# Patient Record
Sex: Female | Born: 1935
Health system: Southern US, Community
[De-identification: ages and names within clinical notes are randomized; demographics above are authoritative.]

## PROBLEM LIST (undated history)

## (undated) DIAGNOSIS — T8859XA Other complications of anesthesia, initial encounter: Secondary | ICD-10-CM

## (undated) DIAGNOSIS — G459 Transient cerebral ischemic attack, unspecified: Secondary | ICD-10-CM

## (undated) DIAGNOSIS — K219 Gastro-esophageal reflux disease without esophagitis: Secondary | ICD-10-CM

## (undated) DIAGNOSIS — H409 Unspecified glaucoma: Secondary | ICD-10-CM

## (undated) DIAGNOSIS — I10 Essential (primary) hypertension: Secondary | ICD-10-CM

## (undated) DIAGNOSIS — T4145XA Adverse effect of unspecified anesthetic, initial encounter: Secondary | ICD-10-CM

## (undated) DIAGNOSIS — H269 Unspecified cataract: Secondary | ICD-10-CM

## (undated) DIAGNOSIS — I639 Cerebral infarction, unspecified: Secondary | ICD-10-CM

## (undated) DIAGNOSIS — M1990 Unspecified osteoarthritis, unspecified site: Secondary | ICD-10-CM

## (undated) DIAGNOSIS — E119 Type 2 diabetes mellitus without complications: Secondary | ICD-10-CM

## (undated) HISTORY — DX: Transient cerebral ischemic attack, unspecified: G45.9

## (undated) HISTORY — PX: FRACTURE SURGERY: SHX138

## (undated) HISTORY — DX: Cerebral infarction, unspecified: I63.9

## (undated) HISTORY — PX: APPENDECTOMY: SHX54

---

## 1971-03-28 HISTORY — PX: VAGINAL HYSTERECTOMY: SUR661

## 1998-03-27 HISTORY — PX: ANKLE FRACTURE SURGERY: SHX122

## 2013-04-09 ENCOUNTER — Emergency Department: Payer: Self-pay | Admitting: Emergency Medicine

## 2013-04-09 LAB — URINALYSIS, COMPLETE
BACTERIA: NONE SEEN
BLOOD: NEGATIVE
Bilirubin,UR: NEGATIVE
Glucose,UR: 150 mg/dL (ref 0–75)
Ketone: NEGATIVE
NITRITE: NEGATIVE
Ph: 6 (ref 4.5–8.0)
Protein: NEGATIVE
Specific Gravity: 1.009 (ref 1.003–1.030)
Squamous Epithelial: 2
Transitional Epi: <1
WBC UR: 3 /HPF (ref 0–5)

## 2013-04-09 LAB — CBC
HCT: 44.4 % (ref 35.0–47.0)
HGB: 14.9 g/dL (ref 12.0–16.0)
MCH: 29.6 pg (ref 26.0–34.0)
MCHC: 33.5 g/dL (ref 32.0–36.0)
MCV: 89 fL (ref 80–100)
Platelet: 184 10*3/uL (ref 150–440)
RBC: 5.02 10*6/uL (ref 3.80–5.20)
RDW: 13.5 % (ref 11.5–14.5)
WBC: 4.9 10*3/uL (ref 3.6–11.0)

## 2013-04-09 LAB — COMPREHENSIVE METABOLIC PANEL
ALT: 21 U/L (ref 12–78)
ANION GAP: 4 — AB (ref 7–16)
AST: 28 U/L (ref 15–37)
Albumin: 4 g/dL (ref 3.4–5.0)
Alkaline Phosphatase: 74 U/L
BUN: 14 mg/dL (ref 7–18)
Bilirubin,Total: 0.5 mg/dL (ref 0.2–1.0)
CHLORIDE: 103 mmol/L (ref 98–107)
Calcium, Total: 9.2 mg/dL (ref 8.5–10.1)
Co2: 28 mmol/L (ref 21–32)
Creatinine: 0.87 mg/dL (ref 0.60–1.30)
EGFR (Non-African Amer.): >60
GLUCOSE: 211 mg/dL — AB (ref 65–99)
Osmolality: 277 (ref 275–301)
Potassium: 3.8 mmol/L (ref 3.5–5.1)
Sodium: 135 mmol/L — ABNORMAL LOW (ref 136–145)
Total Protein: 8.1 g/dL (ref 6.4–8.2)

## 2013-04-09 LAB — TROPONIN I

## 2015-11-04 ENCOUNTER — Emergency Department: Payer: Medicare Other

## 2015-11-04 ENCOUNTER — Inpatient Hospital Stay (HOSPITAL_COMMUNITY)
Admission: EM | Admit: 2015-11-04 | Discharge: 2015-11-08 | DRG: 065 | Disposition: A | Discharge status: Skilled Nursing Facility | Payer: Medicare Other | Attending: Internal Medicine | Admitting: Internal Medicine

## 2015-11-04 ENCOUNTER — Observation Stay (HOSPITAL_COMMUNITY): Payer: Medicare Other

## 2015-11-04 ENCOUNTER — Encounter (HOSPITAL_COMMUNITY): Payer: Self-pay | Admitting: Emergency Medicine

## 2015-11-04 ENCOUNTER — Emergency Department
Admission: EM | Admit: 2015-11-04 | Discharge: 2015-11-04 | Payer: Medicare Other | Attending: Emergency Medicine | Admitting: Emergency Medicine

## 2015-11-04 DIAGNOSIS — I639 Cerebral infarction, unspecified: Principal | ICD-10-CM

## 2015-11-04 DIAGNOSIS — Z888 Allergy status to other drugs, medicaments and biological substances status: Secondary | ICD-10-CM

## 2015-11-04 DIAGNOSIS — H409 Unspecified glaucoma: Secondary | ICD-10-CM | POA: Diagnosis present

## 2015-11-04 DIAGNOSIS — I10 Essential (primary) hypertension: Secondary | ICD-10-CM | POA: Diagnosis not present

## 2015-11-04 DIAGNOSIS — G8194 Hemiplegia, unspecified affecting left nondominant side: Secondary | ICD-10-CM | POA: Diagnosis not present

## 2015-11-04 DIAGNOSIS — G459 Transient cerebral ischemic attack, unspecified: Secondary | ICD-10-CM | POA: Diagnosis not present

## 2015-11-04 DIAGNOSIS — Z833 Family history of diabetes mellitus: Secondary | ICD-10-CM

## 2015-11-04 DIAGNOSIS — E119 Type 2 diabetes mellitus without complications: Secondary | ICD-10-CM

## 2015-11-04 DIAGNOSIS — Z8673 Personal history of transient ischemic attack (TIA), and cerebral infarction without residual deficits: Secondary | ICD-10-CM | POA: Diagnosis not present

## 2015-11-04 DIAGNOSIS — E785 Hyperlipidemia, unspecified: Secondary | ICD-10-CM | POA: Diagnosis not present

## 2015-11-04 DIAGNOSIS — R270 Ataxia, unspecified: Secondary | ICD-10-CM | POA: Diagnosis present

## 2015-11-04 DIAGNOSIS — R471 Dysarthria and anarthria: Secondary | ICD-10-CM | POA: Diagnosis present

## 2015-11-04 DIAGNOSIS — R2990 Unspecified symptoms and signs involving the nervous system: Secondary | ICD-10-CM

## 2015-11-04 DIAGNOSIS — Z8249 Family history of ischemic heart disease and other diseases of the circulatory system: Secondary | ICD-10-CM

## 2015-11-04 DIAGNOSIS — Z9119 Patient's noncompliance with other medical treatment and regimen: Secondary | ICD-10-CM | POA: Diagnosis not present

## 2015-11-04 DIAGNOSIS — I160 Hypertensive urgency: Secondary | ICD-10-CM | POA: Diagnosis present

## 2015-11-04 DIAGNOSIS — R001 Bradycardia, unspecified: Secondary | ICD-10-CM | POA: Diagnosis not present

## 2015-11-04 DIAGNOSIS — R200 Anesthesia of skin: Secondary | ICD-10-CM | POA: Diagnosis present

## 2015-11-04 DIAGNOSIS — Z885 Allergy status to narcotic agent status: Secondary | ICD-10-CM

## 2015-11-04 DIAGNOSIS — I6930 Unspecified sequelae of cerebral infarction: Secondary | ICD-10-CM | POA: Insufficient documentation

## 2015-11-04 HISTORY — DX: Unspecified osteoarthritis, unspecified site: M19.90

## 2015-11-04 HISTORY — DX: Other complications of anesthesia, initial encounter: T88.59XA

## 2015-11-04 HISTORY — DX: Unspecified glaucoma: H40.9

## 2015-11-04 HISTORY — DX: Transient cerebral ischemic attack, unspecified: G45.9

## 2015-11-04 HISTORY — DX: Gastro-esophageal reflux disease without esophagitis: K21.9

## 2015-11-04 HISTORY — DX: Type 2 diabetes mellitus without complications: E11.9

## 2015-11-04 HISTORY — DX: Unspecified cataract: H26.9

## 2015-11-04 HISTORY — DX: Adverse effect of unspecified anesthetic, initial encounter: T41.45XA

## 2015-11-04 HISTORY — DX: Essential (primary) hypertension: I10

## 2015-11-04 LAB — CBC WITH DIFFERENTIAL/PLATELET
Basophils Absolute: 0 10*3/uL (ref 0–0.1)
Basophils Relative: 1 %
EOS PCT: 2 %
Eosinophils Absolute: 0.1 10*3/uL (ref 0–0.7)
HEMATOCRIT: 44.9 % (ref 35.0–47.0)
Hemoglobin: 15.4 g/dL (ref 12.0–16.0)
Lymphocytes Relative: 24 %
Lymphs Abs: 1.5 10*3/uL (ref 1.0–3.6)
MCH: 30.1 pg (ref 26.0–34.0)
MCHC: 34.3 g/dL (ref 32.0–36.0)
MCV: 87.9 fL (ref 80.0–100.0)
MONO ABS: 0.5 10*3/uL (ref 0.2–0.9)
Monocytes Relative: 9 %
Neutro Abs: 4 10*3/uL (ref 1.4–6.5)
Neutrophils Relative %: 64 %
PLATELETS: 165 10*3/uL (ref 150–440)
RBC: 5.11 MIL/uL (ref 3.80–5.20)
RDW: 14 % (ref 11.5–14.5)
WBC: 6.2 10*3/uL (ref 3.6–11.0)

## 2015-11-04 LAB — COMPREHENSIVE METABOLIC PANEL
ALT: 15 U/L (ref 14–54)
AST: 20 U/L (ref 15–41)
Albumin: 4.3 g/dL (ref 3.5–5.0)
Alkaline Phosphatase: 72 U/L (ref 38–126)
Anion gap: 6 (ref 5–15)
BILIRUBIN TOTAL: 0.9 mg/dL (ref 0.3–1.2)
BUN: 13 mg/dL (ref 6–20)
CHLORIDE: 103 mmol/L (ref 101–111)
CO2: 30 mmol/L (ref 22–32)
CREATININE: 0.85 mg/dL (ref 0.44–1.00)
Calcium: 9.8 mg/dL (ref 8.9–10.3)
Glucose, Bld: 199 mg/dL — ABNORMAL HIGH (ref 65–99)
Potassium: 5 mmol/L (ref 3.5–5.1)
Sodium: 139 mmol/L (ref 135–145)
TOTAL PROTEIN: 7.9 g/dL (ref 6.5–8.1)

## 2015-11-04 LAB — PROTIME-INR
INR: 1.01
Prothrombin Time: 13.3 seconds (ref 11.4–15.2)

## 2015-11-04 LAB — ETHANOL: Alcohol, Ethyl (B): <5 mg/dL (ref <5)

## 2015-11-04 LAB — GLUCOSE, CAPILLARY
GLUCOSE-CAPILLARY: 148 mg/dL — AB (ref 65–99)
Glucose-Capillary: 159 mg/dL — ABNORMAL HIGH (ref 65–99)
Glucose-Capillary: 174 mg/dL — ABNORMAL HIGH (ref 65–99)

## 2015-11-04 LAB — APTT: APTT: 31 s (ref 24–36)

## 2015-11-04 LAB — I-STAT CHEM 8, ED
BUN: 10 mg/dL (ref 6–20)
CREATININE: 0.7 mg/dL (ref 0.44–1.00)
Calcium, Ion: 1.16 mmol/L (ref 1.12–1.23)
Chloride: 102 mmol/L (ref 101–111)
Glucose, Bld: 191 mg/dL — ABNORMAL HIGH (ref 65–99)
HEMATOCRIT: 45 % (ref 36.0–46.0)
Hemoglobin: 15.3 g/dL — ABNORMAL HIGH (ref 12.0–15.0)
POTASSIUM: 3.9 mmol/L (ref 3.5–5.1)
Sodium: 140 mmol/L (ref 135–145)
TCO2: 26 mmol/L (ref 0–100)

## 2015-11-04 LAB — I-STAT TROPONIN, ED: Troponin i, poc: 0.01 ng/mL (ref 0.00–0.08)

## 2015-11-04 MED ORDER — LABETALOL HCL 5 MG/ML IV SOLN
40.0000 mg | Freq: Once | INTRAVENOUS | Status: AC
  Administered 2015-11-04: 40 mg via INTRAVENOUS

## 2015-11-04 MED ORDER — STROKE: EARLY STAGES OF RECOVERY BOOK
Freq: Once | Status: AC
  Administered 2015-11-05: 17:00:00
  Filled 2015-11-04 (×2): qty 1

## 2015-11-04 MED ORDER — ASPIRIN 81 MG PO CHEW
CHEWABLE_TABLET | ORAL | Status: AC
  Administered 2015-11-04: 324 mg via ORAL
  Filled 2015-11-04: qty 4

## 2015-11-04 MED ORDER — ACETAMINOPHEN 650 MG RE SUPP: 650.0000 mg | RECTAL | Status: DC | PRN

## 2015-11-04 MED ORDER — LABETALOL HCL 5 MG/ML IV SOLN
INTRAVENOUS | Status: AC
  Administered 2015-11-04: 40 mg via INTRAVENOUS
  Filled 2015-11-04: qty 8

## 2015-11-04 MED ORDER — ENOXAPARIN SODIUM 40 MG/0.4ML ~~LOC~~ SOLN
40.0000 mg | SUBCUTANEOUS | Status: DC
  Administered 2015-11-04 – 2015-11-07 (×4): 40 mg via SUBCUTANEOUS
  Filled 2015-11-04 (×4): qty 0.4

## 2015-11-04 MED ORDER — INSULIN ASPART 100 UNIT/ML ~~LOC~~ SOLN: 0.0000 [IU] | Freq: Every day | SUBCUTANEOUS | Status: DC

## 2015-11-04 MED ORDER — INSULIN ASPART 100 UNIT/ML ~~LOC~~ SOLN
0.0000 [IU] | Freq: Three times a day (TID) | SUBCUTANEOUS | Status: DC
  Administered 2015-11-04 – 2015-11-06 (×5): 3 [IU] via SUBCUTANEOUS
  Administered 2015-11-06: 1 [IU] via SUBCUTANEOUS
  Administered 2015-11-07 (×3): 2 [IU] via SUBCUTANEOUS
  Administered 2015-11-08 (×2): 3 [IU] via SUBCUTANEOUS
  Administered 2015-11-08: 2 [IU] via SUBCUTANEOUS

## 2015-11-04 MED ORDER — HYDRALAZINE HCL 20 MG/ML IJ SOLN: 10.0000 mg | INTRAMUSCULAR | Status: DC | PRN

## 2015-11-04 MED ORDER — ASPIRIN 325 MG PO TABS: 325.0000 mg | ORAL_TABLET | Freq: Every day | ORAL | Status: DC

## 2015-11-04 MED ORDER — ACETAMINOPHEN 325 MG PO TABS
650.0000 mg | ORAL_TABLET | ORAL | Status: DC | PRN
  Administered 2015-11-05: 650 mg via ORAL
  Filled 2015-11-04: qty 2

## 2015-11-04 MED ORDER — SENNOSIDES-DOCUSATE SODIUM 8.6-50 MG PO TABS: 1.0000 | ORAL_TABLET | Freq: Every evening | ORAL | Status: DC | PRN

## 2015-11-04 MED ORDER — LABETALOL HCL 5 MG/ML IV SOLN
80.0000 mg | Freq: Once | INTRAVENOUS | Status: AC
  Administered 2015-11-04: 80 mg via INTRAVENOUS
  Filled 2015-11-04: qty 16

## 2015-11-04 MED ORDER — LABETALOL HCL 5 MG/ML IV SOLN
INTRAVENOUS | Status: AC
  Administered 2015-11-04: 20 mg
  Filled 2015-11-04: qty 4

## 2015-11-04 MED ORDER — ASPIRIN 81 MG PO CHEW
324.0000 mg | CHEWABLE_TABLET | Freq: Once | ORAL | Status: AC
  Administered 2015-11-04: 324 mg via ORAL

## 2015-11-04 MED ORDER — IOPAMIDOL (ISOVUE-370) INJECTION 76%
75.0000 mL | Freq: Once | INTRAVENOUS | Status: AC | PRN
  Administered 2015-11-04: 75 mL via INTRAVENOUS

## 2015-11-04 NOTE — ED Triage Notes (Addendum)
Pt comes into the ED via EMS from home with c/o left arm numbness since initially waking at 430am to go to the BR, states she felt fine when she went to bed last night at 1130pm.. Pt hypertensive with EMS 211/110, CBG 168.Marland Kitchen. Pt also c/o having difficulty walking "staggering"..Marland Kitchen

## 2015-11-04 NOTE — ED Notes (Signed)
Admitting at bedside 

## 2015-11-04 NOTE — ED Notes (Signed)
[facility]

## 2015-11-04 NOTE — Code Documentation (Signed)
82yo [facility] via [facility] as a transfer from [facility].  Patient was at her baseline when she went to bed last night at 2300.  Patient woke up at 0430 to use the bathroom when she had unsteady gait and left sided weakness.  [facility] which showed proximal right PCA occlusion and a chronic appearing right occipital pole infarct.  [facility]  Stroke team at the bedside on patient arrival.  NIHSS 0, see documentation for details and code stroke times.  Patient reporting left neck numbness.  No acute stroke intervention at this time.  Handoff with ED RN Tammy SoursGreg.

## 2015-11-04 NOTE — ED Provider Notes (Signed)
[facility]-EMERGENCY DEPT Provider Note   CSN: [id number] Arrival date & time: 11/04/15  1229  First Provider Contact:  First MD Initiated Contact with Patient 11/04/15 1237        History   Chief Complaint Chief Complaint  Patient presents with  . Stroke Symptoms    HPI Margaret Mcintosh is a 82 y.o. female.  HPI 82 year old female with past medical history of hypertension and diabetes who presents as a transfer from outside hospital for evaluation of stroke. The patient states her last known normal was last night when she went to bed. When she woke this morning she had difficulty walking to the bathroom due to left arm and leg weakness. [facility] these [facility]. At [facility], the patient underwent CT and CTA which showed a likely chronic PCA occlusion. She was subsequent send here for evaluation. Of note, it is unclear whether neurology was consulted prior to transfer. Currently, the patient denies any complaints. Her weakness in her arm and leg have improved. She denies any visual changes. Denies any recent trauma. No recent fevers or chills. Denies any chest pain  Past Medical History:  Diagnosis Date  . Arthritis   . Cataract   . Diabetes mellitus (HCC)   . Glaucoma   . Hypertension     Patient Active Problem List   Diagnosis Date Noted  . TIA (transient ischemic attack) 11/04/2015  . Stroke-like symptoms 11/04/2015  . Diabetes (HCC) 11/04/2015  . Bradycardia 11/04/2015  . Hypertension 11/04/2015  . Acute CVA (cerebrovascular accident) (HCC)   . Accelerated hypertension     Past Surgical History:  Procedure Laterality Date  . ABDOMINAL HYSTERECTOMY    . APPENDECTOMY    . FRACTURE SURGERY      OB History    No data available       Home Medications    Prior to Admission medications   Medication Sig Start Date End Date Taking? Authorizing Provider  acetaminophen (TYLENOL) 325 MG tablet Take 325-650 mg by mouth every 6 (six) hours as  needed for mild pain.   Yes Historical Provider, MD  ibuprofen (ADVIL,MOTRIN) 400 MG tablet Take 400 mg by mouth every 6 (six) hours as needed for mild pain.   Yes Historical Provider, MD    Family History Family History  Problem Relation Age of Onset  . Hypertension Mother   . Hypertension Father     Social History Social History  Substance Use Topics  . Smoking status: Never Smoker  . Smokeless tobacco: Never Used  . Alcohol use No     Allergies   Codeine; Diphenhydramine hcl; and Other   Review of Systems Review of Systems  Constitutional: Negative for chills, fatigue and fever.  HENT: Negative for congestion and rhinorrhea.   Eyes: Negative for visual disturbance.  Respiratory: Negative for cough, shortness of breath and wheezing.   Cardiovascular: Negative for chest pain and leg swelling.  Gastrointestinal: Negative for abdominal pain, diarrhea, nausea and vomiting.  Genitourinary: Negative for dysuria and flank pain.  Musculoskeletal: Positive for gait problem. Negative for neck pain and neck stiffness.  Skin: Negative for rash and wound.  Allergic/Immunologic: Negative for immunocompromised state.  Neurological: Positive for weakness. Negative for syncope and headaches.     Physical Exam Updated Vital Signs BP (!) 183/63 (BP Location: Left Arm)   Pulse (!) 58   Temp 98.2 F (36.8 C) (Oral)   Resp 20   SpO2 96%   Physical Exam  Constitutional: She is oriented to person, place, and time. She appears well-developed and well-nourished. No distress.  HENT:  Head: Normocephalic and atraumatic.  Mouth/Throat: Oropharynx is clear and moist.  Eyes: Conjunctivae are normal. Pupils are equal, round, and reactive to light.  Neck: Neck supple.  Cardiovascular: Normal rate, regular rhythm and normal heart sounds.  Exam reveals no friction rub.   No murmur heard. Pulmonary/Chest: Effort normal and breath sounds normal. No respiratory distress. She has no wheezes. She  has no rales.  Abdominal: She exhibits no distension. There is no tenderness.  Musculoskeletal: She exhibits no edema.  Neurological: She is alert and oriented to person, place, and time. She exhibits normal muscle tone.  Skin: Skin is warm. Capillary refill takes less than 2 seconds.  Nursing note and vitals reviewed.   Neurological Exam:  Mental Status: Alert and oriented to person, place, and time. Attention and concentration normal. Speech clear. Recent memory is intact. Cranial Nerves: Visual fields intact to confrontation in all quadrants bilaterally. EOMI and PERRLA. No nystagmus noted. Facial sensation intact at forehead, maxillary cheek, and chin/mandible bilaterally. No weakness of masticatory muscles. No facial asymmetry or weakness. Hearing grossly normal to finer rub. Uvula is midline, and palate elevates symmetrically. Normal SCM and trapezius strength. Tongue midline without fasciculations Motor: Muscle strength 5/5 in proximal and distal UE and LE bilaterally. No pronator drift. Muscle tone normal. Reflexes: 2+ and symmetrical in all four extremities.  Sensation: Intact to light touch in upper and lower extremities distally bilaterally.  Gait: Normal without ataxia. Coordination: Normal FTN bilaterally.     ED Treatments / Results  Labs (all labs ordered are listed, but only abnormal results are displayed) Labs Reviewed  GLUCOSE, CAPILLARY - Abnormal; Notable for the following:       Result Value   Glucose-Capillary 159 (*)    All other components within normal limits  I-STAT CHEM 8, ED - Abnormal; Notable for the following:    Glucose, Bld 191 (*)    Hemoglobin 15.3 (*)    All other components within normal limits  ETHANOL  PROTIME-INR  APTT  URINE RAPID DRUG SCREEN, HOSP PERFORMED  URINALYSIS, ROUTINE W REFLEX MICROSCOPIC (NOT AT [facility])  HEMOGLOBIN A1C  LIPID PANEL  I-STAT TROPOININ, ED    EKG  EKG Interpretation  Date/Time:  Thursday November 04 2015  12:47:58 EDT Ventricular Rate:  63 PR Interval:    QRS Duration: 84 QT Interval:  445 QTC Calculation: 456 R Axis:   36 Text Interpretation:  Sinus rhythm No significant change since last tracing Confirmed by Michale Emmerich MD, Sheria Lang [phone]) on 11/04/2015 12:51:58 PM Also confirmed by Erma Heritage MD, Keymani Mclean [phone]), editor Stout CT, Jola Babinski [phone])  on 11/04/2015 1:15:09 PM       Radiology Ct Angio Head W Or Wo Contrast  Result Date: 11/04/2015 CLINICAL DATA:  Left-sided numbness. EXAM: CT ANGIOGRAPHY HEAD AND NECK TECHNIQUE: Multidetector CT imaging of the head and neck was performed using the standard protocol during bolus administration of intravenous contrast. Multiplanar CT image reconstructions and MIPs were obtained to evaluate the vascular anatomy. Carotid stenosis measurements (when applicable) are obtained utilizing NASCET criteria, using the distal internal carotid diameter as the denominator. CONTRAST:  75 cc Isovue 370 intravenous COMPARISON:  Noncontrast head CT from earlier today FINDINGS: CTA NECK Aortic arch: Atheromatous calcifications. No aneurysm or dissection. 3 vessel branching. Right carotid system: Mixed calcified and noncalcified plaque at the bifurcation with 20% maximal proximal ICA stenosis. Left carotid system: Atherosclerotic  calcification primarily at the common carotid bifurcation which is mixed calcified and low-density. Stenosis measures up to 50% when measured coronally at the ICA origin. There is a low-density luminal filling defect along the posterior and lateral walls which appears to project into the lumen, as seen with thrombus. No evidence of dissection. Vertebral arteries:No proximal subclavian artery stenosis. Smooth and widely patent vertebral arteries with mild dominance on the left. Skeleton: No acute or aggressive process. Cervical disc and facet degeneration. Other neck: No incidental mass or adenopathy. Upper chest: Clear apical lungs CTA HEAD Anterior circulation:  Carotid siphon atherosclerotic luminal irregularity and calcification. Mild to moderate right mid cavernous segment narrowing. No major branch occlusion. There is diffuse atherosclerotic irregularity of medium size vessels. Negative for aneurysm. Posterior circulation: Mild left vertebral artery dominance. Standard vertebrobasilar branching. There is a critical right P1 segment stenosis with occluded PCA near the P1 2 junction (no post communicating artery). Negative for aneurysm. Venous sinuses: Patent Anatomic variants: Anterior communicating artery. No visible posterior communicating arteries. Delayed phase: Remote appearing right occipital pole infarct. As noted previously there may be more recent marginal ischemia. No visible thalamic infarct. Likely chronic pontomidbrain junction lacune. These results were called by telephone at the time of interpretation on 11/04/2015 at 10:36 am to Dr. Lacretia Nicks , who verbally acknowledged these results. IMPRESSION: 1. Proximal right PCA occlusion. There is a chronic appearing right occipital pole infarct; the level of occlusion puts a much greater area at risk. 2. Cervical carotid atherosclerosis with proximal ICA stenosis of approximately 20% on the right and 50% on the left. Low-density thrombus or irregular plaque projects into the proximal left ICA lumen. 3. Diffuse intracranial atherosclerosis. Electronically Signed   By: Marnee Spring M.D.   On: 11/04/2015 10:45   Dg Chest 2 View  Result Date: 11/04/2015 CLINICAL DATA:  TIA, left-sided weakness EXAM: CHEST  2 VIEW COMPARISON:  None. FINDINGS: Cardiomediastinal silhouette is unremarkable. No acute infiltrate or pleural effusion. No pulmonary edema. Atherosclerotic calcifications of carotid siphon. Degenerative changes mid and lower thoracic spine. IMPRESSION: No active cardiopulmonary disease. Electronically Signed   By: Natasha Mead M.D.   On: 11/04/2015 14:30   Ct Head Wo Contrast  Result Date:  11/04/2015 CLINICAL DATA:  Code stroke EXAM: CT HEAD WITHOUT CONTRAST TECHNIQUE: Contiguous axial images were obtained from the base of the skull through the vertex without intravenous contrast. COMPARISON:  04/09/2013 FINDINGS: No skull fracture is noted. Paranasal sinuses and mastoid air cells are unremarkable. No intracranial hemorrhage, mass effect or midline shift. Mild cerebral atrophy. Mild periventricular white matter decreased attenuation probable due to chronic small vessel ischemic changes. Bilateral basal ganglia punctate calcifications are again noted. There is interval old appearing infarct in right occipital lobe axial image 17 measures 1.6 cm. Axial image 15 there is small area of decreased attenuation in right occipital lobe. New ischemia cannot be excluded. Clinical correlation is necessary. Further correlation with MRI is recommended as clinically warranted. IMPRESSION: There is interval old appearing infarct in right occipital lobe axial image 17 measures 1.6 cm. Axial image 15 there is small area of decreased attenuation in right occipital lobe. New ischemia cannot be excluded. Clinical correlation is necessary. Further correlation with MRI is recommended as clinically warranted. No intracranial hemorrhage, mass effect or midline shift. These results were called by telephone at the time of interpretation on 11/04/2015 at 9:01 am to Dr. Lacretia Nicks , who verbally acknowledged these results. Electronically Signed   By: Lang Snow  Pop M.D.   On: 11/04/2015 09:02   Ct Angio Neck W And/or Wo Contrast  Result Date: 11/04/2015 CLINICAL DATA:  Left-sided numbness. EXAM: CT ANGIOGRAPHY HEAD AND NECK TECHNIQUE: Multidetector CT imaging of the head and neck was performed using the standard protocol during bolus administration of intravenous contrast. Multiplanar CT image reconstructions and MIPs were obtained to evaluate the vascular anatomy. Carotid stenosis measurements (when applicable) are obtained  utilizing NASCET criteria, using the distal internal carotid diameter as the denominator. CONTRAST:  75 cc Isovue 370 intravenous COMPARISON:  Noncontrast head CT from earlier today FINDINGS: CTA NECK Aortic arch: Atheromatous calcifications. No aneurysm or dissection. 3 vessel branching. Right carotid system: Mixed calcified and noncalcified plaque at the bifurcation with 20% maximal proximal ICA stenosis. Left carotid system: Atherosclerotic calcification primarily at the common carotid bifurcation which is mixed calcified and low-density. Stenosis measures up to 50% when measured coronally at the ICA origin. There is a low-density luminal filling defect along the posterior and lateral walls which appears to project into the lumen, as seen with thrombus. No evidence of dissection. Vertebral arteries:No proximal subclavian artery stenosis. Smooth and widely patent vertebral arteries with mild dominance on the left. Skeleton: No acute or aggressive process. Cervical disc and facet degeneration. Other neck: No incidental mass or adenopathy. Upper chest: Clear apical lungs CTA HEAD Anterior circulation: Carotid siphon atherosclerotic luminal irregularity and calcification. Mild to moderate right mid cavernous segment narrowing. No major branch occlusion. There is diffuse atherosclerotic irregularity of medium size vessels. Negative for aneurysm. Posterior circulation: Mild left vertebral artery dominance. Standard vertebrobasilar branching. There is a critical right P1 segment stenosis with occluded PCA near the P1 2 junction (no post communicating artery). Negative for aneurysm. Venous sinuses: Patent Anatomic variants: Anterior communicating artery. No visible posterior communicating arteries. Delayed phase: Remote appearing right occipital pole infarct. As noted previously there may be more recent marginal ischemia. No visible thalamic infarct. Likely chronic pontomidbrain junction lacune. These results were called  by telephone at the time of interpretation on 11/04/2015 at 10:36 am to Dr. Lacretia Nicks , who verbally acknowledged these results. IMPRESSION: 1. Proximal right PCA occlusion. There is a chronic appearing right occipital pole infarct; the level of occlusion puts a much greater area at risk. 2. Cervical carotid atherosclerosis with proximal ICA stenosis of approximately 20% on the right and 50% on the left. Low-density thrombus or irregular plaque projects into the proximal left ICA lumen. 3. Diffuse intracranial atherosclerosis. Electronically Signed   By: Marnee Spring M.D.   On: 11/04/2015 10:45    Procedures Procedures (including critical care time)  Medications Ordered in ED Medications   stroke: mapping our early stages of recovery book (not administered)  senna-docusate (Senokot-S) tablet 1 tablet (not administered)  enoxaparin (LOVENOX) injection 40 mg (not administered)  acetaminophen (TYLENOL) tablet 650 mg (not administered)    Or  acetaminophen (TYLENOL) suppository 650 mg (not administered)  insulin aspart (novoLOG) injection 0-15 Units (not administered)  insulin aspart (novoLOG) injection 0-5 Units (not administered)  aspirin tablet 325 mg (0 mg Oral Hold 11/04/15 1435)     Initial Impression / Assessment and Plan / ED Course  I have reviewed the triage vital signs and the nursing notes.  Pertinent labs & imaging results that were available during my care of the patient were reviewed by me and considered in my medical decision making (see chart for details).  Clinical Course   82 year old female with past medical history of hypertension and  diabetes who presents as a transfer from outside hospital for evaluation of possible stroke. On arrival, pt hypertensive but o/w well-appearing, with resolution of neurological sx. Stroke team and Dr. Hilda Blades at bedside and do not recommend repeat imaging/activation of CODE STROKE. Pt is outside intervention window and has  resolving/resolved sx. Will admit to Hospitalist for tele, stroke work-up and further management. BP now improved. Pt in agreement. Screening labwork sent and is otherwise unremarkable.  Final Clinical Impressions(s) / ED Diagnoses   Final diagnoses:  TIA (transient ischemic attack)  TIA (transient ischemic attack)    New Prescriptions Current Discharge Medication List       Shaune Pollack, MD 11/04/15 1806

## 2015-11-04 NOTE — Progress Notes (Signed)
Patient off floor. Will come back once patient in room

## 2015-11-04 NOTE — ED Notes (Signed)
Stroke team at bedside

## 2015-11-04 NOTE — H&P (Signed)
History and Physical    Margaret Mcintosh ZOX:[id number] DOB: [birth year] DOA: 11/04/2015  PCP: No PCP Per Patient Patient coming from: [place] [facility]/home  Chief Complaint: left sided weakness/gait disturbance  HPI: Margaret Mcintosh is a very pleasnat 82 y.o. female with medical history significant hypertension, diabetes, presents to the emergency Department chief complaint left-sided weakness/gait disturbance. She was transported here from [facility] where initial evaluation concerning for stroke/TIA.  Information is obtained from the patient and the chart. She reports being in her usual state of health last night around 11 PM when she went to bed. She awakened around 4:30 this morning to go to the restroom and noticed she was "off balance" and veering off to the left. She reports she "staggered" back to bed and awakened around 7 AM. She began to take a shower and noted she remained off balance and also noted that her left arm was quite weak. She reports having difficulty cleaning her ear is impression of her teeth. Associated symptoms include left leg "heaviness". She denies headache visual disturbances numbness tingling of extremities. She denies any difficulty chewing or swallowing. She denies chest pain palpitation shortness of breath diaphoresis nausea vomiting. She denies any dysuria hematuria frequency or urgency. She denies any recent travel fever chills or sick contacts. She reports her arm continues to feel a little "heavy" but otherwise she feels like symptoms have resolved  At [facility] code stroke protocol initiated with head CT showing no evidence of hemorrhagic stroke. CT angiogram of the head showed what appeared to be a stricture or obstruction at the posterior communicating artery. Seems to be some confusion whether this was acute or chronic per notes. Case discussed with interventional radiology at [facility] as well as ED at time he was decided [facility]up. It was noted she is not a TPA candidate. By the time she arrived at [facility] her symptoms much improved  ED Course: She is afebrile hemodynamically stable and not hypoxic.  Review of Systems: As per HPI otherwise 10 point review of systems negative.   Ambulatory Status: With steady gait independently no recent falls  Past Medical History:  Diagnosis Date  . Arthritis   . Cataract   . Diabetes mellitus (HCC)   . Glaucoma   . Hypertension     Past Surgical History:  Procedure Laterality Date  . ABDOMINAL HYSTERECTOMY    . APPENDECTOMY    . FRACTURE SURGERY      Social History   Social History  . Marital status: Married    Spouse name: N/A  . Number of children: N/A  . Years of education: N/A   Occupational History  . Not on file.   Social History Main Topics  . Smoking status: Never Smoker  . Smokeless tobacco: Never Used  . Alcohol use No  . Drug use: No  . Sexual activity: Not on file   Other Topics Concern  . Not on file   Social History Narrative  . 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 Codeine Other (See Comments)    Gets nausea and feels like"something is crawling"  . Diphenhydramine Hcl Other (See Comments)    Feels like skin crawling    Family History  Problem Relation Age of Onset  . Hypertension Mother   . Hypertension Father  She has 7 siblings whose collective medical history is positive for diabetes, hypertension, oral cancer, CAD Prior to Admission medications   Not on File    Physical Exam: Vitals:   11/04/15 1248 11/04/15 1300 11/04/15 1305 11/04/15 1315  BP: 189/77 178/84  156/62  Pulse: 66 64  (!) 56  Resp: 18 16  19   Temp:  98.3 F (36.8 C) 98.3 F (36.8 C)   TempSrc:  Oral    SpO2: 96% 97%  96%     General:  Appears calm and comfortable Eyes:  PERRL, EOMI, normal lids, iris ENT:   grossly normal hearing, lips & tongue, Because membranes of her mouth are pink slightly dry Neck:  no LAD, masses or thyromegaly Cardiovascular:  RRR, no m/r/g. No LE edema.  Respiratory:  CTA bilaterally, no w/r/r. Normal respiratory effort. Abdomen:  soft, ntnd, positive bowel sounds throughout no guarding or rebounding Skin:  no rash or induration seen on limited exam Musculoskeletal:  grossly normal tone BUE/BLE, good ROM, no bony abnormality Psychiatric:  grossly normal mood and affect, speech fluent and appropriate, AOx3 Neurologic:  CN 2-12 grossly intact, moves all extremities in coordinated fashion, sensation intact. Speech clear facial symmetry follows commands bilateral upper extremity strength 5 out of 5 bilateral lower extremity strength 5 out of 5  Labs on Admission: I have personally reviewed following labs and imaging studies  CBC:  Recent Labs Lab 11/04/15 0839 11/04/15 1336  WBC 6.2  --   NEUTROABS 4.0  --   HGB 15.4 15.3*  HCT 44.9 45.0  MCV 87.9  --   PLT 165  --    Basic Metabolic Panel:  Recent Labs Lab 11/04/15 0912 11/04/15 1336  NA 139 140  K 5.0 3.9  CL 103 102  CO2 30  --   GLUCOSE 199* 191*  BUN 13 10  CREATININE 0.85 0.70  CALCIUM 9.8  --    GFR: Estimated Creatinine Clearance: 60 mL/min (by C-G formula based on SCr of 0.8 mg/dL). Liver Function Tests:  Recent Labs Lab 11/04/15 0912  AST 20  ALT 15  ALKPHOS 72  BILITOT 0.9  PROT 7.9  ALBUMIN 4.3   No results for input(s): LIPASE, AMYLASE in the last 168 hours. No results for input(s): AMMONIA in the last 168 hours. Coagulation Profile: No results for input(s): INR, PROTIME in the last 168 hours. Cardiac Enzymes: No results for input(s): CKTOTAL, CKMB, CKMBINDEX, TROPONINI in the last 168 hours. BNP (last 3 results) No results for input(s): PROBNP in the last 8760 hours. HbA1C: No results for input(s): HGBA1C in the last 72 hours. CBG:  Recent Labs Lab 11/04/15 0839    GLUCAP 148*   Lipid Profile: No results for input(s): CHOL, HDL, LDLCALC, TRIG, CHOLHDL, LDLDIRECT in the last 72 hours. Thyroid Function Tests: No results for input(s): TSH, T4TOTAL, FREET4, T3FREE, THYROIDAB in the last 72 hours. Anemia Panel: No results for input(s): VITAMINB12, FOLATE, FERRITIN, TIBC, IRON, RETICCTPCT in the last 72 hours. Urine analysis:    Component Value Date/Time   COLORURINE Straw 04/09/2013 1138   APPEARANCEUR Clear 04/09/2013 1138   LABSPEC 1.009 04/09/2013 1138   PHURINE 6.0 04/09/2013 1138   GLUCOSEU 150 mg/dL 16/12/9602 5409   HGBUR Negative 04/09/2013 1138   BILIRUBINUR Negative 04/09/2013 1138   KETONESUR Negative 04/09/2013 1138   PROTEINUR Negative 04/09/2013 1138   NITRITE Negative 04/09/2013 1138   LEUKOCYTESUR 2+ 04/09/2013 1138    Creatinine Clearance: Estimated Creatinine Clearance: 60 mL/min (by  C-G formula based on SCr of 0.8 mg/dL).  Sepsis Labs: @LABRCNTIP (procalcitonin:4,lacticidven:4) )No results found for this or any previous visit (from the past 240 hour(s)).   Radiological Exams on Admission: Ct Angio Head W Or Wo Contrast  Result Date: 11/04/2015 CLINICAL DATA:  Left-sided numbness. EXAM: CT ANGIOGRAPHY HEAD AND NECK TECHNIQUE: Multidetector CT imaging of the head and neck was performed using the standard protocol during bolus administration of intravenous contrast. Multiplanar CT image reconstructions and MIPs were obtained to evaluate the vascular anatomy. Carotid stenosis measurements (when applicable) are obtained utilizing NASCET criteria, using the distal internal carotid diameter as the denominator. CONTRAST:  75 cc Isovue 370 intravenous COMPARISON:  Noncontrast head CT from earlier today FINDINGS: CTA NECK Aortic arch: Atheromatous calcifications. No aneurysm or dissection. 3 vessel branching. Right carotid system: Mixed calcified and noncalcified plaque at the bifurcation with 20% maximal proximal ICA stenosis. Left  carotid system: Atherosclerotic calcification primarily at the common carotid bifurcation which is mixed calcified and low-density. Stenosis measures up to 50% when measured coronally at the ICA origin. There is a low-density luminal filling defect along the posterior and lateral walls which appears to project into the lumen, as seen with thrombus. No evidence of dissection. Vertebral arteries:No proximal subclavian artery stenosis. Smooth and widely patent vertebral arteries with mild dominance on the left. Skeleton: No acute or aggressive process. Cervical disc and facet degeneration. Other neck: No incidental mass or adenopathy. Upper chest: Clear apical lungs CTA HEAD Anterior circulation: Carotid siphon atherosclerotic luminal irregularity and calcification. Mild to moderate right mid cavernous segment narrowing. No major branch occlusion. There is diffuse atherosclerotic irregularity of medium size vessels. Negative for aneurysm. Posterior circulation: Mild left vertebral artery dominance. Standard vertebrobasilar branching. There is a critical right P1 segment stenosis with occluded PCA near the P1 2 junction (no post communicating artery). Negative for aneurysm. Venous sinuses: Patent Anatomic variants: Anterior communicating artery. No visible posterior communicating arteries. Delayed phase: Remote appearing right occipital pole infarct. As noted previously there may be more recent marginal ischemia. No visible thalamic infarct. Likely chronic pontomidbrain junction lacune. These results were called by telephone at the time of interpretation on 11/04/2015 at 10:36 am to Dr. Lacretia Nicks , who verbally acknowledged these results. IMPRESSION: 1. Proximal right PCA occlusion. There is a chronic appearing right occipital pole infarct; the level of occlusion puts a much greater area at risk. 2. Cervical carotid atherosclerosis with proximal ICA stenosis of approximately 20% on the right and 50% on the left.  Low-density thrombus or irregular plaque projects into the proximal left ICA lumen. 3. Diffuse intracranial atherosclerosis. Electronically Signed   By: Marnee Spring M.D.   On: 11/04/2015 10:45   Ct Head Wo Contrast  Result Date: 11/04/2015 CLINICAL DATA:  Code stroke EXAM: CT HEAD WITHOUT CONTRAST TECHNIQUE: Contiguous axial images were obtained from the base of the skull through the vertex without intravenous contrast. COMPARISON:  04/09/2013 FINDINGS: No skull fracture is noted. Paranasal sinuses and mastoid air cells are unremarkable. No intracranial hemorrhage, mass effect or midline shift. Mild cerebral atrophy. Mild periventricular white matter decreased attenuation probable due to chronic small vessel ischemic changes. Bilateral basal ganglia punctate calcifications are again noted. There is interval old appearing infarct in right occipital lobe axial image 17 measures 1.6 cm. Axial image 15 there is small area of decreased attenuation in right occipital lobe. New ischemia cannot be excluded. Clinical correlation is necessary. Further correlation with MRI is recommended as clinically warranted. IMPRESSION: There  is interval old appearing infarct in right occipital lobe axial image 17 measures 1.6 cm. Axial image 15 there is small area of decreased attenuation in right occipital lobe. New ischemia cannot be excluded. Clinical correlation is necessary. Further correlation with MRI is recommended as clinically warranted. No intracranial hemorrhage, mass effect or midline shift. These results were called by telephone at the time of interpretation on 11/04/2015 at 9:01 am to Dr. Lacretia Nicks , who verbally acknowledged these results. Electronically Signed   By: Natasha Mead M.D.   On: 11/04/2015 09:02   Ct Angio Neck W And/or Wo Contrast  Result Date: 11/04/2015 CLINICAL DATA:  Left-sided numbness. EXAM: CT ANGIOGRAPHY HEAD AND NECK TECHNIQUE: Multidetector CT imaging of the head and neck was performed  using the standard protocol during bolus administration of intravenous contrast. Multiplanar CT image reconstructions and MIPs were obtained to evaluate the vascular anatomy. Carotid stenosis measurements (when applicable) are obtained utilizing NASCET criteria, using the distal internal carotid diameter as the denominator. CONTRAST:  75 cc Isovue 370 intravenous COMPARISON:  Noncontrast head CT from earlier today FINDINGS: CTA NECK Aortic arch: Atheromatous calcifications. No aneurysm or dissection. 3 vessel branching. Right carotid system: Mixed calcified and noncalcified plaque at the bifurcation with 20% maximal proximal ICA stenosis. Left carotid system: Atherosclerotic calcification primarily at the common carotid bifurcation which is mixed calcified and low-density. Stenosis measures up to 50% when measured coronally at the ICA origin. There is a low-density luminal filling defect along the posterior and lateral walls which appears to project into the lumen, as seen with thrombus. No evidence of dissection. Vertebral arteries:No proximal subclavian artery stenosis. Smooth and widely patent vertebral arteries with mild dominance on the left. Skeleton: No acute or aggressive process. Cervical disc and facet degeneration. Other neck: No incidental mass or adenopathy. Upper chest: Clear apical lungs CTA HEAD Anterior circulation: Carotid siphon atherosclerotic luminal irregularity and calcification. Mild to moderate right mid cavernous segment narrowing. No major branch occlusion. There is diffuse atherosclerotic irregularity of medium size vessels. Negative for aneurysm. Posterior circulation: Mild left vertebral artery dominance. Standard vertebrobasilar branching. There is a critical right P1 segment stenosis with occluded PCA near the P1 2 junction (no post communicating artery). Negative for aneurysm. Venous sinuses: Patent Anatomic variants: Anterior communicating artery. No visible posterior communicating  arteries. Delayed phase: Remote appearing right occipital pole infarct. As noted previously there may be more recent marginal ischemia. No visible thalamic infarct. Likely chronic pontomidbrain junction lacune. These results were called by telephone at the time of interpretation on 11/04/2015 at 10:36 am to Dr. Lacretia Nicks , who verbally acknowledged these results. IMPRESSION: 1. Proximal right PCA occlusion. There is a chronic appearing right occipital pole infarct; the level of occlusion puts a much greater area at risk. 2. Cervical carotid atherosclerosis with proximal ICA stenosis of approximately 20% on the right and 50% on the left. Low-density thrombus or irregular plaque projects into the proximal left ICA lumen. 3. Diffuse intracranial atherosclerosis. Electronically Signed   By: Marnee Spring M.D.   On: 11/04/2015 10:45    EKG: Independently reviewed. Sinus rhythm No significant change since last tracing                 Assessment/Plan Principal Problem:   Stroke-like symptoms Active Problems:   TIA (transient ischemic attack)   Diabetes (HCC)   Bradycardia   #1. Strokelike symptoms. Specifically left-sided weakness and ataxia. Concern for TIA versus stroke. CTA reveals proximal right PCA occlusion  with chronic appearing right occipital pole infarct, focal carotid atherosclerosis with proximal ICA stenosis of 20% on the right and 50% left low-density thrombus or irregular plaque projects into the proximal left ICA. Chart review indicates while at [facility] her exam was nonfocal but was hypertensive. Symptoms much improved on admission. Evaluated by neuro in the emergency department recommend admission for further workup. -Admit to telemetry -Obtain a hemoglobin A1c and a fasting lipid panel -Obtain MRI/MRA of the brain without contrast -PT consult OT consult -Obtain an echocardiogram -Frequent neuro checks -She has passed a swallow eval so will start a carb modified diet -Aspirin 325  per neuro recommendation  #2. Hypertension. Blood pressure 173/67 on admission noting that she received a dose of labetalol en route to [place]. Patient reports being taken off of her antihypertensive medications last year as hypertension "gone". Does not recall what medication she's been on in the past.  -Monitor -When necessary hydralazine with parameters allowing for permissive ht  #3. Diabetes. Serum glucose 199 on admission. Patient reports "never having to take meds". She is unaware what her A1c is. Some noncompliance -Obtain a hemoglobin A1c -Carb modified diet -We will use sliding scale insulin for optimal control -We will likely need outpatient follow-up  #4. Bradycardia. Heart rate 50-66. Of note she did receive some labetalol en route to [facility]. -monitor    DVT prophylaxis: scd  Code Status: full  Family Communication: none present  Disposition Plan: home  Consults called: neuro  Admission status: obs    Toya Smothers M MD [facility]  If 7PM-7AM, please contact night-coverage [email or web address] Password [email or web address]  11/04/2015, 1:43 PM

## 2015-11-04 NOTE — ED Notes (Signed)
Report given to Belia HemanKelly- Ramsey Charge RN

## 2015-11-04 NOTE — ED Notes (Signed)
SOC MD on screen

## 2015-11-04 NOTE — ED Triage Notes (Signed)
Arrived via [facility] from [facility] for evaluation for stroke symptoms and possible IR intervention. Patient alert answering and following commands appropriate.

## 2015-11-04 NOTE — ED Notes (Signed)
Patient alert and oriented x 4. NIH scaled 0. Passed Nursing swallow screen. Transfer from [facility] for possible IR intervention.

## 2015-11-04 NOTE — ED Provider Notes (Signed)
Time Seen: Approximately [phone]  I have reviewed the triage notes  Chief Complaint: Numbness   History of Present Illness: Margaret Mcintosh is a 82 y.o. female *who presents with complaints to EMS of some left-sided numbness in her face and left upper extremity. She states she had some difficulty walking this morning staggering and had some left leg weakness. Shesymptoms approximately 4:30 this morning when she got up to go to the bathroom. Patient was found by EMS this morning was still hypertensive and having left arm numbness. She states the strength in her left arm and her left leg feel better. She denies any trouble with speech or swallowing. She denies any visual field deficits. She was found to be hypertensive with systolic pressure over 240 per EMS initial blood pressure here is 248/106. She denies any headache or right-sided symptoms.   Past Medical History:  Diagnosis Date  . Hypertension     There are no active problems to display for this patient.   Past Surgical History:  Procedure Laterality Date  . ABDOMINAL HYSTERECTOMY    . APPENDECTOMY    . FRACTURE SURGERY      Past Surgical History:  Procedure Laterality Date  . ABDOMINAL HYSTERECTOMY    . APPENDECTOMY    . FRACTURE SURGERY        Allergies:  Review of patient's allergies indicates no known allergies.  Family History: No family history on file.  Social History: Social History  Substance Use Topics  . Smoking status: Never Smoker  . Smokeless tobacco: Never Used  . Alcohol use No     Review of Systems:   10 point review of systems was performed and was otherwise negative:  Constitutional: No fever Eyes: No visual disturbances ENT: No sore throat, ear pain Cardiac: No chest pain Respiratory: No shortness of breath, wheezing, or stridor Abdomen: No abdominal pain, no vomiting, No diarrhea Endocrine: No weight loss, No night sweats Extremities: No peripheral edema, cyanosis Skin: No  rashes, easy bruising Neurologic: No focal weakness, trouble with speech or swollowing Urologic: No dysuria, Hematuria, or urinary frequency   Physical Exam:  ED Triage Vitals [11/04/15 0835]  Enc Vitals Group     BP (!) 248/106     Pulse Rate 84     Resp 17     Temp 97.7 F (36.5 C)     Temp Source Oral     SpO2 100 %     Weight 185 lb 4.8 oz (84.1 kg)     Height 5\' 5"  (1.651 m)     Head Circumference      Peak Flow      Pain Score      Pain Loc      Pain Edu?      Excl. in GC?     General: Awake , Alert , and Oriented times 3; GCS 15 Head: Normal cephalic , atraumatic Eyes: Pupils equal , round, reactive to light Nose/Throat: No nasal drainage, patent upper airway without erythema or exudate.  Neck: Supple, Full range of motion, No anterior adenopathy or palpable thyroid masses Lungs: Clear to ascultation without wheezes , rhonchi, or rales Heart: Regular rate, regular rhythm without murmurs , gallops , or rubs Abdomen: Soft, non tender without rebound, guarding , or rigidity; bowel sounds positive and symmetric in all 4 quadrants. No organomegaly .        Extremities: 2 plus symmetric pulses. No edema, clubbing or cyanosis Neurologic: normal ambulation, Motor symmetric without  deficits, sensory intact Skin: warm, dry, no rashes   Labs:   All laboratory work was reviewed including any pertinent negatives or positives listed below:  Labs Reviewed  COMPREHENSIVE METABOLIC PANEL  CBC WITH DIFFERENTIAL/PLATELET    EKG:  ED ECG REPORT I, Jennye MoccasinBrian S Quigley, the attending physician, personally viewed and interpreted this ECG.  Date: 11/04/2015 EKG Time: 0 834 Rate: 83 Rhythm: normal sinus rhythm QRS Axis: normal Intervals: normal ST/T Wave abnormalities: normal Conduction Disturbances: none Narrative Interpretation: unremarkable    Radiology:   CLINICAL DATA:  Left-sided numbness.  EXAM: CT ANGIOGRAPHY HEAD AND NECK  TECHNIQUE: Multidetector CT imaging  of the head and neck was performed using the standard protocol during bolus administration of intravenous contrast. Multiplanar CT image reconstructions and MIPs were obtained to evaluate the vascular anatomy. Carotid stenosis measurements (when applicable) are obtained utilizing NASCET criteria, using the distal internal carotid diameter as the denominator.  CONTRAST:  75 cc Isovue 370 intravenous  COMPARISON:  Noncontrast head CT from earlier today  FINDINGS: CTA NECK  Aortic arch: Atheromatous calcifications. No aneurysm or dissection. 3 vessel branching.  Right carotid system: Mixed calcified and noncalcified plaque at the bifurcation with 20% maximal proximal ICA stenosis.  Left carotid system: Atherosclerotic calcification primarily at the common carotid bifurcation which is mixed calcified and low-density. Stenosis measures up to 50% when measured coronally at the ICA origin. There is a low-density luminal filling defect along the posterior and lateral walls which appears to project into the lumen, as seen with thrombus. No evidence of dissection.  Vertebral arteries:No proximal subclavian artery stenosis. Smooth and widely patent vertebral arteries with mild dominance on the left.  Skeleton: No acute or aggressive process. Cervical disc and facet degeneration.  Other neck: No incidental mass or adenopathy.  Upper chest: Clear apical lungs  CTA HEAD  Anterior circulation: Carotid siphon atherosclerotic luminal irregularity and calcification. Mild to moderate right mid cavernous segment narrowing. No major branch occlusion. There is diffuse atherosclerotic irregularity of medium size vessels. Negative for aneurysm.  Posterior circulation: Mild left vertebral artery dominance. Standard vertebrobasilar branching. There is a critical right P1 segment stenosis with occluded PCA near the P1 2 junction (no post communicating artery). Negative for  aneurysm.  Venous sinuses: Patent  Anatomic variants: Anterior communicating artery. No visible posterior communicating arteries.  Delayed phase: Remote appearing right occipital pole infarct. As noted previously there may be more recent marginal ischemia. No visible thalamic infarct. Likely chronic pontomidbrain junction lacune.  These results were called by telephone at the time of interpretation on 11/04/2015 at 10:36 am to Dr. Lacretia NicksBRIAN QUIGLEY , who verbally acknowledged these results.  IMPRESSION: 1. Proximal right PCA occlusion. There is a chronic appearing right occipital pole infarct; the level of occlusion puts a much greater area at risk. 2. Cervical carotid atherosclerosis with proximal ICA stenosis of approximately 20% on the right and 50% on the left. Low-density thrombus or irregular plaque projects into the proximal left ICA lumen. 3. Diffuse intracranial atherosclerosis.   Electronically Signed   By: Marnee SpringJonathon  Watts M.D.   On: 11/04/2015 10:45   EXAM: CT HEAD WITHOUT CONTRAST  TECHNIQUE: Contiguous axial images were obtained from the base of the skull through the vertex without intravenous contrast.  COMPARISON:  04/09/2013  FINDINGS: No skull fracture is noted. Paranasal sinuses and mastoid air cells are unremarkable. No intracranial hemorrhage, mass effect or midline shift. Mild cerebral atrophy. Mild periventricular white matter decreased attenuation probable due to chronic small  vessel ischemic changes. Bilateral basal ganglia punctate calcifications are again noted. There is interval old appearing infarct in right occipital lobe axial image 17 measures 1.6 cm. Axial image 15 there is small area of decreased attenuation in right occipital lobe. New ischemia cannot be excluded. Clinical correlation is necessary. Further correlation with MRI is recommended as clinically warranted.  IMPRESSION: There is interval old appearing infarct in  right occipital lobe axial image 17 measures 1.6 cm. Axial image 15 there is small area of decreased attenuation in right occipital lobe. New ischemia cannot be excluded. Clinical correlation is necessary. Further correlation with MRI is recommended as clinically warranted. No intracranial hemorrhage, mass effect or midline shift.  These results were called by telephone at the time of interpretation on 11/04/2015 at 9:01 am to Dr. Lacretia Nicks , who verbally acknowledged these results.   Electronically Signed   By: Natasha Mead M.D.   On: 11/04/2015 09:02   Vitals      I personally reviewed the radiologic studies   Critical Care:  CRITICAL CARE Performed by: Jennye Moccasin   Total critical care time: 73 minutes  Critical care time was exclusive of separately billable procedures and treating other patients.  Critical care was necessary to treat or prevent imminent or life-threatening deterioration.  Critical care was time spent personally by me on the following activities: development of treatment plan with patient and/or surrogate as well as nursing, discussions with consultants, evaluation of patient's response to treatment, examination of patient, obtaining history from patient or surrogate, ordering and performing treatments and interventions, ordering and review of laboratory studies, ordering and review of radiographic studies, pulse oximetry and re-evaluation of patient's condition. Acute management of acute cerebrovascular accident with hypertensive emergency with multiple doses of hypertensive IV medication along with numerous phone calls for scheduled transferred to [facility]   ED Course:  Patient's stay here showed symptomatic relief as far as her neurologic symptoms. Patient was started on code stroke protocol with initial head CT showing no evidence of a hemorrhagic stroke. I spoke to the telemetry neurologist who recommended a CT he injured area of the head  and neck region. Patient received aspirin and was not initiated on IV heparin at this time. She is not a TPA candidate. The CT angiogram of the head showed what appeared to be a stricture or obstruction at the posterior communicating artery Patient's case was reviewed with the interventional neurologist at [facility] who wished to proceed with further studies including a perfusion study. Patient will be transported by care link to [facility] emergency department. Also spoke to the transfer center along with the neurology hospitalist and the staffing ED physician in the process of transference of care. Patient again still remains asymptomatic at this time. Her blood pressure did respond to labetalol and prior to departure her blood pressure started to spike up again over 200 and was given additional IV 80 mg of labetalol just prior to transport.     Clinical Course     Assessment:  Hypertensive emergency Acute cerebrovascular accident*      Plan:  Transferred to [facility]           Jennye Moccasin, MD 11/04/15 1243

## 2015-11-04 NOTE — ED Notes (Signed)
Patient transported to CT 

## 2015-11-04 NOTE — ED Notes (Signed)
[facility] at bedside 

## 2015-11-04 NOTE — ED Notes (Signed)
Per Dr. Huel CoteQuigley hold IV labetalol at this time

## 2015-11-04 NOTE — ED Notes (Signed)
Pt returned from CT, resting in bed, assisted to bathroom

## 2015-11-04 NOTE — ED Notes (Signed)
[facility]

## 2015-11-04 NOTE — Consult Note (Signed)
Requesting Physician: Dr. Erma Heritage    Chief Complaint: Left arm weakness which has resolved.   History obtained from:  Patient     HPI:                                                                                                                                         Margaret Mcintosh is an 82 y.o. female who was normal last night at 2300 hours. She went to bed and awoke this AM at 0430 and noted she was off balance and listing to the left. She staggered back to bed and awoke at 0700 hours. Symptoms of off balance did not resolve and she felt her left arm was very weak. She called EMS and was [facility] There a MD called [facility]. There was confusion as IR stated the chronic PCA was nothing they would intervene on and if they felt it was a wake up stroke she would need to be seen by a neurohospitalist to evaluate for wake up stroke. For some unknown reason Neurohospitalist at [place] ( Dr. Loretha Brasil) was not consulted and she was transferred to [facility]. ON arrival she had no symptoms and NIHSS of 0.   Date last known well: Last night Time last known well: Time: 24:00 tPA Given: No: out of window  Past Medical History:  Diagnosis Date  . Arthritis   . Cataract   . Diabetes mellitus (HCC)   . Glaucoma   . Hypertension     Past Surgical History:  Procedure Laterality Date  . ABDOMINAL HYSTERECTOMY    . APPENDECTOMY    . FRACTURE SURGERY      Family History  Problem Relation Age of Onset  . Hypertension Mother   . Hypertension Father    Social History:  reports that she has never smoked. She has never used smokeless tobacco. She reports that she does not drink alcohol or use drugs.  Allergies:  Allergies  Allergen Reactions  . Codeine Other (See Comments)    Gets nausea and feels like"something is crawling"  . Diphenhydramine Hcl Other (See Comments)    Feels like skin crawling    Medications:  No current facility-administered medications for this encounter.    No current outpatient prescriptions on file.     ROS:                                                                                                                                       History obtained from the patient  General ROS: negative for - chills, fatigue, fever, night sweats, weight gain or weight loss Psychological ROS: negative for - behavioral disorder, hallucinations, memory difficulties, mood swings or suicidal ideation Ophthalmic ROS: negative for - blurry vision, double vision, eye pain or loss of vision ENT ROS: negative for - epistaxis, nasal discharge, oral lesions, sore throat, tinnitus or vertigo Allergy and Immunology ROS: negative for - hives or itchy/watery eyes Hematological and Lymphatic ROS: negative for - bleeding problems, bruising or swollen lymph nodes Endocrine ROS: negative for - galactorrhea, hair pattern changes, polydipsia/polyuria or temperature intolerance Respiratory ROS: negative for - cough, hemoptysis, shortness of breath or wheezing Cardiovascular ROS: negative for - chest pain, dyspnea on exertion, edema or irregular heartbeat Gastrointestinal ROS: negative for - abdominal pain, diarrhea, hematemesis, nausea/vomiting or stool incontinence Genito-Urinary ROS: negative for - dysuria, hematuria, incontinence or urinary frequency/urgency Musculoskeletal ROS: negative for - joint swelling or muscular weakness Neurological ROS: as noted in HPI Dermatological ROS: negative for rash and skin lesion changes  Neurologic Examination:                                                                                                      Blood pressure 189/77, pulse 66, resp. rate 18, SpO2 96 %.  HEENT-  Normocephalic, no lesions, without obvious abnormality.  Normal external eye and conjunctiva.  Normal TM's bilaterally.   Normal auditory canals and external ears. Normal external nose, mucus membranes and septum.  Normal pharynx. Cardiovascular- S1, S2 normal, pulses palpable throughout   Lungs- chest clear, no wheezing, rales, normal symmetric air entry Abdomen- normal findings: bowel sounds normal Extremities- no edema Lymph-no adenopathy palpable Musculoskeletal-no joint tenderness, deformity or swelling Skin-warm and dry, no hyperpigmentation, vitiligo, or suspicious lesions  Neurological Examination Mental Status: Alert, oriented, thought content appropriate.  Speech fluent without evidence of aphasia.  Able to follow 3 step commands without difficulty. Cranial Nerves: II:  Visual fields grossly normal, pupils equal, round, reactive to light and accommodation III,IV, VI: ptosis not present, extra-ocular motions intact bilaterally V,VII: smile symmetric, facial light touch sensation normal bilaterally VIII: hearing normal bilaterally IX,X: uvula rises symmetrically  XI: bilateral shoulder shrug XII: midline tongue extension Motor: Right : Upper extremity   5/5    Left:     Upper extremity   5/5  Lower extremity   5/5     Lower extremity   5/5 Tone and bulk:normal tone throughout; no atrophy noted Sensory: Pinprick and light touch intact throughout, bilaterally Deep Tendon Reflexes: 2+ and symmetric throughout Plantars: Right: downgoing   Left: downgoing Cerebellar: normal finger-to-nose and normal heel-to-shin test Gait: not tested       Lab Results: Basic Metabolic Panel:  Recent Labs Lab 11/04/15 0912  NA 139  K 5.0  CL 103  CO2 30  GLUCOSE 199*  BUN 13  CREATININE 0.85  CALCIUM 9.8    Liver Function Tests:  Recent Labs Lab 11/04/15 0912  AST 20  ALT 15  ALKPHOS 72  BILITOT 0.9  PROT 7.9  ALBUMIN 4.3   No results for input(s): LIPASE, AMYLASE in the last 168 hours. No results for input(s): AMMONIA in the last 168 hours.  CBC:  Recent Labs Lab 11/04/15 0839   WBC 6.2  NEUTROABS 4.0  HGB 15.4  HCT 44.9  MCV 87.9  PLT 165    Cardiac Enzymes: No results for input(s): CKTOTAL, CKMB, CKMBINDEX, TROPONINI in the last 168 hours.  Lipid Panel: No results for input(s): CHOL, TRIG, HDL, CHOLHDL, VLDL, LDLCALC in the last 168 hours.  CBG:  Recent Labs Lab 11/04/15 0839  GLUCAP 148*    Microbiology: No results found for this or any previous visit.  Coagulation Studies: No results for input(s): LABPROT, INR in the last 72 hours.  Imaging: Ct Angio Head W Or Wo Contrast  Result Date: 11/04/2015 CLINICAL DATA:  Left-sided numbness. EXAM: CT ANGIOGRAPHY HEAD AND NECK TECHNIQUE: Multidetector CT imaging of the head and neck was performed using the standard protocol during bolus administration of intravenous contrast. Multiplanar CT image reconstructions and MIPs were obtained to evaluate the vascular anatomy. Carotid stenosis measurements (when applicable) are obtained utilizing NASCET criteria, using the distal internal carotid diameter as the denominator. CONTRAST:  75 cc Isovue 370 intravenous COMPARISON:  Noncontrast head CT from earlier today FINDINGS: CTA NECK Aortic arch: Atheromatous calcifications. No aneurysm or dissection. 3 vessel branching. Right carotid system: Mixed calcified and noncalcified plaque at the bifurcation with 20% maximal proximal ICA stenosis. Left carotid system: Atherosclerotic calcification primarily at the common carotid bifurcation which is mixed calcified and low-density. Stenosis measures up to 50% when measured coronally at the ICA origin. There is a low-density luminal filling defect along the posterior and lateral walls which appears to project into the lumen, as seen with thrombus. No evidence of dissection. Vertebral arteries:No proximal subclavian artery stenosis. Smooth and widely patent vertebral arteries with mild dominance on the left. Skeleton: No acute or aggressive process. Cervical disc and facet  degeneration. Other neck: No incidental mass or adenopathy. Upper chest: Clear apical lungs CTA HEAD Anterior circulation: Carotid siphon atherosclerotic luminal irregularity and calcification. Mild to moderate right mid cavernous segment narrowing. No major branch occlusion. There is diffuse atherosclerotic irregularity of medium size vessels. Negative for aneurysm. Posterior circulation: Mild left vertebral artery dominance. Standard vertebrobasilar branching. There is a critical right P1 segment stenosis with occluded PCA near the P1 2 junction (no post communicating artery). Negative for aneurysm. Venous sinuses: Patent Anatomic variants: Anterior communicating artery. No visible posterior communicating arteries. Delayed phase: Remote appearing right occipital pole infarct. As noted previously there may be more recent marginal ischemia.  No visible thalamic infarct. Likely chronic pontomidbrain junction lacune. These results were called by telephone at the time of interpretation on 11/04/2015 at 10:36 am to Dr. Lacretia Nicks , who verbally acknowledged these results. IMPRESSION: 1. Proximal right PCA occlusion. There is a chronic appearing right occipital pole infarct; the level of occlusion puts a much greater area at risk. 2. Cervical carotid atherosclerosis with proximal ICA stenosis of approximately 20% on the right and 50% on the left. Low-density thrombus or irregular plaque projects into the proximal left ICA lumen. 3. Diffuse intracranial atherosclerosis. Electronically Signed   By: Marnee Spring M.D.   On: 11/04/2015 10:45   Ct Head Wo Contrast  Result Date: 11/04/2015 CLINICAL DATA:  Code stroke EXAM: CT HEAD WITHOUT CONTRAST TECHNIQUE: Contiguous axial images were obtained from the base of the skull through the vertex without intravenous contrast. COMPARISON:  04/09/2013 FINDINGS: No skull fracture is noted. Paranasal sinuses and mastoid air cells are unremarkable. No intracranial hemorrhage, mass  effect or midline shift. Mild cerebral atrophy. Mild periventricular white matter decreased attenuation probable due to chronic small vessel ischemic changes. Bilateral basal ganglia punctate calcifications are again noted. There is interval old appearing infarct in right occipital lobe axial image 17 measures 1.6 cm. Axial image 15 there is small area of decreased attenuation in right occipital lobe. New ischemia cannot be excluded. Clinical correlation is necessary. Further correlation with MRI is recommended as clinically warranted. IMPRESSION: There is interval old appearing infarct in right occipital lobe axial image 17 measures 1.6 cm. Axial image 15 there is small area of decreased attenuation in right occipital lobe. New ischemia cannot be excluded. Clinical correlation is necessary. Further correlation with MRI is recommended as clinically warranted. No intracranial hemorrhage, mass effect or midline shift. These results were called by telephone at the time of interpretation on 11/04/2015 at 9:01 am to Dr. Lacretia Nicks , who verbally acknowledged these results. Electronically Signed   By: Natasha Mead M.D.   On: 11/04/2015 09:02   Ct Angio Neck W And/or Wo Contrast  Result Date: 11/04/2015 CLINICAL DATA:  Left-sided numbness. EXAM: CT ANGIOGRAPHY HEAD AND NECK TECHNIQUE: Multidetector CT imaging of the head and neck was performed using the standard protocol during bolus administration of intravenous contrast. Multiplanar CT image reconstructions and MIPs were obtained to evaluate the vascular anatomy. Carotid stenosis measurements (when applicable) are obtained utilizing NASCET criteria, using the distal internal carotid diameter as the denominator. CONTRAST:  75 cc Isovue 370 intravenous COMPARISON:  Noncontrast head CT from earlier today FINDINGS: CTA NECK Aortic arch: Atheromatous calcifications. No aneurysm or dissection. 3 vessel branching. Right carotid system: Mixed calcified and noncalcified  plaque at the bifurcation with 20% maximal proximal ICA stenosis. Left carotid system: Atherosclerotic calcification primarily at the common carotid bifurcation which is mixed calcified and low-density. Stenosis measures up to 50% when measured coronally at the ICA origin. There is a low-density luminal filling defect along the posterior and lateral walls which appears to project into the lumen, as seen with thrombus. No evidence of dissection. Vertebral arteries:No proximal subclavian artery stenosis. Smooth and widely patent vertebral arteries with mild dominance on the left. Skeleton: No acute or aggressive process. Cervical disc and facet degeneration. Other neck: No incidental mass or adenopathy. Upper chest: Clear apical lungs CTA HEAD Anterior circulation: Carotid siphon atherosclerotic luminal irregularity and calcification. Mild to moderate right mid cavernous segment narrowing. No major branch occlusion. There is diffuse atherosclerotic irregularity of medium size vessels. Negative for  aneurysm. Posterior circulation: Mild left vertebral artery dominance. Standard vertebrobasilar branching. There is a critical right P1 segment stenosis with occluded PCA near the P1 2 junction (no post communicating artery). Negative for aneurysm. Venous sinuses: Patent Anatomic variants: Anterior communicating artery. No visible posterior communicating arteries. Delayed phase: Remote appearing right occipital pole infarct. As noted previously there may be more recent marginal ischemia. No visible thalamic infarct. Likely chronic pontomidbrain junction lacune. These results were called by telephone at the time of interpretation on 11/04/2015 at 10:36 am to Dr. Lacretia Nicks , who verbally acknowledged these results. IMPRESSION: 1. Proximal right PCA occlusion. There is a chronic appearing right occipital pole infarct; the level of occlusion puts a much greater area at risk. 2. Cervical carotid atherosclerosis with proximal  ICA stenosis of approximately 20% on the right and 50% on the left. Low-density thrombus or irregular plaque projects into the proximal left ICA lumen. 3. Diffuse intracranial atherosclerosis. Electronically Signed   By: Marnee Spring M.D.   On: 11/04/2015 10:45       Assessment and plan discussed with with attending physician and they are in agreement.    Felicie Morn PA-C [facility] [phone]  11/04/2015, 1:01 PM   Assessment: 82 y.o. female who was last seen normal at 2300 hours last night. This AM had continued left sided symptoms and seen in ED at [facility]. While at [facility] her exam was non-focal but due to CTA reading of chronic PCA occlusion she was transferred to [facility]. Currently she remains asymptomatic. Given history cannot rule out possible TIA versus CVA with resolved symptoms.   Stroke Risk Factors - diabetes mellitus and hypertension   Recommend: 1. HgbA1c, fasting lipid panel 2. MRI, MRA  of the brain without contrast 3. PT consult, OT consult, Speech consult 4. Echocardiogram 6. Prophylactic therapy-Antiplatelet med: Aspirin - dose 325 mg daily 7. Risk factor modification 8. Telemetry monitoring 9. Frequent neuro checks 10 NPO until passes stroke swallow screen 11 please page stroke NP  Or  PA  Or MD from 8am -4 pm  as this patient from this time will be  followed by the stroke.   You can look them up on [email or web address]  Password [facility]     Neurology Attending:  Agree with above.  The patient has been evaluated by Felicie Morn, PA, and myself.  She is presently asymptomatic.  She has evidence of an old right occipital pole infarct.  CTA revealed evidence of occlusion of the proximal right PCA.  The patient was not within the time window for tPA given the onset of the symptoms was unknown as the patient awoke symptomatic having a last normal at 11pm.  Plan:  1. Further evaluations as outlined above.   Bashir Marchetti A. Hilda Blades, M.D. Neurohospitalist

## 2015-11-05 ENCOUNTER — Observation Stay (HOSPITAL_COMMUNITY): Payer: Medicare Other

## 2015-11-05 ENCOUNTER — Observation Stay (HOSPITAL_BASED_OUTPATIENT_CLINIC_OR_DEPARTMENT_OTHER): Payer: Medicare Other

## 2015-11-05 DIAGNOSIS — I6330 Cerebral infarction due to thrombosis of unspecified cerebral artery: Secondary | ICD-10-CM | POA: Diagnosis not present

## 2015-11-05 DIAGNOSIS — R001 Bradycardia, unspecified: Secondary | ICD-10-CM

## 2015-11-05 DIAGNOSIS — R2990 Unspecified symptoms and signs involving the nervous system: Secondary | ICD-10-CM | POA: Diagnosis not present

## 2015-11-05 DIAGNOSIS — E119 Type 2 diabetes mellitus without complications: Secondary | ICD-10-CM | POA: Diagnosis present

## 2015-11-05 DIAGNOSIS — Z885 Allergy status to narcotic agent status: Secondary | ICD-10-CM | POA: Diagnosis not present

## 2015-11-05 DIAGNOSIS — Z8249 Family history of ischemic heart disease and other diseases of the circulatory system: Secondary | ICD-10-CM | POA: Diagnosis not present

## 2015-11-05 DIAGNOSIS — I10 Essential (primary) hypertension: Secondary | ICD-10-CM | POA: Diagnosis present

## 2015-11-05 DIAGNOSIS — Z8673 Personal history of transient ischemic attack (TIA), and cerebral infarction without residual deficits: Secondary | ICD-10-CM | POA: Diagnosis not present

## 2015-11-05 DIAGNOSIS — R270 Ataxia, unspecified: Secondary | ICD-10-CM | POA: Diagnosis present

## 2015-11-05 DIAGNOSIS — G459 Transient cerebral ischemic attack, unspecified: Secondary | ICD-10-CM

## 2015-11-05 DIAGNOSIS — G8194 Hemiplegia, unspecified affecting left nondominant side: Secondary | ICD-10-CM | POA: Diagnosis present

## 2015-11-05 DIAGNOSIS — R1032 Left lower quadrant pain: Secondary | ICD-10-CM | POA: Diagnosis not present

## 2015-11-05 DIAGNOSIS — E785 Hyperlipidemia, unspecified: Secondary | ICD-10-CM | POA: Diagnosis present

## 2015-11-05 DIAGNOSIS — Z9119 Patient's noncompliance with other medical treatment and regimen: Secondary | ICD-10-CM | POA: Diagnosis not present

## 2015-11-05 DIAGNOSIS — H409 Unspecified glaucoma: Secondary | ICD-10-CM | POA: Diagnosis present

## 2015-11-05 DIAGNOSIS — R471 Dysarthria and anarthria: Secondary | ICD-10-CM | POA: Diagnosis present

## 2015-11-05 DIAGNOSIS — Z833 Family history of diabetes mellitus: Secondary | ICD-10-CM | POA: Diagnosis not present

## 2015-11-05 DIAGNOSIS — I160 Hypertensive urgency: Secondary | ICD-10-CM | POA: Diagnosis present

## 2015-11-05 DIAGNOSIS — I639 Cerebral infarction, unspecified: Secondary | ICD-10-CM | POA: Diagnosis present

## 2015-11-05 DIAGNOSIS — Z888 Allergy status to other drugs, medicaments and biological substances status: Secondary | ICD-10-CM | POA: Diagnosis not present

## 2015-11-05 LAB — BASIC METABOLIC PANEL
ANION GAP: 8 (ref 5–15)
BUN: 8 mg/dL (ref 6–20)
CHLORIDE: 103 mmol/L (ref 101–111)
CO2: 26 mmol/L (ref 22–32)
Calcium: 9.3 mg/dL (ref 8.9–10.3)
Creatinine, Ser: 0.83 mg/dL (ref 0.44–1.00)
GFR calc Af Amer: >60 mL/min (ref >60)
GFR calc non Af Amer: >60 mL/min (ref >60)
GLUCOSE: 153 mg/dL — AB (ref 65–99)
POTASSIUM: 4 mmol/L (ref 3.5–5.1)
SODIUM: 137 mmol/L (ref 135–145)

## 2015-11-05 LAB — LIPID PANEL
CHOLESTEROL: 169 mg/dL (ref 0–200)
HDL: 46 mg/dL (ref >40)
LDL CALC: 100 mg/dL — AB (ref 0–99)
TRIGLYCERIDES: 117 mg/dL (ref <150)
Total CHOL/HDL Ratio: 3.7 RATIO
VLDL: 23 mg/dL (ref 0–40)

## 2015-11-05 LAB — CBC WITH DIFFERENTIAL/PLATELET
BASOS ABS: 0 10*3/uL (ref 0.0–0.1)
Basophils Relative: 0 %
Eosinophils Absolute: 0.1 10*3/uL (ref 0.0–0.7)
Eosinophils Relative: 2 %
HEMATOCRIT: 44.1 % (ref 36.0–46.0)
Hemoglobin: 14 g/dL (ref 12.0–15.0)
LYMPHS ABS: 1.2 10*3/uL (ref 0.7–4.0)
LYMPHS PCT: 22 %
MCH: 29.3 pg (ref 26.0–34.0)
MCHC: 31.7 g/dL (ref 30.0–36.0)
MCV: 92.3 fL (ref 78.0–100.0)
MONO ABS: 0.6 10*3/uL (ref 0.1–1.0)
MONOS PCT: 11 %
NEUTROS ABS: 3.7 10*3/uL (ref 1.7–7.7)
Neutrophils Relative %: 65 %
Platelets: 183 10*3/uL (ref 150–400)
RBC: 4.78 MIL/uL (ref 3.87–5.11)
RDW: 13.4 % (ref 11.5–15.5)
WBC: 5.6 10*3/uL (ref 4.0–10.5)

## 2015-11-05 LAB — GLUCOSE, CAPILLARY
Glucose-Capillary: 158 mg/dL — ABNORMAL HIGH (ref 65–99)
Glucose-Capillary: 143 mg/dL — ABNORMAL HIGH (ref 65–99)
Glucose-Capillary: 171 mg/dL — ABNORMAL HIGH (ref 65–99)
Glucose-Capillary: 175 mg/dL — ABNORMAL HIGH (ref 65–99)

## 2015-11-05 LAB — ECHOCARDIOGRAM COMPLETE
HEIGHTINCHES: 65 in
Weight: 2964.8 oz

## 2015-11-05 LAB — MAGNESIUM: Magnesium: 2.1 mg/dL (ref 1.7–2.4)

## 2015-11-05 MED ORDER — SODIUM CHLORIDE 0.9 % IV SOLN
INTRAVENOUS | Status: DC
  Administered 2015-11-05 – 2015-11-06 (×3): via INTRAVENOUS

## 2015-11-05 MED ORDER — LABETALOL HCL 100 MG PO TABS
100.0000 mg | ORAL_TABLET | Freq: Three times a day (TID) | ORAL | Status: DC
  Administered 2015-11-05 – 2015-11-08 (×10): 100 mg via ORAL
  Filled 2015-11-05 (×10): qty 1

## 2015-11-05 MED ORDER — ACETAMINOPHEN 325 MG PO TABS
325.0000 mg | ORAL_TABLET | Freq: Four times a day (QID) | ORAL | Status: DC | PRN
  Administered 2015-11-05: 650 mg via ORAL
  Filled 2015-11-05: qty 2

## 2015-11-05 MED ORDER — SODIUM CHLORIDE 0.9 % IV BOLUS (SEPSIS)
500.0000 mL | Freq: Once | INTRAVENOUS | Status: AC
  Administered 2015-11-05: 500 mL via INTRAVENOUS

## 2015-11-05 MED ORDER — ASPIRIN EC 325 MG PO TBEC
325.0000 mg | DELAYED_RELEASE_TABLET | Freq: Every day | ORAL | Status: DC
Start: 2015-11-05 — End: 2015-11-08
  Administered 2015-11-05 – 2015-11-08 (×4): 325 mg via ORAL
  Filled 2015-11-05 (×4): qty 1

## 2015-11-05 MED ORDER — ASPIRIN 300 MG RE SUPP: 300.0000 mg | Freq: Every day | RECTAL | Status: DC

## 2015-11-05 MED ORDER — PERFLUTREN LIPID MICROSPHERE
INTRAVENOUS | Status: AC
  Filled 2015-11-05: qty 10

## 2015-11-05 MED ORDER — PERFLUTREN LIPID MICROSPHERE
1.0000 mL | INTRAVENOUS | Status: AC | PRN
  Administered 2015-11-05: 2 mL via INTRAVENOUS
  Filled 2015-11-05: qty 10

## 2015-11-05 MED ORDER — HYDRALAZINE HCL 20 MG/ML IJ SOLN: 5.0000 mg | Freq: Four times a day (QID) | INTRAMUSCULAR | Status: DC | PRN

## 2015-11-05 MED ORDER — NYSTATIN 100000 UNIT/GM EX POWD
Freq: Two times a day (BID) | CUTANEOUS | Status: DC
  Administered 2015-11-06 – 2015-11-07 (×2): via TOPICAL
  Filled 2015-11-05: qty 15

## 2015-11-05 MED ORDER — PANTOPRAZOLE SODIUM 40 MG PO TBEC
40.0000 mg | DELAYED_RELEASE_TABLET | Freq: Every day | ORAL | Status: DC
  Administered 2015-11-06 – 2015-11-08 (×3): 40 mg via ORAL
  Filled 2015-11-05 (×3): qty 1

## 2015-11-05 MED ORDER — SIMVASTATIN 20 MG PO TABS
20.0000 mg | ORAL_TABLET | Freq: Every day | ORAL | Status: DC
  Administered 2015-11-05 – 2015-11-08 (×4): 20 mg via ORAL
  Filled 2015-11-05 (×4): qty 1

## 2015-11-05 MED ORDER — IBUPROFEN 200 MG PO TABS
400.0000 mg | ORAL_TABLET | Freq: Three times a day (TID) | ORAL | Status: AC
  Administered 2015-11-05 – 2015-11-08 (×9): 400 mg via ORAL
  Filled 2015-11-05 (×9): qty 2

## 2015-11-05 MED ORDER — AMLODIPINE BESYLATE 5 MG PO TABS
5.0000 mg | ORAL_TABLET | Freq: Every day | ORAL | Status: DC
  Administered 2015-11-06 – 2015-11-07 (×2): 5 mg via ORAL
  Filled 2015-11-05 (×2): qty 1

## 2015-11-05 MED ORDER — LABETALOL HCL 5 MG/ML IV SOLN
10.0000 mg | INTRAVENOUS | Status: DC | PRN
  Administered 2015-11-05 (×3): 20 mg via INTRAVENOUS
  Filled 2015-11-05 (×3): qty 4

## 2015-11-05 NOTE — Progress Notes (Signed)
Pt to echo.

## 2015-11-05 NOTE — Progress Notes (Addendum)
Pt reports that since today 8/11 since 0600 pt started having left sided face numbness, ("like when you get a shot at the dentist"). At present pt has left sided facial droop, slurred speech, slight decreased left eye peripheral vision. Left hand drift and decreased sensation, left leg full sensation, slightly weaker than right leg but no drift.    Son says he notes that facial droop and speech definitely started this morning after 0600.   NIH went from a 1 to a 7   Will alert md of changes.

## 2015-11-05 NOTE — Care Management Note (Signed)
Case Management Note  Patient Details  Name: Margaret Mcintosh MRN: [id number] Date of Birth: 01-31-1933  Subjective/Objective:   Pt admitted with CVA. She is from home alone.                  Action/Plan: Awaiting PT recommendations. OT rec is for SNF. CM following for discharge disposition.   Expected Discharge Date:                  Expected Discharge Plan:  Skilled Nursing Facility  In-House Referral:     Discharge planning Services     Post Acute Care Choice:    Choice offered to:     DME Arranged:    DME Agency:     HH Arranged:    HH Agency:     Status of Service:  In process, will continue to follow  If discussed at Long Length of Stay Meetings, dates discussed:    Additional Comments:  Kermit BaloKelli F Rashana Andrew, RN 11/05/2015, 2:31 PM

## 2015-11-05 NOTE — Progress Notes (Signed)
  Echocardiogram 2D Echocardiogram with Definity has been performed.  [name], Tony 11/05/2015, 12:38 PM

## 2015-11-05 NOTE — Evaluation (Signed)
Occupational Therapy Evaluation Patient Details Name: Margaret LawlessShelvia S Mcintosh MRN: [id number] DOB: 05/12/1932 Today's Date: 11/05/2015    History of Present Illness Pt was admitted with stroke-like symptoms, L sided weakness.  H/O HTN and DM   Clinical Impression   This 82 year old female was admitted for the above.  RN reports increased weakness since admission.  Pt was independent prior to admission and will benefit from continued OT.  Pt needs up to total A, +2 when standing for adls. Goals are for min A +2 for safety    Follow Up Recommendations  SNF (may benefit from CIR--observation at time of eval)    Equipment Recommendations   (to be further assessed, 3:1 vs drop arm)    Recommendations for Other Services       Precautions / Restrictions Precautions Precautions: Fall Precaution Comments: while OT was working with pt, RN came in and reported symptoms worse than admission Restrictions Weight Bearing Restrictions: No      Mobility Bed Mobility Overal bed mobility: Needs Assistance Bed Mobility: Supine to Sit     Supine to sit: Mod assist Sit to supine: Mod assist   General bed mobility comments: assist for LLE and trunk  Transfers Overall transfer level: Needs assistance Equipment used: 1 person hand held assist Transfers: Sit to/from Stand Sit to Stand: Mod assist;From elevated surface         General transfer comment: assist to power up and stabilize; L knee blocked. Pt unsteady/swaying    Balance Overall balance assessment: Needs assistance Sitting-balance support: Single extremity supported;Feet supported Sitting balance-Leahy Scale: Fair Sitting balance - Comments: min guard for safety   Standing balance support: Single extremity supported Standing balance-Leahy Scale: Poor                              ADL Overall ADL's : Needs assistance/impaired     Grooming: Minimal assistance;Sitting   Upper Body Bathing: Minimal  assitance;Sitting   Lower Body Bathing: Maximal assistance;+2 for physical assistance;Sit to/from stand   Upper Body Dressing : Moderate assistance;Bed level   Lower Body Dressing: Total assistance;+2 for physical assistance;Sit to/from stand                 General ADL Comments: pt able to maintain static sitting balance; not really able to weight bear through LUE.  Stood with mod A but pt very unsteady and L knee was buckling.  Need +2 for ADLs due to this.  Will try lateral or squat pivot transfer     Vision     Perception     Praxis      Pertinent Vitals/Pain Pain Assessment: No/denies pain     Hand Dominance Right   Extremity/Trunk Assessment Upper Extremity Assessment Upper Extremity Assessment: LUE deficits/detail LUE Deficits / Details: pt has movement throughout LUE, but very weak, grossly 2-/5 throughout           Communication Communication Communication: No difficulties   Cognition Arousal/Alertness: Awake/alert Behavior During Therapy: WFL for tasks assessed/performed Overall Cognitive Status: Within Functional Limits for tasks assessed                     General Comments       Exercises       Shoulder Instructions      Home Living Family/patient expects to be discharged to:: Private residence Living Arrangements: Alone  Additional Comments: per chart, 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      Prior Functioning/Environment Level of Independence: Independent             OT Diagnosis: Hemiplegia non-dominant side   OT Problem List: Decreased strength;Decreased activity tolerance;Pain;Decreased knowledge of use of DME or AE;Impaired balance (sitting and/or standing);Impaired UE functional use;Impaired sensation   OT Treatment/Interventions: Self-care/ADL training;DME and/or AE instruction;Balance training;Patient/family education;Therapeutic activities    OT Goals(Current goals can be found in the care  plan section) Acute Rehab OT Goals Patient Stated Goal: get better OT Goal Formulation: With patient Time For Goal Achievement: 11/19/15 Potential to Achieve Goals: Good ADL Goals Pt Will Perform Grooming: with set-up;sitting Pt Will Transfer to Toilet: with min assist;with +2 assist;squat pivot transfer;bedside commode (vs lateral with drop arm vs standard 3:1) Additional ADL Goal #1: pt will use LUE as stabilizer/gross assist with min cues Additional ADL Goal #2: pt will be independent with AAROM HEP Additional ADL Goal #3: pt will go from sit to stand with min A +2 safety and maintain with min A for adls  OT Frequency: Min 3X/week   Barriers to D/C:            Co-evaluation              End of Session    Activity Tolerance: Patient limited by fatigue Patient left: in bed;with call bell/phone within reach;with nursing/sitter in room;with bed alarm set   Time: 4540-9811 OT Time Calculation (min): 22 min Charges:  OT General Charges $OT Visit: 1 Procedure OT Evaluation $OT Eval Moderate Complexity: 1 Procedure G-Codes: OT G-codes **NOT FOR INPATIENT CLASS** Functional Assessment Tool Used: clinical judgment Functional Limitation: Self care Self Care Current Status (B1478): At least 80 percent but less than 100 percent impaired, limited or restricted Self Care Goal Status (G9562): At least 40 percent but less than 60 percent impaired, limited or restricted  [facility] 11/05/2015, 10:45 AM  Marica Otter, OTR/L [phone] 11/05/2015

## 2015-11-05 NOTE — Progress Notes (Signed)
Pt back from CT

## 2015-11-05 NOTE — Progress Notes (Signed)
Pt back from echo

## 2015-11-05 NOTE — Progress Notes (Signed)
Neuro team made aware of CT results.

## 2015-11-05 NOTE — Progress Notes (Signed)
Pt having difficulty urinating on the bed pan. Pt has not been put on bedban 3 times, pt finally urinated 3rd time.

## 2015-11-05 NOTE — Progress Notes (Signed)
PROGRESS NOTE    SHAI RASMUSSEN  ZOX:[id number] DOB: 09/22/1932 DOA: 11/04/2015 PCP: Delton Prairie, MD     Assessment & Plan:   Principal Problem:   Stroke-like symptoms Active Problems:   TIA (transient ischemic attack)   Diabetes (HCC)   Bradycardia   Hypertension  #1 acute right basal ganglia infarct in the setting of subacute right occipital and pontine infarcts Likely secondary to small vessel disease. Patient with some progressive neurological worsening this morning with left facial tingling and numbness as well as worsening left-sided weakness. Patient given a bolus of normal saline 500 mL 1 in order to lay flat in bed. Repeat head CT ordered per neurology. MRI head with small acute right basal ganglia infarcts. Subacute/chronic right of center point in infarcts. MRA would right PCA occlusion. No major branch of flow-limiting. Left super clean urine ICA 1.5 mm aneurysm. CT angiogram of head and neck with right PCA occlusion. Thrombosis versus irregular plaque proximal left ICA. Cervical carotid atherosclerosis right 20% left 50%. Old right occipital infarct. 2-D echo which was done with a EF of 55-60% with no source of emboli. Fasting lipid panel with LDL of 100. Continue Zocor. Hemoglobin A1c pending. Continue aspirin 325 mg daily for secondary stroke prevention. Risk factor modification.  #2 hypertensive urgency On presentation patient noted to have a blood pressure of 211/110 with neurological symptoms. Patient given lip when necessary labetalol. With worsening neurological symptoms this morning patient's blood pressure went up to systolic of 222. Patient has been started on Norvasc 5 mg daily. Keep systolic blood pressure less than 200. May need to increase Norvasc to 10 mg daily. Follow for now.  #3 hyperlipidemia LDL of 100. Goal LDL 70. Patient's been started on Zocor.  #4 diabetes mellitus Hemoglobin A1c pending. CBGs have ranged from 143 -174.  #5 left groin  pain Likely musculoskeletal in nature. Pain with lifting, abduction and adduction of left lower extremity. Place on scheduled ibuprofen. Add PPI.  #6 bradycardia On admission heart rate in the 50s to 60s patient had received labetalol on route. Monitor closely with when necessary labetalol.    DVT prophylaxis: Lovenox Code Status: Full Family Communication: Updated patient and son at bedside. Disposition Plan: Pending stroke workup. Home versus SNF.   Consultants:   Neurology: Dr. Hilda Blades 11/04/2015   Procedures:   2-D echo 11/05/2015  CT head 11/05/2015, 11/04/2015   CT angiogram head and neck 11/04/2015  MRI head MRA head 11/04/2015  Antimicrobials:   None   Subjective: Patient complaining of left groin pain with movement of left lower extremity. No chest pain. No shortness of breath. Patient states tingling and numbness on face improving since this morning. Patient states left-sided weakness improving since this morning area.  Objective: Vitals:   11/05/15 1040 11/05/15 1114 11/05/15 1234 11/05/15 1300  BP:  (!) 185/70 (!) 191/76 (!) 222/96  Pulse:  71 74   Resp:  19 18   Temp:      TempSrc:      SpO2: 95% 93% 95%    No intake or output data in the 24 hours ending 11/05/15 1337 There were no vitals filed for this visit.  Examination:  General exam: Appears calm and comfortable.Laying flat  Respiratory system: Clear to auscultation anterior lung fields. Respiratory effort normal. Cardiovascular system: S1 & S2 heard, RRR. No JVD, murmurs, rubs, gallops or clicks. No pedal edema. Gastrointestinal system: Abdomen is nondistended, soft and nontender. No organomegaly or masses felt. Normal bowel sounds heard.  Central nervous system: Alert and oriented. No focal neurological deficits. Extremities: Symmetric 5 x 5 power. Skin: No rashes, lesions or ulcers Psychiatry: Judgement and insight appear normal. Mood & affect appropriate.     Data Reviewed: I have  personally reviewed following labs and imaging studies  CBC:  Recent Labs Lab 11/04/15 0839 11/04/15 1336 11/05/15 0938  WBC 6.2  --  5.6  NEUTROABS 4.0  --  3.7  HGB 15.4 15.3* 14.0  HCT 44.9 45.0 44.1  MCV 87.9  --  92.3  PLT 165  --  183   Basic Metabolic Panel:  Recent Labs Lab 11/04/15 0912 11/04/15 1336 11/05/15 0938  NA 139 140 137  K 5.0 3.9 4.0  CL 103 102 103  CO2 30  --  26  GLUCOSE 199* 191* 153*  BUN 13 10 8   CREATININE 0.85 0.70 0.83  CALCIUM 9.8  --  9.3  MG  --   --  2.1   GFR: Estimated Creatinine Clearance: 57.9 mL/min (by C-G formula based on SCr of 0.83 mg/dL). Liver Function Tests:  Recent Labs Lab 11/04/15 0912  AST 20  ALT 15  ALKPHOS 72  BILITOT 0.9  PROT 7.9  ALBUMIN 4.3   No results for input(s): LIPASE, AMYLASE in the last 168 hours. No results for input(s): AMMONIA in the last 168 hours. Coagulation Profile:  Recent Labs Lab 11/04/15 1317  INR 1.01   Cardiac Enzymes: No results for input(s): CKTOTAL, CKMB, CKMBINDEX, TROPONINI in the last 168 hours. BNP (last 3 results) No results for input(s): PROBNP in the last 8760 hours. HbA1C: No results for input(s): HGBA1C in the last 72 hours. CBG:  Recent Labs Lab 11/04/15 0839 11/04/15 1755 11/04/15 2125 11/05/15 0618 11/05/15 1057  GLUCAP 148* 159* 174* 158* 143*   Lipid Profile:  Recent Labs  11/05/15 0549  CHOL 169  HDL 46  LDLCALC 100*  TRIG 117  CHOLHDL 3.7   Thyroid Function Tests: No results for input(s): TSH, T4TOTAL, FREET4, T3FREE, THYROIDAB in the last 72 hours. Anemia Panel: No results for input(s): VITAMINB12, FOLATE, FERRITIN, TIBC, IRON, RETICCTPCT in the last 72 hours. Sepsis Labs: No results for input(s): PROCALCITON, LATICACIDVEN in the last 168 hours.  No results found for this or any previous visit (from the past 240 hour(s)).       Radiology Studies: Ct Angio Head W Or Wo Contrast  Result Date: 11/04/2015 CLINICAL DATA:   Left-sided numbness. EXAM: CT ANGIOGRAPHY HEAD AND NECK TECHNIQUE: Multidetector CT imaging of the head and neck was performed using the standard protocol during bolus administration of intravenous contrast. Multiplanar CT image reconstructions and MIPs were obtained to evaluate the vascular anatomy. Carotid stenosis measurements (when applicable) are obtained utilizing NASCET criteria, using the distal internal carotid diameter as the denominator. CONTRAST:  75 cc Isovue 370 intravenous COMPARISON:  Noncontrast head CT from earlier today FINDINGS: CTA NECK Aortic arch: Atheromatous calcifications. No aneurysm or dissection. 3 vessel branching. Right carotid system: Mixed calcified and noncalcified plaque at the bifurcation with 20% maximal proximal ICA stenosis. Left carotid system: Atherosclerotic calcification primarily at the common carotid bifurcation which is mixed calcified and low-density. Stenosis measures up to 50% when measured coronally at the ICA origin. There is a low-density luminal filling defect along the posterior and lateral walls which appears to project into the lumen, as seen with thrombus. No evidence of dissection. Vertebral arteries:No proximal subclavian artery stenosis. Smooth and widely patent vertebral arteries with mild dominance on  the left. Skeleton: No acute or aggressive process. Cervical disc and facet degeneration. Other neck: No incidental mass or adenopathy. Upper chest: Clear apical lungs CTA HEAD Anterior circulation: Carotid siphon atherosclerotic luminal irregularity and calcification. Mild to moderate right mid cavernous segment narrowing. No major branch occlusion. There is diffuse atherosclerotic irregularity of medium size vessels. Negative for aneurysm. Posterior circulation: Mild left vertebral artery dominance. Standard vertebrobasilar branching. There is a critical right P1 segment stenosis with occluded PCA near the P1 2 junction (no post communicating artery).  Negative for aneurysm. Venous sinuses: Patent Anatomic variants: Anterior communicating artery. No visible posterior communicating arteries. Delayed phase: Remote appearing right occipital pole infarct. As noted previously there may be more recent marginal ischemia. No visible thalamic infarct. Likely chronic pontomidbrain junction lacune. These results were called by telephone at the time of interpretation on 11/04/2015 at 10:36 am to Dr. Lacretia Nicks , who verbally acknowledged these results. IMPRESSION: 1. Proximal right PCA occlusion. There is a chronic appearing right occipital pole infarct; the level of occlusion puts a much greater area at risk. 2. Cervical carotid atherosclerosis with proximal ICA stenosis of approximately 20% on the right and 50% on the left. Low-density thrombus or irregular plaque projects into the proximal left ICA lumen. 3. Diffuse intracranial atherosclerosis. Electronically Signed   By: Marnee Spring M.D.   On: 11/04/2015 10:45   Dg Chest 2 View  Result Date: 11/04/2015 CLINICAL DATA:  TIA, left-sided weakness EXAM: CHEST  2 VIEW COMPARISON:  None. FINDINGS: Cardiomediastinal silhouette is unremarkable. No acute infiltrate or pleural effusion. No pulmonary edema. Atherosclerotic calcifications of carotid siphon. Degenerative changes mid and lower thoracic spine. IMPRESSION: No active cardiopulmonary disease. Electronically Signed   By: Natasha Mead M.D.   On: 11/04/2015 14:30   Ct Head Wo Contrast  Result Date: 11/04/2015 CLINICAL DATA:  Code stroke EXAM: CT HEAD WITHOUT CONTRAST TECHNIQUE: Contiguous axial images were obtained from the base of the skull through the vertex without intravenous contrast. COMPARISON:  04/09/2013 FINDINGS: No skull fracture is noted. Paranasal sinuses and mastoid air cells are unremarkable. No intracranial hemorrhage, mass effect or midline shift. Mild cerebral atrophy. Mild periventricular white matter decreased attenuation probable due to  chronic small vessel ischemic changes. Bilateral basal ganglia punctate calcifications are again noted. There is interval old appearing infarct in right occipital lobe axial image 17 measures 1.6 cm. Axial image 15 there is small area of decreased attenuation in right occipital lobe. New ischemia cannot be excluded. Clinical correlation is necessary. Further correlation with MRI is recommended as clinically warranted. IMPRESSION: There is interval old appearing infarct in right occipital lobe axial image 17 measures 1.6 cm. Axial image 15 there is small area of decreased attenuation in right occipital lobe. New ischemia cannot be excluded. Clinical correlation is necessary. Further correlation with MRI is recommended as clinically warranted. No intracranial hemorrhage, mass effect or midline shift. These results were called by telephone at the time of interpretation on 11/04/2015 at 9:01 am to Dr. Lacretia Nicks , who verbally acknowledged these results. Electronically Signed   By: Natasha Mead M.D.   On: 11/04/2015 09:02   Ct Angio Neck W And/or Wo Contrast  Result Date: 11/04/2015 CLINICAL DATA:  Left-sided numbness. EXAM: CT ANGIOGRAPHY HEAD AND NECK TECHNIQUE: Multidetector CT imaging of the head and neck was performed using the standard protocol during bolus administration of intravenous contrast. Multiplanar CT image reconstructions and MIPs were obtained to evaluate the vascular anatomy. Carotid stenosis measurements (when  applicable) are obtained utilizing NASCET criteria, using the distal internal carotid diameter as the denominator. CONTRAST:  75 cc Isovue 370 intravenous COMPARISON:  Noncontrast head CT from earlier today FINDINGS: CTA NECK Aortic arch: Atheromatous calcifications. No aneurysm or dissection. 3 vessel branching. Right carotid system: Mixed calcified and noncalcified plaque at the bifurcation with 20% maximal proximal ICA stenosis. Left carotid system: Atherosclerotic calcification  primarily at the common carotid bifurcation which is mixed calcified and low-density. Stenosis measures up to 50% when measured coronally at the ICA origin. There is a low-density luminal filling defect along the posterior and lateral walls which appears to project into the lumen, as seen with thrombus. No evidence of dissection. Vertebral arteries:No proximal subclavian artery stenosis. Smooth and widely patent vertebral arteries with mild dominance on the left. Skeleton: No acute or aggressive process. Cervical disc and facet degeneration. Other neck: No incidental mass or adenopathy. Upper chest: Clear apical lungs CTA HEAD Anterior circulation: Carotid siphon atherosclerotic luminal irregularity and calcification. Mild to moderate right mid cavernous segment narrowing. No major branch occlusion. There is diffuse atherosclerotic irregularity of medium size vessels. Negative for aneurysm. Posterior circulation: Mild left vertebral artery dominance. Standard vertebrobasilar branching. There is a critical right P1 segment stenosis with occluded PCA near the P1 2 junction (no post communicating artery). Negative for aneurysm. Venous sinuses: Patent Anatomic variants: Anterior communicating artery. No visible posterior communicating arteries. Delayed phase: Remote appearing right occipital pole infarct. As noted previously there may be more recent marginal ischemia. No visible thalamic infarct. Likely chronic pontomidbrain junction lacune. These results were called by telephone at the time of interpretation on 11/04/2015 at 10:36 am to Dr. Lacretia Nicks , who verbally acknowledged these results. IMPRESSION: 1. Proximal right PCA occlusion. There is a chronic appearing right occipital pole infarct; the level of occlusion puts a much greater area at risk. 2. Cervical carotid atherosclerosis with proximal ICA stenosis of approximately 20% on the right and 50% on the left. Low-density thrombus or irregular plaque projects  into the proximal left ICA lumen. 3. Diffuse intracranial atherosclerosis. Electronically Signed   By: Marnee Spring M.D.   On: 11/04/2015 10:45   Mr Brain Wo Contrast  Result Date: 11/04/2015 CLINICAL DATA:  Numbness in the left face and left upper extremity. Left leg weakness. Symptoms have improved. Hypertensive. EXAM: MRI HEAD WITHOUT CONTRAST MRA HEAD WITHOUT CONTRAST TECHNIQUE: Multiplanar, multiecho pulse sequences of the brain and surrounding structures were obtained without intravenous contrast. Angiographic images of the head were obtained using MRA technique without contrast. COMPARISON:  Head CT and CTA earlier today FINDINGS: MRI HEAD FINDINGS There are small acute right lateral lenticulostriate territory infarcts involving the posterior lentiform nucleus, corona radiata, and caudate body. There is a chronic infarct in the right central pons with associated chronic blood products resulting and mild artifact on diffusion imaging. There is a small chronic right occipital cortical infarct with associated chronic blood products. No mass, midline shift, or extra-axial fluid collection is seen. Cerebral atrophy is within normal limits for age. Small foci of T2 hyperintensity scattered in the cerebral white matter are nonspecific but compatible with minimal chronic small vessel ischemic disease. A dilated perivascular space is noted inferiorly in the right basal ganglia. Orbits are unremarkable. Paranasal sinuses and mastoid air cells are clear. Major intracranial vascular flow voids are preserved. No focal osseous lesion is identified. MRA HEAD FINDINGS The visualized distal vertebral arteries are patent to the basilar with the left being dominant. PICA origins, left  AICA origin, and SCA origins are patent. A right AICA is not clearly identified. The basilar artery is widely patent. The left PCA is patent without evidence of significant proximal stenosis. A severe right P1 stenosis is again seen with  occlusion of the right PCA near the P1 - P2 junction. The internal carotid arteries are patent from skullbase to carotid termini. There is atherosclerotic carotid siphon irregularity bilaterally. There is mild right supraclinoid ICA stenosis. There is a 1.5 mm medial outpouching from the proximal left supraclinoid ICA in the superior hypophyseal region compatible with a tiny aneurysm. ACAs and MCAs are patent without evidence of major branch occlusion or significant proximal stenosis. IMPRESSION: 1. Small acute right basal ganglia region infarcts. 2. Chronic infarcts in the right occipital lobe and pons. 3. Proximal right PCA occlusion as described on earlier CTA. 4. No major branch occlusion or flow limiting proximal stenosis in the anterior circulation. Mild right supraclinoid ICA stenosis. 5. 1.5 mm left supraclinoid ICA aneurysm. Electronically Signed   By: Sebastian AcheAllen  Grady M.D.   On: 11/04/2015 21:16   Mr Maxine GlennMra Head/brain GNWo Cm  Result Date: 11/04/2015 CLINICAL DATA:  Numbness in the left face and left upper extremity. Left leg weakness. Symptoms have improved. Hypertensive. EXAM: MRI HEAD WITHOUT CONTRAST MRA HEAD WITHOUT CONTRAST TECHNIQUE: Multiplanar, multiecho pulse sequences of the brain and surrounding structures were obtained without intravenous contrast. Angiographic images of the head were obtained using MRA technique without contrast. COMPARISON:  Head CT and CTA earlier today FINDINGS: MRI HEAD FINDINGS There are small acute right lateral lenticulostriate territory infarcts involving the posterior lentiform nucleus, corona radiata, and caudate body. There is a chronic infarct in the right central pons with associated chronic blood products resulting and mild artifact on diffusion imaging. There is a small chronic right occipital cortical infarct with associated chronic blood products. No mass, midline shift, or extra-axial fluid collection is seen. Cerebral atrophy is within normal limits for age.  Small foci of T2 hyperintensity scattered in the cerebral white matter are nonspecific but compatible with minimal chronic small vessel ischemic disease. A dilated perivascular space is noted inferiorly in the right basal ganglia. Orbits are unremarkable. Paranasal sinuses and mastoid air cells are clear. Major intracranial vascular flow voids are preserved. No focal osseous lesion is identified. MRA HEAD FINDINGS The visualized distal vertebral arteries are patent to the basilar with the left being dominant. PICA origins, left AICA origin, and SCA origins are patent. A right AICA is not clearly identified. The basilar artery is widely patent. The left PCA is patent without evidence of significant proximal stenosis. A severe right P1 stenosis is again seen with occlusion of the right PCA near the P1 - P2 junction. The internal carotid arteries are patent from skullbase to carotid termini. There is atherosclerotic carotid siphon irregularity bilaterally. There is mild right supraclinoid ICA stenosis. There is a 1.5 mm medial outpouching from the proximal left supraclinoid ICA in the superior hypophyseal region compatible with a tiny aneurysm. ACAs and MCAs are patent without evidence of major branch occlusion or significant proximal stenosis. IMPRESSION: 1. Small acute right basal ganglia region infarcts. 2. Chronic infarcts in the right occipital lobe and pons. 3. Proximal right PCA occlusion as described on earlier CTA. 4. No major branch occlusion or flow limiting proximal stenosis in the anterior circulation. Mild right supraclinoid ICA stenosis. 5. 1.5 mm left supraclinoid ICA aneurysm. Electronically Signed   By: Sebastian AcheAllen  Grady M.D.   On: 11/04/2015 21:16  Scheduled Meds: .  stroke: mapping our early stages of recovery book   Does not apply Once  . aspirin  300 mg Rectal Daily   Or  . aspirin EC  325 mg Oral Daily  . enoxaparin (LOVENOX) injection  40 mg Subcutaneous Q24H  . insulin aspart   0-15 Units Subcutaneous TID WC  . insulin aspart  0-5 Units Subcutaneous QHS  . simvastatin  20 mg Oral q1800   Continuous Infusions: . sodium chloride 70 mL/hr at 11/05/15 1008     LOS: 0 days    Time spent: 40 minutes    Naoki Migliaccio, MD [facility] Pager [phone]  If 7PM-7AM, please contact night-coverage [email or web address] Password [email or web address] 11/05/2015, 1:37 PM

## 2015-11-05 NOTE — Progress Notes (Signed)
Neuro team made aware of pts increase in BP, breathing changes, and groin pain.

## 2015-11-05 NOTE — Evaluation (Signed)
Clinical/Bedside Swallow Evaluation Patient Details  Name: Margaret Mcintosh MRN: [id number] Date of Birth: 11/03/1932  Today's Date: 11/05/2015 Time: SLP Start Time (ACUTE ONLY): 1240 SLP Stop Time (ACUTE ONLY): 1303 SLP Time Calculation (min) (ACUTE ONLY): 23 min  Past Medical History:  Past Medical History:  Diagnosis Date  . Arthritis   . Cataract   . Complication of anesthesia    "takes a long time to wake me up" (11/04/2015)  . Diabetes mellitus (HCC)   . GERD (gastroesophageal reflux disease)   . Glaucoma   . Hypertension    Past Surgical History:  Past Surgical History:  Procedure Laterality Date  . ANKLE FRACTURE SURGERY Left 2000   "screw is still there" (11/04/2015)  . APPENDECTOMY  ~1960  . FRACTURE SURGERY    . VAGINAL HYSTERECTOMY  1973   HPI:  Pt is an 82 y.o. female with PMH of hypertension, diabetes, presents to the emergency Department chief complaint left-sided weakness/gait disturbance. MRI showed very small R basal ganglia infarct; chronic infarcts in R occipital lobe and pons. Pt initially passed RN swallow screen, later presented with L facial droop, slurred speech, decreased sensation, NIH went from 1 to a 7. Bedside swallow eval ordered.   Assessment / Plan / Recommendation Clinical Impression  Pt presents with functional oropharyngeal swallow marked by adequate oral control and manipulation, brisk swallow response, no overt s/s of aspiration, despite less than optimal positioning due to [facility] restrictions.  Recommend advancing diet to regular, thin liquids.  No SLP f/u needed.     Aspiration Risk  No limitations    Diet Recommendation     Medication Administration: Whole meds with liquid    Other  Recommendations Oral Care Recommendations: Oral care BID   Follow up Recommendations  None    Frequency and Duration            Prognosis        Swallow Study   General Date of Onset: 11/05/15 HPI: Pt is an 82 y.o. female with PMH of  hypertension, diabetes, presents to the emergency Department chief complaint left-sided weakness/gait disturbance. MRI showed very small R basal ganglia infarct; chronic infarcts in R occipital lobe and pons. Pt initially passed RN swallow screen, later presented with L facial droop, slurred speech, decreased sensation, NIH went from 1 to a 7. Bedside swallow eval ordered. Type of Study: Bedside Swallow Evaluation Previous Swallow Assessment: none in chart Diet Prior to this Study: NPO Temperature Spikes Noted: No Respiratory Status: Room air History of Recent Intubation: No Behavior/Cognition: Alert;Cooperative Oral Cavity Assessment: Within Functional Limits Oral Care Completed by SLP: No Oral Cavity - Dentition: Adequate natural dentition Vision: Functional for self-feeding Self-Feeding Abilities: Able to feed self Patient Positioning: Partially reclined Baseline Vocal Quality: Normal Volitional Cough: Strong Volitional Swallow: Able to elicit    Oral/Motor/Sensory Function Overall Oral Motor/Sensory Function: Mild impairment Facial Symmetry: Suspected CN VII (facial) dysfunction;Abnormal symmetry left   Ice Chips Ice chips: Within functional limits   Thin Liquid Thin Liquid: Within functional limits Presentation: Cup;Straw    Nectar Thick Nectar Thick Liquid: Not tested   Honey Thick Honey Thick Liquid: Not tested   Puree Puree: Within functional limits   Solid   GO   Solid: Not tested    Functional Assessment Tool Used: clinical judgment Functional Limitations: Swallowing Swallow Current Status (W0981): 0 percent impaired, limited or restricted Swallow Goal Status (X9147): 0 percent impaired, limited or restricted Swallow Discharge Status (W2956): 0 percent impaired,  limited or restricted  Bevan Vu L. Samson Fredericouture, [place] CCC/SLP Pager [phone]  Blenda MountsCouture, Daevion Navarette Laurice 11/05/2015,1:07 PM

## 2015-11-05 NOTE — Progress Notes (Signed)
SLP Cancellation Note  Patient Details Name: Margaret Mcintosh MRN: [id number] DOB: May 28, 1932   Cancelled treatment:       Reason Eval/Treat Not Completed: Patient at procedure or test/unavailable. Pt on bedpan with transport waiting outside door to take pt to procedure or test. Will reattempt this afternoon.    [place]ungle[place]ak, Amy K, MA, CCC-SLP 11/05/2015, 11:26 AM [phone]

## 2015-11-05 NOTE — Progress Notes (Signed)
STROKE TEAM PROGRESS NOTE   HISTORY OF PRESENT ILLNESS (per record) Margaret Mcintosh is an 82 y.o. female who was normal last night at 2300 hours (LKW 11/04/2015 at 0000). She went to bed and awoke this AM at 0430 and noted she was off balance and listing to the left. She staggered back to bed and awoke at 0700 hours. Symptoms of off balance did not resolve and she felt her left arm was very weak. She called EMS and was brought to [facility]. There a MD called IR here at [facility]. There was confusion as IR stated the chronic PCA was nothing they would intervene on and if they felt it was a wake up stroke she would need to be seen by a neurohospitalist to evaluate for wake up stroke. For some unknown reason Neurohospitalist at [facility] ( Dr. Loretha BrasilZeylikman) was not consulted and she was transferred to [facility]. ON arrival she had no symptoms and NIHSS of 0. Patient was not administered IV t-PA secondary to admitting outside of the window. She was admitted for further evaluation and treatment.   SUBJECTIVE (INTERVAL HISTORY) Her family and ST are at the bedside.  Overall she feels her condition is gradually worsening. Started with numbness in face and arm this am. Has progressed to include L hand and more recently her leg. She received IVF bolus, HOB down flat. BP has greadually increased post bolus. had a fall at works 3-4 months ago, per Dr. Pearlean BrownieSethi, likely a result of her recent occipital infarct. She was unaware of. Dr. Pearlean BrownieSethi spent time talking with patient and family about diagnosis and prognosis. Workup underway   OBJECTIVE Temp:  [98 F (36.7 C)-99.1 F (37.3 C)] 98 F (36.7 C) (08/11 1016) Pulse Rate:  [54-74] 74 (08/11 1234) Cardiac Rhythm: Normal sinus rhythm (08/11 0700) Resp:  [16-20] 18 (08/11 1234) BP: (132-194)/(52-84) 191/76 (08/11 1234) SpO2:  [92 %-98 %] 95 % (08/11 1234) FiO2 (%):  [21 %] 21 % (08/10 1323)  CBC:  Recent Labs Lab 11/04/15 0839 11/04/15 1336 11/05/15 0938  WBC 6.2   --  5.6  NEUTROABS 4.0  --  3.7  HGB 15.4 15.3* 14.0  HCT 44.9 45.0 44.1  MCV 87.9  --  92.3  PLT 165  --  183    Basic Metabolic Panel:  Recent Labs Lab 11/04/15 0912 11/04/15 1336 11/05/15 0938  NA 139 140 137  K 5.0 3.9 4.0  CL 103 102 103  CO2 30  --  26  GLUCOSE 199* 191* 153*  BUN 13 10 8   CREATININE 0.85 0.70 0.83  CALCIUM 9.8  --  9.3  MG  --   --  2.1    Lipid Panel:    Component Value Date/Time   CHOL 169 11/05/2015 0549   TRIG 117 11/05/2015 0549   HDL 46 11/05/2015 0549   CHOLHDL 3.7 11/05/2015 0549   VLDL 23 11/05/2015 0549   LDLCALC 100 (H) 11/05/2015 0549   HgbA1c: No results found for: HGBA1C Urine Drug Screen: No results found for: LABOPIA, COCAINSCRNUR, LABBENZ, AMPHETMU, THCU, LABBARB    IMAGING  Ct Angio Head W Or Wo Contrast  Result Date: 11/04/2015 CLINICAL DATA:  Left-sided numbness. EXAM: CT ANGIOGRAPHY HEAD AND NECK TECHNIQUE: Multidetector CT imaging of the head and neck was performed using the standard protocol during bolus administration of intravenous contrast. Multiplanar CT image reconstructions and MIPs were obtained to evaluate the vascular anatomy. Carotid stenosis measurements (when applicable) are obtained utilizing NASCET criteria,  using the distal internal carotid diameter as the denominator. CONTRAST:  75 cc Isovue 370 intravenous COMPARISON:  Noncontrast head CT from earlier today FINDINGS: CTA NECK Aortic arch: Atheromatous calcifications. No aneurysm or dissection. 3 vessel branching. Right carotid system: Mixed calcified and noncalcified plaque at the bifurcation with 20% maximal proximal ICA stenosis. Left carotid system: Atherosclerotic calcification primarily at the common carotid bifurcation which is mixed calcified and low-density. Stenosis measures up to 50% when measured coronally at the ICA origin. There is a low-density luminal filling defect along the posterior and lateral walls which appears to project into the lumen, as  seen with thrombus. No evidence of dissection. Vertebral arteries:No proximal subclavian artery stenosis. Smooth and widely patent vertebral arteries with mild dominance on the left. Skeleton: No acute or aggressive process. Cervical disc and facet degeneration. Other neck: No incidental mass or adenopathy. Upper chest: Clear apical lungs CTA HEAD Anterior circulation: Carotid siphon atherosclerotic luminal irregularity and calcification. Mild to moderate right mid cavernous segment narrowing. No major branch occlusion. There is diffuse atherosclerotic irregularity of medium size vessels. Negative for aneurysm. Posterior circulation: Mild left vertebral artery dominance. Standard vertebrobasilar branching. There is a critical right P1 segment stenosis with occluded PCA near the P1 2 junction (no post communicating artery). Negative for aneurysm. Venous sinuses: Patent Anatomic variants: Anterior communicating artery. No visible posterior communicating arteries. Delayed phase: Remote appearing right occipital pole infarct. As noted previously there may be more recent marginal ischemia. No visible thalamic infarct. Likely chronic pontomidbrain junction lacune. These results were called by telephone at the time of interpretation on 11/04/2015 at 10:36 am to Dr. Lacretia Nicks , who verbally acknowledged these results. IMPRESSION: 1. Proximal right PCA occlusion. There is a chronic appearing right occipital pole infarct; the level of occlusion puts a much greater area at risk. 2. Cervical carotid atherosclerosis with proximal ICA stenosis of approximately 20% on the right and 50% on the left. Low-density thrombus or irregular plaque projects into the proximal left ICA lumen. 3. Diffuse intracranial atherosclerosis. Electronically Signed   By: Marnee Spring M.D.   On: 11/04/2015 10:45   Dg Chest 2 View  Result Date: 11/04/2015 CLINICAL DATA:  TIA, left-sided weakness EXAM: CHEST  2 VIEW COMPARISON:  None. FINDINGS:  Cardiomediastinal silhouette is unremarkable. No acute infiltrate or pleural effusion. No pulmonary edema. Atherosclerotic calcifications of carotid siphon. Degenerative changes mid and lower thoracic spine. IMPRESSION: No active cardiopulmonary disease. Electronically Signed   By: Natasha Mead M.D.   On: 11/04/2015 14:30   Ct Head Wo Contrast  Result Date: 11/04/2015 CLINICAL DATA:  Code stroke EXAM: CT HEAD WITHOUT CONTRAST TECHNIQUE: Contiguous axial images were obtained from the base of the skull through the vertex without intravenous contrast. COMPARISON:  04/09/2013 FINDINGS: No skull fracture is noted. Paranasal sinuses and mastoid air cells are unremarkable. No intracranial hemorrhage, mass effect or midline shift. Mild cerebral atrophy. Mild periventricular white matter decreased attenuation probable due to chronic small vessel ischemic changes. Bilateral basal ganglia punctate calcifications are again noted. There is interval old appearing infarct in right occipital lobe axial image 17 measures 1.6 cm. Axial image 15 there is small area of decreased attenuation in right occipital lobe. New ischemia cannot be excluded. Clinical correlation is necessary. Further correlation with MRI is recommended as clinically warranted. IMPRESSION: There is interval old appearing infarct in right occipital lobe axial image 17 measures 1.6 cm. Axial image 15 there is small area of decreased attenuation in right occipital lobe.  New ischemia cannot be excluded. Clinical correlation is necessary. Further correlation with MRI is recommended as clinically warranted. No intracranial hemorrhage, mass effect or midline shift. These results were called by telephone at the time of interpretation on 11/04/2015 at 9:01 am to Dr. Lacretia Nicks , who verbally acknowledged these results. Electronically Signed   By: Natasha Mead M.D.   On: 11/04/2015 09:02   Ct Angio Neck W And/or Wo Contrast  Result Date: 11/04/2015 CLINICAL DATA:   Left-sided numbness. EXAM: CT ANGIOGRAPHY HEAD AND NECK TECHNIQUE: Multidetector CT imaging of the head and neck was performed using the standard protocol during bolus administration of intravenous contrast. Multiplanar CT image reconstructions and MIPs were obtained to evaluate the vascular anatomy. Carotid stenosis measurements (when applicable) are obtained utilizing NASCET criteria, using the distal internal carotid diameter as the denominator. CONTRAST:  75 cc Isovue 370 intravenous COMPARISON:  Noncontrast head CT from earlier today FINDINGS: CTA NECK Aortic arch: Atheromatous calcifications. No aneurysm or dissection. 3 vessel branching. Right carotid system: Mixed calcified and noncalcified plaque at the bifurcation with 20% maximal proximal ICA stenosis. Left carotid system: Atherosclerotic calcification primarily at the common carotid bifurcation which is mixed calcified and low-density. Stenosis measures up to 50% when measured coronally at the ICA origin. There is a low-density luminal filling defect along the posterior and lateral walls which appears to project into the lumen, as seen with thrombus. No evidence of dissection. Vertebral arteries:No proximal subclavian artery stenosis. Smooth and widely patent vertebral arteries with mild dominance on the left. Skeleton: No acute or aggressive process. Cervical disc and facet degeneration. Other neck: No incidental mass or adenopathy. Upper chest: Clear apical lungs CTA HEAD Anterior circulation: Carotid siphon atherosclerotic luminal irregularity and calcification. Mild to moderate right mid cavernous segment narrowing. No major branch occlusion. There is diffuse atherosclerotic irregularity of medium size vessels. Negative for aneurysm. Posterior circulation: Mild left vertebral artery dominance. Standard vertebrobasilar branching. There is a critical right P1 segment stenosis with occluded PCA near the P1 2 junction (no post communicating artery).  Negative for aneurysm. Venous sinuses: Patent Anatomic variants: Anterior communicating artery. No visible posterior communicating arteries. Delayed phase: Remote appearing right occipital pole infarct. As noted previously there may be more recent marginal ischemia. No visible thalamic infarct. Likely chronic pontomidbrain junction lacune. These results were called by telephone at the time of interpretation on 11/04/2015 at 10:36 am to Dr. Lacretia Nicks , who verbally acknowledged these results. IMPRESSION: 1. Proximal right PCA occlusion. There is a chronic appearing right occipital pole infarct; the level of occlusion puts a much greater area at risk. 2. Cervical carotid atherosclerosis with proximal ICA stenosis of approximately 20% on the right and 50% on the left. Low-density thrombus or irregular plaque projects into the proximal left ICA lumen. 3. Diffuse intracranial atherosclerosis. Electronically Signed   By: Marnee Spring M.D.   On: 11/04/2015 10:45   Mr Brain Wo Contrast  Result Date: 11/04/2015 CLINICAL DATA:  Numbness in the left face and left upper extremity. Left leg weakness. Symptoms have improved. Hypertensive. EXAM: MRI HEAD WITHOUT CONTRAST MRA HEAD WITHOUT CONTRAST TECHNIQUE: Multiplanar, multiecho pulse sequences of the brain and surrounding structures were obtained without intravenous contrast. Angiographic images of the head were obtained using MRA technique without contrast. COMPARISON:  Head CT and CTA earlier today FINDINGS: MRI HEAD FINDINGS There are small acute right lateral lenticulostriate territory infarcts involving the posterior lentiform nucleus, corona radiata, and caudate body. There is a chronic infarct  in the right central pons with associated chronic blood products resulting and mild artifact on diffusion imaging. There is a small chronic right occipital cortical infarct with associated chronic blood products. No mass, midline shift, or extra-axial fluid collection is  seen. Cerebral atrophy is within normal limits for age. Small foci of T2 hyperintensity scattered in the cerebral white matter are nonspecific but compatible with minimal chronic small vessel ischemic disease. A dilated perivascular space is noted inferiorly in the right basal ganglia. Orbits are unremarkable. Paranasal sinuses and mastoid air cells are clear. Major intracranial vascular flow voids are preserved. No focal osseous lesion is identified. MRA HEAD FINDINGS The visualized distal vertebral arteries are patent to the basilar with the left being dominant. PICA origins, left AICA origin, and SCA origins are patent. A right AICA is not clearly identified. The basilar artery is widely patent. The left PCA is patent without evidence of significant proximal stenosis. A severe right P1 stenosis is again seen with occlusion of the right PCA near the P1 - P2 junction. The internal carotid arteries are patent from skullbase to carotid termini. There is atherosclerotic carotid siphon irregularity bilaterally. There is mild right supraclinoid ICA stenosis. There is a 1.5 mm medial outpouching from the proximal left supraclinoid ICA in the superior hypophyseal region compatible with a tiny aneurysm. ACAs and MCAs are patent without evidence of major branch occlusion or significant proximal stenosis. IMPRESSION: 1. Small acute right basal ganglia region infarcts. 2. Chronic infarcts in the right occipital lobe and pons. 3. Proximal right PCA occlusion as described on earlier CTA. 4. No major branch occlusion or flow limiting proximal stenosis in the anterior circulation. Mild right supraclinoid ICA stenosis. 5. 1.5 mm left supraclinoid ICA aneurysm. Electronically Signed   By: Sebastian Ache M.D.   On: 11/04/2015 21:16   Mr Maxine Glenn Head/brain ZO Cm  Result Date: 11/04/2015 CLINICAL DATA:  Numbness in the left face and left upper extremity. Left leg weakness. Symptoms have improved. Hypertensive. EXAM: MRI HEAD WITHOUT  CONTRAST MRA HEAD WITHOUT CONTRAST TECHNIQUE: Multiplanar, multiecho pulse sequences of the brain and surrounding structures were obtained without intravenous contrast. Angiographic images of the head were obtained using MRA technique without contrast. COMPARISON:  Head CT and CTA earlier today FINDINGS: MRI HEAD FINDINGS There are small acute right lateral lenticulostriate territory infarcts involving the posterior lentiform nucleus, corona radiata, and caudate body. There is a chronic infarct in the right central pons with associated chronic blood products resulting and mild artifact on diffusion imaging. There is a small chronic right occipital cortical infarct with associated chronic blood products. No mass, midline shift, or extra-axial fluid collection is seen. Cerebral atrophy is within normal limits for age. Small foci of T2 hyperintensity scattered in the cerebral white matter are nonspecific but compatible with minimal chronic small vessel ischemic disease. A dilated perivascular space is noted inferiorly in the right basal ganglia. Orbits are unremarkable. Paranasal sinuses and mastoid air cells are clear. Major intracranial vascular flow voids are preserved. No focal osseous lesion is identified. MRA HEAD FINDINGS The visualized distal vertebral arteries are patent to the basilar with the left being dominant. PICA origins, left AICA origin, and SCA origins are patent. A right AICA is not clearly identified. The basilar artery is widely patent. The left PCA is patent without evidence of significant proximal stenosis. A severe right P1 stenosis is again seen with occlusion of the right PCA near the P1 - P2 junction. The internal carotid arteries are  patent from skullbase to carotid termini. There is atherosclerotic carotid siphon irregularity bilaterally. There is mild right supraclinoid ICA stenosis. There is a 1.5 mm medial outpouching from the proximal left supraclinoid ICA in the superior hypophyseal  region compatible with a tiny aneurysm. ACAs and MCAs are patent without evidence of major branch occlusion or significant proximal stenosis. IMPRESSION: 1. Small acute right basal ganglia region infarcts. 2. Chronic infarcts in the right occipital lobe and pons. 3. Proximal right PCA occlusion as described on earlier CTA. 4. No major branch occlusion or flow limiting proximal stenosis in the anterior circulation. Mild right supraclinoid ICA stenosis. 5. 1.5 mm left supraclinoid ICA aneurysm. Electronically Signed   By: Sebastian Ache M.D.   On: 11/04/2015 21:16   2-D echocardiogram - Left ventricle: The cavity size was normal. Wall thickness was normal. Systolic function was normal. The estimated ejection fraction was in the range of 55% to 60%. Wall motion was normal; there were no regional wall motion abnormalities. Doppler parameters are consistent with abnormal left ventricular relaxation (grade 1 diastolic dysfunction). - Mitral valve: Calcified annulus. Impressions:   Technically difficult; definity used; normal LV systolic function; grade 1 diastolic dysfunction; indeterminant LV filling pressure; trace TR.    PHYSICAL EXAM Pleasant elderly Caucasian lady currently not in distress. . Afebrile. Head is nontraumatic. Neck is supple without bruit.    Cardiac exam no murmur or gallop. Lungs are clear to auscultation. Distal pulses are well felt. Neurological Exam :  Awake alert oriented 3. Mild dysarthria. Pupils equal reactive. Extraocular movements full range without nystagmus. Blinks to threat right greater than left.Lezlie Lye were not visualized. Left lower facial weakness. Tongue midline. Left hemiplegia with left upper and lower extremity drift. Left upper extremity 2/5 strength in left lower extremity 3/5 strength. Diminished sensation on left hemibody. Normal strength sensation and coordination on the right side. Left plantar upgoing right downgoing. Gait was not tested. ASSESSMENT/PLAN Margaret Mcintosh is a 82 y.o. female with history of hypertension, diabetes presenting with left arm weakness, gait disturbance. She did not receive IV t-PA due to delay in arrival.   Stroke:  Non-dominant right basal ganglia infarct in setting of subacute right occipital and pontine infarcts. All infarcts secondary to small vessel disease    Progressive Neurologic orsening over the course of the day, consistent with small vessel infarct, but will repeat stat CT due to worsening  Patient ordered flat in bed with 500 mL normal saline bolus. This a.m. We'll give additional 500 mL bolus at time of rounds.  Resultant  Left hemiparesis, slurred speech  MRI  Small acute right basal ganglia infarcts. Subacute/chronic right occipital and pontine infarcts  MRA right PCA occlusion. No major branch or flow-limiting's diagnosis. Left supraclinoid ICA 1.5 mm aneurysm.  Repeat stat CT due to neurologic worsening pending   CT angio head and neck  Right PCA occlusion. Old right occipital infarct. Cervical carotid atherosclerosis, right 20%, left 50%. Thrombosis versus irregular plaque proximal left ICA.  2D Echo  EF 55-60%, no source of embolus  LDL 100  HgbA1c pending  Lovenox 40 mg sq daily for VTE prophylaxis  Diet NPO time specified. Speech therapist is at bedside, reevaluating swallow. Passed initial stroke swallow screen, but made nothing by mouth after neurologic worsening, this a.m.  No antithrombotic prior to admission, now on aspirin 325 mg daily  Patient counseled to be compliant with her antithrombotic medications  Ongoing aggressive stroke risk factor management  Therapy recommendations:  pending   Disposition:  pending   Hypertensive Urgency  Blood pressure 211/110 on presentation in setting of neurologic symptoms  Treated with labetalol en route to [facility]  Progressive elevation in setting of neurologic symptoms today, up to 222/90  Treat with labetolol prn  Add  Norvasc 5 mg by mouth  SBP goal < 200 Long-term BP goal normotensive  Hyperlipidemia  Home meds:  No statin  LDL 100, goal < 70  New Zocor 20 mg daily added  Continue statin at discharge  Diabetes  HgbA1c pending, goal < 7.0  Other Stroke Risk Factors  Advanced age  Other Active Problems  Bradycardia, heart rate 50-66  Hospital day # 0  Rhoderick Moody [facility] See Amion for Pager information 11/05/2015 5:47 PM  I have personally examined this patient, reviewed notes, independently viewed imaging studies, participated in medical decision making and plan of care. I have made any additions or clarifications directly to the above note. Agree with note above.  She presented with gait and balance and left-sided weakness secondary to right brain subcortical infarct from small vessel disease. She has had neurological worsening today likely due to infarct extension. Recommend she do a flat in bed today. IV hydration with normal saline. Check stat CT scan of the head to look for infarct extension of hemorrhage. IV labetalol for blood pressure with goal systolic blood pressure below 782. Long discussion with patient and family members at the bedside and answered questions. Greater than 50% and in this 35 minute visit was spent on counseling and coordination of care about stroke risk, prevention and treatment  Delia Heady, MD Medical Director [facility] Pager: [phone] 11/05/2015 5:56 PM    To contact Stroke Continuity provider, please refer to [email or web address]. After hours, contact General Neurology

## 2015-11-05 NOTE — Progress Notes (Signed)
PT Cancellation Note  Patient Details Name: Margaret LawlessShelvia S Mcintosh MRN: [id number] DOB: [birth year]   Cancelled Treatment:    Reason Eval/Treat Not Completed: Medical issues which prohibited therapy; patient with evolving symptoms.  Will check later today.   Elray McgregorCynthia Byron Tipping 11/05/2015, 11:42 AM Margaret Lawlessyndi Elese Rane, PT [phone] 11/05/2015

## 2015-11-05 NOTE — Progress Notes (Signed)
PT Cancellation Note  Patient Details Name: Margaret LawlessShelvia S Lucier MRN: [id number] DOB: 05-01-1932   Cancelled Treatment:    Reason Eval/Treat Not Completed: Medical issues which prohibited therapy; RN reports MD still wants pt on bedrest.  Will attempt to see tomorrow.    Elray McgregorCynthia Tobias Avitabile 11/05/2015, 3:22 PM  Margaret Lawlessyndi Darriel Utter, [place] [phone] 11/05/2015

## 2015-11-05 NOTE — Progress Notes (Addendum)
Pt was never a smoker, but lived with a smoker for 40 years. O2 sat has intermittently dropped to 92%, pt reports that her "breathing feels tighter because she can't sit up".  Lung sounds clear.   Pt also saying that she is having left groin pain since yesterday, tender upon palpation. Left pedal pulse +2, cap refill <3 seconds, foot warm, sensation intact.

## 2015-11-06 DIAGNOSIS — I160 Hypertensive urgency: Secondary | ICD-10-CM

## 2015-11-06 DIAGNOSIS — E119 Type 2 diabetes mellitus without complications: Secondary | ICD-10-CM

## 2015-11-06 DIAGNOSIS — R1032 Left lower quadrant pain: Secondary | ICD-10-CM

## 2015-11-06 LAB — BASIC METABOLIC PANEL
ANION GAP: 11 (ref 5–15)
BUN: 6 mg/dL (ref 6–20)
CALCIUM: 9 mg/dL (ref 8.9–10.3)
CHLORIDE: 103 mmol/L (ref 101–111)
CO2: 23 mmol/L (ref 22–32)
Creatinine, Ser: 0.61 mg/dL (ref 0.44–1.00)
GFR calc non Af Amer: >60 mL/min (ref >60)
Glucose, Bld: 145 mg/dL — ABNORMAL HIGH (ref 65–99)
Potassium: 4 mmol/L (ref 3.5–5.1)
SODIUM: 137 mmol/L (ref 135–145)

## 2015-11-06 LAB — GLUCOSE, CAPILLARY
Glucose-Capillary: 162 mg/dL — ABNORMAL HIGH (ref 65–99)
Glucose-Capillary: 158 mg/dL — ABNORMAL HIGH (ref 65–99)
Glucose-Capillary: 184 mg/dL — ABNORMAL HIGH (ref 65–99)
Glucose-Capillary: 148 mg/dL — ABNORMAL HIGH (ref 65–99)

## 2015-11-06 LAB — HEMOGLOBIN A1C
Hgb A1c MFr Bld: 7.8 % — ABNORMAL HIGH (ref 4.8–5.6)
MEAN PLASMA GLUCOSE: 177 mg/dL

## 2015-11-06 NOTE — [place] FL2 (Signed)
[place] MEDICAID FL2 LEVEL OF CARE SCREENING TOOL     IDENTIFICATION  Patient Name: Margaret LawlessShelvia S Mcintosh Birthdate: 12-27-1932 Sex: female Admission Date (Current Location): 11/04/2015  [facility] [place]   Facility and Address:  [facility] [place], [address], [place], [place] [address]      Provider Number: [id number]  Attending Physician Name and Address:  Rodolph Bonganiel V Thompson, MD  Relative Name and Phone Number:       Current Level of Care: Hospital Recommended Level of Care: Skilled Nursing Facility Prior Approval Number:    Date Approved/Denied: 11/06/15 PASRR Number: [id number]  Discharge Plan: SNF    Current Diagnoses: Patient Active Problem List   Diagnosis Date Noted  . Diabetes mellitus with HbA1C goal between 7 and 8   . Groin pain   . Hypertensive urgency   . Stroke-like symptom 11/05/2015  . TIA (transient ischemic attack) 11/04/2015  . Stroke-like symptoms 11/04/2015  . Diabetes (HCC) 11/04/2015  . Bradycardia 11/04/2015  . Hypertension 11/04/2015  . Acute CVA (cerebrovascular accident) (HCC)   . Accelerated hypertension     Orientation RESPIRATION BLADDER Height & Weight     Self, Time, Situation, Place  Normal Continent Weight: 185 lb (83.9 kg) Height:  5\' 5"  (165.1 cm)  BEHAVIORAL SYMPTOMS/MOOD NEUROLOGICAL BOWEL NUTRITION STATUS      Continent Diet (Carb Modified)  AMBULATORY STATUS COMMUNICATION OF NEEDS Skin   Extensive Assist Verbally Normal                       Personal Care Assistance Level of Assistance  Dressing, Bathing Bathing Assistance: Limited assistance   Dressing Assistance: Limited assistance     Functional Limitations Info             SPECIAL CARE FACTORS FREQUENCY  PT (By licensed PT), OT (By licensed OT) (Sliding Scale Insulin)     PT Frequency: 5 OT Frequency: 5            Contractures Contractures Info: Not present    Additional Factors Info  Code  Status, Allergies Code Status Info: Full Allergies Info: Codeine, Diphenhydrdrammine HCL, other           Current Medications (11/06/2015):  This is the current hospital active medication list Current Facility-Administered Medications  Medication Dose Route Frequency Provider Last Rate Last Dose  . 0.9 %  sodium chloride infusion   Intravenous Continuous Layne BentonSharon L Biby, NP 70 mL/hr at 11/06/15 0918    . acetaminophen (TYLENOL) suppository 650 mg  650 mg Rectal Q4H PRN Gwenyth BenderKaren M Black, NP      . acetaminophen (TYLENOL) tablet 325-650 mg  325-650 mg Oral Q6H PRN Micki RileyPramod S Sethi, MD   650 mg at 11/05/15 1527  . amLODipine (NORVASC) tablet 5 mg  5 mg Oral Daily Micki RileyPramod S Sethi, MD   5 mg at 11/06/15 0926  . aspirin suppository 300 mg  300 mg Rectal Daily Layne BentonSharon L Biby, NP       Or  . aspirin EC tablet 325 mg  325 mg Oral Daily Layne BentonSharon L Biby, NP   325 mg at 11/06/15 0926  . enoxaparin (LOVENOX) injection 40 mg  40 mg Subcutaneous Q24H Gwenyth BenderKaren M Black, NP   40 mg at 11/06/15 1730  . ibuprofen (ADVIL,MOTRIN) tablet 400 mg  400 mg Oral TID Rodolph Bonganiel V Thompson, MD   400 mg at 11/06/15 1730  . insulin aspart (novoLOG) injection 0-15  Units  0-15 Units Subcutaneous TID WC Gwenyth Bender, NP   3 Units at 11/06/15 1731  . insulin aspart (novoLOG) injection 0-5 Units  0-5 Units Subcutaneous QHS Lesle Chris Black, NP      . labetalol (NORMODYNE) tablet 100 mg  100 mg Oral TID Micki Riley, MD   100 mg at 11/06/15 1730  . labetalol (NORMODYNE,TRANDATE) injection 10-20 mg  10-20 mg Intravenous Q10 min PRN Layne Benton, NP   20 mg at 11/05/15 1527  . nystatin (MYCOSTATIN/NYSTOP) topical powder   Topical BID Rodolph Bong, MD      . pantoprazole (PROTONIX) EC tablet 40 mg  40 mg Oral Q0600 Rodolph Bong, MD   40 mg at 11/06/15 0604  . senna-docusate (Senokot-S) tablet 1 tablet  1 tablet Oral QHS PRN Gwenyth Bender, NP      . simvastatin (ZOCOR) tablet 20 mg  20 mg Oral q1800 Rodolph Bong, MD   20 mg at  11/06/15 1730     Discharge Medications: Please see discharge summary for a list of discharge medications.  Relevant Imaging Results:  Relevant Lab Results:   Additional Information SSN  [id number] [place], [place]

## 2015-11-06 NOTE — Progress Notes (Signed)
STROKE TEAM PROGRESS NOTE   HISTORY OF PRESENT ILLNESS (per record) Margaret Mcintosh is an 82 y.o. female who was normal last night at 2300 hours (LKW 11/04/2015 at 0000). She went to bed and awoke this AM at 0430 and noted she was off balance and listing to the left. She staggered back to bed and awoke at 0700 hours. Symptoms of off balance did not resolve and she felt her left arm was very weak. She called EMS and was brought [facility]. [facility] a [facility]. There was confusion as IR stated the chronic PCA was nothing they would intervene on and if they felt it was a wake up stroke she would need to be seen by a neurohospitalist to evaluate for wake up stroke. For some unknown reason Neurohospitalist at [place] ( Dr. Loretha Brasil) was not consulted and [facility] was transferred [facility]. ON arrival she had no symptoms and NIHSS of 0. Patient was not administered IV t-PA secondary to admitting outside of the window. She was admitted for further evaluation and treatment.   SUBJECTIVE (INTERVAL HISTORY) Her son is at bedside.  She is starting to have more movement on the left side.      OBJECTIVE Temp:  [97.7 F (36.5 C)-98 F (36.7 C)] 98 F (36.7 C) (08/12 1327) Pulse Rate:  [67-78] 78 (08/12 1327) Cardiac Rhythm: Normal sinus rhythm (08/12 0700) Resp:  [18-20] 18 (08/12 1327) BP: (149-189)/(48-92) 149/48 (08/12 1327) SpO2:  [93 %-97 %] 97 % (08/12 1327)  CBC:   Recent Labs Lab 11/04/15 0839 11/04/15 1336 11/05/15 0938  WBC 6.2  --  5.6  NEUTROABS 4.0  --  3.7  HGB 15.4 15.3* 14.0  HCT 44.9 45.0 44.1  MCV 87.9  --  92.3  PLT 165  --  183    Basic Metabolic Panel:   Recent Labs Lab 11/05/15 0938 11/06/15 0501  NA 137 137  K 4.0 4.0  CL 103 103  CO2 26 23  GLUCOSE 153* 145*  BUN 8 6  CREATININE 0.83 0.61  CALCIUM 9.3 9.0  MG 2.1  --     Lipid Panel:     Component Value Date/Time   CHOL 169 11/05/2015 0549   TRIG 117 11/05/2015 0549   HDL 46  11/05/2015 0549   CHOLHDL 3.7 11/05/2015 0549   VLDL 23 11/05/2015 0549   LDLCALC 100 (H) 11/05/2015 0549   HgbA1c:  Lab Results  Component Value Date   HGBA1C 7.8 (H) 11/05/2015   Urine Drug Screen: No results found for: LABOPIA, COCAINSCRNUR, LABBENZ, AMPHETMU, THCU, LABBARB    IMAGING  Ct Head Wo Contrast  Result Date: 11/05/2015 CLINICAL DATA:  Recent stroke. Sudden onset worsening symptoms including left arm weakness. EXAM: CT HEAD WITHOUT CONTRAST TECHNIQUE: Contiguous axial images were obtained from the base of the skull through the vertex without intravenous contrast. COMPARISON:  Head CT and brain MRI from yesterday FINDINGS: Brain: Right corona radiata and caudate body infarcts by MRI are subtle by CT. No evidence of infarct extension or cortical involvement. No hemorrhagic conversion. Remote right PCA territory infarct affecting the occipital pole. No hydrocephalus or shift. Vascular: No hyperdense vessel .  Atherosclerotic calcification Skull: Negative for fracture or focal lesion. Sinuses/Orbits: No acute finding. Other: None. IMPRESSION: 1. Right subcortical infarcts on previous brain MRI are subtle by CT. No hemorrhagic conversion or evidence of infarct progression. 2. Remote right occipital pole infarct. Electronically Signed   By: Marja Kays  Watts M.D.   On: 11/05/2015 13:45   Mr Brain Wo Contrast  Result Date: 11/04/2015 CLINICAL DATA:  Numbness in the left face and left upper extremity. Left leg weakness. Symptoms have improved. Hypertensive. EXAM: MRI HEAD WITHOUT CONTRAST MRA HEAD WITHOUT CONTRAST TECHNIQUE: Multiplanar, multiecho pulse sequences of the brain and surrounding structures were obtained without intravenous contrast. Angiographic images of the head were obtained using MRA technique without contrast. COMPARISON:  Head CT and CTA earlier today FINDINGS: MRI HEAD FINDINGS There are small acute right lateral lenticulostriate territory infarcts involving the posterior  lentiform nucleus, corona radiata, and caudate body. There is a chronic infarct in the right central pons with associated chronic blood products resulting and mild artifact on diffusion imaging. There is a small chronic right occipital cortical infarct with associated chronic blood products. No mass, midline shift, or extra-axial fluid collection is seen. Cerebral atrophy is within normal limits for age. Small foci of T2 hyperintensity scattered in the cerebral white matter are nonspecific but compatible with minimal chronic small vessel ischemic disease. A dilated perivascular space is noted inferiorly in the right basal ganglia. Orbits are unremarkable. Paranasal sinuses and mastoid air cells are clear. Major intracranial vascular flow voids are preserved. No focal osseous lesion is identified. MRA HEAD FINDINGS The visualized distal vertebral arteries are patent to the basilar with the left being dominant. PICA origins, left AICA origin, and SCA origins are patent. A right AICA is not clearly identified. The basilar artery is widely patent. The left PCA is patent without evidence of significant proximal stenosis. A severe right P1 stenosis is again seen with occlusion of the right PCA near the P1 - P2 junction. The internal carotid arteries are patent from skullbase to carotid termini. There is atherosclerotic carotid siphon irregularity bilaterally. There is mild right supraclinoid ICA stenosis. There is a 1.5 mm medial outpouching from the proximal left supraclinoid ICA in the superior hypophyseal region compatible with a tiny aneurysm. ACAs and MCAs are patent without evidence of major branch occlusion or significant proximal stenosis. IMPRESSION: 1. Small acute right basal ganglia region infarcts. 2. Chronic infarcts in the right occipital lobe and pons. 3. Proximal right PCA occlusion as described on earlier CTA. 4. No major branch occlusion or flow limiting proximal stenosis in the anterior circulation.  Mild right supraclinoid ICA stenosis. 5. 1.5 mm left supraclinoid ICA aneurysm. Electronically Signed   By: Sebastian AcheAllen  Grady M.D.   On: 11/04/2015 21:16   Mr Maxine GlennMra Head/brain ZOWo Cm  Result Date: 11/04/2015 CLINICAL DATA:  Numbness in the left face and left upper extremity. Left leg weakness. Symptoms have improved. Hypertensive. EXAM: MRI HEAD WITHOUT CONTRAST MRA HEAD WITHOUT CONTRAST TECHNIQUE: Multiplanar, multiecho pulse sequences of the brain and surrounding structures were obtained without intravenous contrast. Angiographic images of the head were obtained using MRA technique without contrast. COMPARISON:  Head CT and CTA earlier today FINDINGS: MRI HEAD FINDINGS There are small acute right lateral lenticulostriate territory infarcts involving the posterior lentiform nucleus, corona radiata, and caudate body. There is a chronic infarct in the right central pons with associated chronic blood products resulting and mild artifact on diffusion imaging. There is a small chronic right occipital cortical infarct with associated chronic blood products. No mass, midline shift, or extra-axial fluid collection is seen. Cerebral atrophy is within normal limits for age. Small foci of T2 hyperintensity scattered in the cerebral white matter are nonspecific but compatible with minimal chronic small vessel ischemic disease. A dilated perivascular space  is noted inferiorly in the right basal ganglia. Orbits are unremarkable. Paranasal sinuses and mastoid air cells are clear. Major intracranial vascular flow voids are preserved. No focal osseous lesion is identified. MRA HEAD FINDINGS The visualized distal vertebral arteries are patent to the basilar with the left being dominant. PICA origins, left AICA origin, and SCA origins are patent. A right AICA is not clearly identified. The basilar artery is widely patent. The left PCA is patent without evidence of significant proximal stenosis. A severe right P1 stenosis is again seen  with occlusion of the right PCA near the P1 - P2 junction. The internal carotid arteries are patent from skullbase to carotid termini. There is atherosclerotic carotid siphon irregularity bilaterally. There is mild right supraclinoid ICA stenosis. There is a 1.5 mm medial outpouching from the proximal left supraclinoid ICA in the superior hypophyseal region compatible with a tiny aneurysm. ACAs and MCAs are patent without evidence of major branch occlusion or significant proximal stenosis. IMPRESSION: 1. Small acute right basal ganglia region infarcts. 2. Chronic infarcts in the right occipital lobe and pons. 3. Proximal right PCA occlusion as described on earlier CTA. 4. No major branch occlusion or flow limiting proximal stenosis in the anterior circulation. Mild right supraclinoid ICA stenosis. 5. 1.5 mm left supraclinoid ICA aneurysm. Electronically Signed   By: Sebastian Ache M.D.   On: 11/04/2015 21:16   2-D echocardiogram  - Left ventricle: The cavity size was normal. Wall thickness was normal. Systolic function was normal. The estimated ejection fraction was in the range of 55% to 60%. Wall motion was normal; there were no regional wall motion abnormalities. Doppler parameters are consistent with abnormal left ventricular relaxation (grade 1 diastolic dysfunction). - Mitral valve: Calcified annulus. Impressions:   Technically difficult; definity used; normal LV systolic function; grade 1 diastolic dysfunction; indeterminant LV filling pressure; trace TR.    PHYSICAL EXAM Pleasant elderly Caucasian lady currently not in distress. . Afebrile. Head is nontraumatic. Neck is supple without bruit.    Cardiac exam no murmur or gallop. Lungs are clear to auscultation. Distal pulses are well felt. Neurological Exam :  Awake alert oriented 3. Mild dysarthria. Pupils equal reactive. Extraocular movements full range without nystagmus. Blinks to threat right greater than left.Lezlie Lye were not visualized. Left  lower facial weakness. Tongue midline. Left hemiplegia with left upper and lower extremity drift. Left upper extremity 2/5 strength in left lower extremity 3/5 strength. Diminished sensation on left hemibody. Normal strength sensation and coordination on the right side. Left plantar upgoing right downgoing. Gait was not tested.   ASSESSMENT/PLAN Ms. Margaret Mcintosh is a 82 y.o. female with history of hypertension, diabetes presenting with left arm weakness, gait disturbance. She did not receive IV t-PA due to delay in arrival.   Stroke:  Non-dominant right basal ganglia infarct in setting of subacute right occipital and pontine infarcts. All infarcts secondary to small vessel disease    Progressive Neurologic orsening over the course of the day, consistent with small vessel infarct, but will repeat stat CT due to worsening  Patient ordered flat in bed with 500 mL normal saline bolus. This a.m. We'll give additional 500 mL bolus at time of rounds.  Resultant  Left hemiparesis, slurred speech  MRI  Small acute right basal ganglia infarcts. Subacute/chronic right occipital and pontine infarcts  MRA right PCA occlusion. No major branch or flow-limiting's diagnosis. Left supraclinoid ICA 1.5 mm aneurysm.  Repeat stat CT due to neurologic worsening pending  CT angio head and neck  Right PCA occlusion. Old right occipital infarct. Cervical carotid atherosclerosis, right 20%, left 50%. Thrombosis versus irregular plaque proximal left ICA.  2D Echo  EF 55-60%, no source of embolus  LDL 100  HgbA1c - 7.8  Lovenox 40 mg sq daily for VTE prophylaxis  Diet Carb Modified Fluid consistency: Thin; Room service appropriate? Yes. Speech therapist is at bedside, reevaluating swallow. Passed initial stroke swallow screen, but made nothing by mouth after neurologic worsening, this a.m.  No antithrombotic prior to admission, now on aspirin 325 mg daily  Patient counseled to be compliant with her  antithrombotic medications  Ongoing aggressive stroke risk factor management  Therapy recommendations:  Skilled nursing facility placement recommended  Disposition:  pending   Hypertensive Urgency  Blood pressure 211/110 on presentation in setting of neurologic symptoms  Treated with labetalol en route to [facility]  Progressive elevation in setting of neurologic symptoms today, up to 222/90  Treat with labetolol prn  Add Norvasc 5 mg by mouth  SBP goal < 200  Long-term BP goal normotensive  Hyperlipidemia  Home meds:  No statin  LDL 100, goal < 70  New Zocor 20 mg daily added  Continue statin at discharge  Diabetes  HgbA1c 7.8, goal < 7.0  Uncontrolled  Other Stroke Risk Factors  Advanced age  Other Active Problems  Bradycardia, heart rate 50-66  Left supraclinoid ICA 1.5 mm aneurysm.  PLAN  Workup complete.  The stroke team will sign off at this time. Please call if we can be of further service.  Follow-up with Dr. Pearlean Brownie in 2 months.  Hospital day # 1  Acute small right BG infarct with slowly improving left arm and leg weakness.  BP is improving.  CTA Neck is normal.  TTE was normal.  MRA Brain shows right PCA occlusion, but related to prior right occipital infarct.  Tele shows NSR so far.  Continue ASA 81 mg qd for secondary stroke prevention and modify BP further.    Weston Settle, MD 11/06/2015 4:15 PM    To contact Stroke Continuity provider, please refer to [email or web address]. After hours, contact General Neurology

## 2015-11-06 NOTE — Evaluation (Signed)
Physical Therapy Evaluation Patient Details Name: Margaret Mcintosh MRN: [id number] DOB: 1932/06/05 Today's Date: 11/06/2015   History of Present Illness  Pt was admitted with stroke-like symptoms, L sided weakness.  H/O HTN and DM  Clinical Impression  Patient presents with decreased mobility due to deficits listed in PT problem list.  She will benefit from skilled PT in the acute setting to allow return home following SNF level rehab stay.   Follow Up Recommendations SNF;Supervision/Assistance - 24 hour    Equipment Recommendations  Other (comment) (TBA)    Recommendations for Other Services       Precautions / Restrictions Precautions Precautions: Fall Restrictions Weight Bearing Restrictions: No      Mobility  Bed Mobility Overal bed mobility: Needs Assistance Bed Mobility: Rolling;Sidelying to Sit Rolling: Min assist Sidelying to sit: Mod assist       General bed mobility comments: A for Bilat LE's and trunk  Transfers Overall transfer level: Needs assistance Equipment used: None;Rolling walker (2 wheeled) Transfers: Sit to/from [employer] Sit to Stand: Mod assist Stand pivot transfers: Mod assist       General transfer comment: assist up from bed to [facility] with pivot tranfer no device, then up to stand to walker to take steps  Ambulation/Gait Ambulation/Gait assistance: Mod assist Ambulation Distance (Feet): 4 Feet Assistive device: Rolling walker (2 wheeled) Gait Pattern/deviations: Step-to pattern;Shuffle     General Gait Details: assist for L hand on walker, sliding L foot on floor, but able to take weight for stepping with R  Stairs            Wheelchair Mobility    Modified Rankin (Stroke Patients Only) Modified Rankin (Stroke Patients Only) Pre-Morbid Rankin Score: No symptoms Modified Rankin: Moderately severe disability     Balance Overall balance assessment: Needs assistance Sitting-balance support: Feet  supported Sitting balance-Leahy Scale: Fair     Standing balance support: Bilateral upper extremity supported Standing balance-Leahy Scale: Poor Standing balance comment: UE support and min A for balance while standing for hygiene after toileting                             Pertinent Vitals/Pain Pain Assessment: Faces Faces Pain Scale: Hurts even more Pain Location: R hip with bending leg in bed and until coming up to sit Pain Descriptors / Indicators: Aching Pain Intervention(s): Repositioned;Monitored during session    Home Living Family/patient expects to be discharged to:: Skilled nursing facility Living Arrangements: Alone             Home Equipment: Cane - single point Additional Comments: per chart, works as Multimedia programmer Level of Independence: Independent         Comments: occasional cane use due to R hip problems premorbid     Hand Dominance   Dominant Hand: Right    Extremity/Trunk Assessment                         Communication   Communication: No difficulties  Cognition Arousal/Alertness: Awake/alert Behavior During Therapy: WFL for tasks assessed/performed Overall Cognitive Status: Within Functional Limits for tasks assessed                      General Comments General comments (skin integrity, edema, etc.): Son in room and with questions regarding coverage and rehab.     Exercises  Assessment/Plan    PT Assessment Patient needs continued PT services  PT Diagnosis Abnormality of gait;Hemiplegia non-dominant side   PT Problem List Decreased strength;Decreased balance;Decreased mobility;Decreased activity tolerance;Decreased knowledge of use of DME;Impaired tone;Decreased knowledge of precautions  PT Treatment Interventions DME instruction;Therapeutic activities;Gait training;Functional mobility training;Balance training;Therapeutic exercise;Patient/family education   PT Goals (Current goals  can be found in the Care Plan section) Acute Rehab PT Goals Patient Stated Goal: get better PT Goal Formulation: With patient/family Time For Goal Achievement: 11/13/15 Potential to Achieve Goals: Good    Frequency Min 4X/week   Barriers to discharge        Co-evaluation               End of Session Equipment Utilized During Treatment: Gait belt Activity Tolerance: Patient limited by fatigue (and nausea) Patient left: with call bell/phone within reach;in bed Nurse Communication: Mobility status;Other (comment) (nausea)         Time: [id number] PT Time Calculation (min) (ACUTE ONLY): 35 min   Charges:   PT Evaluation $PT Eval Moderate Complexity: 1 Procedure PT Treatments $Therapeutic Activity: 8-22 mins   PT G Codes:        Elray McgregorCynthia Brecken Walth 11/06/2015, 12:38 PM  Sheran Lawlessyndi Katey Barrie, PT [phone] 11/06/2015

## 2015-11-06 NOTE — Progress Notes (Signed)
PROGRESS NOTE    Sheran LawlessShelvia S Poss  WUJ:[id number] DOB: 1932/07/21 DOA: 11/04/2015 PCP: Delton Prairieobin, Paul, MD     Assessment & Plan:   Principal Problem:   Stroke-like symptoms Active Problems:   TIA (transient ischemic attack)   Diabetes (HCC)   Bradycardia   Hypertension   Stroke-like symptom  #1 acute right basal ganglia infarct in the setting of subacute right occipital and pontine infarcts Likely secondary to small vessel disease. Patient with some progressive neurological worsening yesterday morning(11/05/2015) with left facial tingling and numbness as well as worsening left-sided weakness. Patient given a bolus of normal saline 500 mL 1 in order to lay flat in bed. Symptoms improved. Repeat head CT with no hemorrhagic conversion no evidence of infarct progression. Remote right a single pole infarct. Right subcortical infarcts on previous brain MRI a subtle by CT. Patient with clinical improvement.  MRI head with small acute right basal ganglia infarcts. Subacute/chronic right of center point in infarcts. MRA would right PCA occlusion. No major branch of flow-limiting. Left super clean urine ICA 1.5 mm aneurysm. CT angiogram of head and neck with right PCA occlusion. Thrombosis versus irregular plaque proximal left ICA. Cervical carotid atherosclerosis right 20% left 50%. Old right occipital infarct. 2-D echo which was done with a EF of 55-60% with no source of emboli. Fasting lipid panel with LDL of 100. Continue Zocor. Hemoglobin A1c 7.8.Continue aspirin 325 mg daily for secondary stroke prevention. Risk factor modification.  #2 hypertensive urgency On presentation patient noted to have a blood pressure of 211/110 with neurological symptoms. Patient given lip when necessary labetalol. With worsening neurological symptoms the morning of 11/05/2015, patient's blood pressure went up to systolic of 222. Patient has been started on Norvasc 5 mg daily and labetalol. Keep systolic blood pressure  less than 200. May need to increase Norvasc to 10 mg daily. Follow for now.  #3 hyperlipidemia LDL of 100. Goal LDL 70. Patient's been started on Zocor.  #4 diabetes mellitus Hemoglobin A1c 7.8. CBGs have ranged from 158 -184.  #5 left groin pain Likely musculoskeletal in nature. Pain improving with scheduled ibuprofen.   #6 bradycardia On admission heart rate in the 50s to 60s patient had received labetalol on route. Monitor closely. with when necessary labetalol.    DVT prophylaxis: Lovenox Code Status: Full Family Communication: Updated patient and son and family at bedside. Disposition Plan: Pending stroke workup. Home versus SNF.   Consultants:   Neurology: Dr. Hilda BladesArmstrong 11/04/2015   Procedures:   2-D echo 11/05/2015  CT head 11/05/2015, 11/04/2015   CT angiogram head and neck 11/04/2015  MRI head MRA head 11/04/2015  Antimicrobials:   None   Subjective: Patient states left groin pain with movement of left lower extremity improving. No chest pain. No shortness of breath. Patient states tingling and numbness on face and left sided weakness improving.   Objective: Vitals:   11/06/15 0905 11/06/15 1226 11/06/15 1327 11/06/15 1656  BP: (!) 189/70 (!) 153/69 (!) 149/48 (!) 146/62  Pulse: 67 70 78 67  Resp: 18  18 18   Temp: 97.7 F (36.5 C)  98 F (36.7 C) 98 F (36.7 C)  TempSrc: Oral  Oral Oral  SpO2: 94% 93% 97% 95%  Weight:      Height:       No intake or output data in the 24 hours ending 11/06/15 1822 Filed Weights   11/05/15 1530  Weight: 83.9 kg (185 lb)    Examination:  General exam: Appears  calm and comfortable.Laying flat  Respiratory system: Clear to auscultation anterior lung fields. Respiratory effort normal. Cardiovascular system: S1 & S2 heard, RRR. No JVD, murmurs, rubs, gallops or clicks. No pedal edema. Gastrointestinal system: Abdomen is nondistended, soft and nontender. No organomegaly or masses felt. Normal bowel sounds  heard. Central nervous system: Alert and oriented. No focal neurological deficits. Extremities: Symmetric 5 x 5 power. Skin: No rashes, lesions or ulcers Psychiatry: Judgement and insight appear normal. Mood & affect appropriate.     Data Reviewed: I have personally reviewed following labs and imaging studies  CBC:  Recent Labs Lab 11/04/15 0839 11/04/15 1336 11/05/15 0938  WBC 6.2  --  5.6  NEUTROABS 4.0  --  3.7  HGB 15.4 15.3* 14.0  HCT 44.9 45.0 44.1  MCV 87.9  --  92.3  PLT 165  --  183   Basic Metabolic Panel:  Recent Labs Lab 11/04/15 0912 11/04/15 1336 11/05/15 0938 11/06/15 0501  NA 139 140 137 137  K 5.0 3.9 4.0 4.0  CL 103 102 103 103  CO2 30  --  26 23  GLUCOSE 199* 191* 153* 145*  BUN 13 10 8 6   CREATININE 0.85 0.70 0.83 0.61  CALCIUM 9.8  --  9.3 9.0  MG  --   --  2.1  --    GFR: Estimated Creatinine Clearance: 60 mL/min (by C-G formula based on SCr of 0.8 mg/dL). Liver Function Tests:  Recent Labs Lab 11/04/15 0912  AST 20  ALT 15  ALKPHOS 72  BILITOT 0.9  PROT 7.9  ALBUMIN 4.3   No results for input(s): LIPASE, AMYLASE in the last 168 hours. No results for input(s): AMMONIA in the last 168 hours. Coagulation Profile:  Recent Labs Lab 11/04/15 1317  INR 1.01   Cardiac Enzymes: No results for input(s): CKTOTAL, CKMB, CKMBINDEX, TROPONINI in the last 168 hours. BNP (last 3 results) No results for input(s): PROBNP in the last 8760 hours. HbA1C:  Recent Labs  11/05/15 0549  HGBA1C 7.8*   CBG:  Recent Labs Lab 11/05/15 1636 11/05/15 2105 11/06/15 0616 11/06/15 1127 11/06/15 1650  GLUCAP 171* 175* 162* 158* 184*   Lipid Profile:  Recent Labs  11/05/15 0549  CHOL 169  HDL 46  LDLCALC 100*  TRIG 117  CHOLHDL 3.7   Thyroid Function Tests: No results for input(s): TSH, T4TOTAL, FREET4, T3FREE, THYROIDAB in the last 72 hours. Anemia Panel: No results for input(s): VITAMINB12, FOLATE, FERRITIN, TIBC, IRON,  RETICCTPCT in the last 72 hours. Sepsis Labs: No results for input(s): PROCALCITON, LATICACIDVEN in the last 168 hours.  No results found for this or any previous visit (from the past 240 hour(s)).       Radiology Studies: Ct Head Wo Contrast  Result Date: 11/05/2015 CLINICAL DATA:  Recent stroke. Sudden onset worsening symptoms including left arm weakness. EXAM: CT HEAD WITHOUT CONTRAST TECHNIQUE: Contiguous axial images were obtained from the base of the skull through the vertex without intravenous contrast. COMPARISON:  Head CT and brain MRI from yesterday FINDINGS: Brain: Right corona radiata and caudate body infarcts by MRI are subtle by CT. No evidence of infarct extension or cortical involvement. No hemorrhagic conversion. Remote right PCA territory infarct affecting the occipital pole. No hydrocephalus or shift. Vascular: No hyperdense vessel .  Atherosclerotic calcification Skull: Negative for fracture or focal lesion. Sinuses/Orbits: No acute finding. Other: None. IMPRESSION: 1. Right subcortical infarcts on previous brain MRI are subtle by CT. No hemorrhagic conversion or  evidence of infarct progression. 2. Remote right occipital pole infarct. Electronically Signed   By: Marnee Spring M.D.   On: 11/05/2015 13:45   Mr Brain Wo Contrast  Result Date: 11/04/2015 CLINICAL DATA:  Numbness in the left face and left upper extremity. Left leg weakness. Symptoms have improved. Hypertensive. EXAM: MRI HEAD WITHOUT CONTRAST MRA HEAD WITHOUT CONTRAST TECHNIQUE: Multiplanar, multiecho pulse sequences of the brain and surrounding structures were obtained without intravenous contrast. Angiographic images of the head were obtained using MRA technique without contrast. COMPARISON:  Head CT and CTA earlier today FINDINGS: MRI HEAD FINDINGS There are small acute right lateral lenticulostriate territory infarcts involving the posterior lentiform nucleus, corona radiata, and caudate body. There is a chronic  infarct in the right central pons with associated chronic blood products resulting and mild artifact on diffusion imaging. There is a small chronic right occipital cortical infarct with associated chronic blood products. No mass, midline shift, or extra-axial fluid collection is seen. Cerebral atrophy is within normal limits for age. Small foci of T2 hyperintensity scattered in the cerebral white matter are nonspecific but compatible with minimal chronic small vessel ischemic disease. A dilated perivascular space is noted inferiorly in the right basal ganglia. Orbits are unremarkable. Paranasal sinuses and mastoid air cells are clear. Major intracranial vascular flow voids are preserved. No focal osseous lesion is identified. MRA HEAD FINDINGS The visualized distal vertebral arteries are patent to the basilar with the left being dominant. PICA origins, left AICA origin, and SCA origins are patent. A right AICA is not clearly identified. The basilar artery is widely patent. The left PCA is patent without evidence of significant proximal stenosis. A severe right P1 stenosis is again seen with occlusion of the right PCA near the P1 - P2 junction. The internal carotid arteries are patent from skullbase to carotid termini. There is atherosclerotic carotid siphon irregularity bilaterally. There is mild right supraclinoid ICA stenosis. There is a 1.5 mm medial outpouching from the proximal left supraclinoid ICA in the superior hypophyseal region compatible with a tiny aneurysm. ACAs and MCAs are patent without evidence of major branch occlusion or significant proximal stenosis. IMPRESSION: 1. Small acute right basal ganglia region infarcts. 2. Chronic infarcts in the right occipital lobe and pons. 3. Proximal right PCA occlusion as described on earlier CTA. 4. No major branch occlusion or flow limiting proximal stenosis in the anterior circulation. Mild right supraclinoid ICA stenosis. 5. 1.5 mm left supraclinoid ICA  aneurysm. Electronically Signed   By: Sebastian Ache M.D.   On: 11/04/2015 21:16   Mr Maxine Glenn Head/brain UV Cm  Result Date: 11/04/2015 CLINICAL DATA:  Numbness in the left face and left upper extremity. Left leg weakness. Symptoms have improved. Hypertensive. EXAM: MRI HEAD WITHOUT CONTRAST MRA HEAD WITHOUT CONTRAST TECHNIQUE: Multiplanar, multiecho pulse sequences of the brain and surrounding structures were obtained without intravenous contrast. Angiographic images of the head were obtained using MRA technique without contrast. COMPARISON:  Head CT and CTA earlier today FINDINGS: MRI HEAD FINDINGS There are small acute right lateral lenticulostriate territory infarcts involving the posterior lentiform nucleus, corona radiata, and caudate body. There is a chronic infarct in the right central pons with associated chronic blood products resulting and mild artifact on diffusion imaging. There is a small chronic right occipital cortical infarct with associated chronic blood products. No mass, midline shift, or extra-axial fluid collection is seen. Cerebral atrophy is within normal limits for age. Small foci of T2 hyperintensity scattered in the cerebral  white matter are nonspecific but compatible with minimal chronic small vessel ischemic disease. A dilated perivascular space is noted inferiorly in the right basal ganglia. Orbits are unremarkable. Paranasal sinuses and mastoid air cells are clear. Major intracranial vascular flow voids are preserved. No focal osseous lesion is identified. MRA HEAD FINDINGS The visualized distal vertebral arteries are patent to the basilar with the left being dominant. PICA origins, left AICA origin, and SCA origins are patent. A right AICA is not clearly identified. The basilar artery is widely patent. The left PCA is patent without evidence of significant proximal stenosis. A severe right P1 stenosis is again seen with occlusion of the right PCA near the P1 - P2 junction. The internal  carotid arteries are patent from skullbase to carotid termini. There is atherosclerotic carotid siphon irregularity bilaterally. There is mild right supraclinoid ICA stenosis. There is a 1.5 mm medial outpouching from the proximal left supraclinoid ICA in the superior hypophyseal region compatible with a tiny aneurysm. ACAs and MCAs are patent without evidence of major branch occlusion or significant proximal stenosis. IMPRESSION: 1. Small acute right basal ganglia region infarcts. 2. Chronic infarcts in the right occipital lobe and pons. 3. Proximal right PCA occlusion as described on earlier CTA. 4. No major branch occlusion or flow limiting proximal stenosis in the anterior circulation. Mild right supraclinoid ICA stenosis. 5. 1.5 mm left supraclinoid ICA aneurysm. Electronically Signed   By: Sebastian Ache M.D.   On: 11/04/2015 21:16        Scheduled Meds: . amLODipine  5 mg Oral Daily  . aspirin  300 mg Rectal Daily   Or  . aspirin EC  325 mg Oral Daily  . enoxaparin (LOVENOX) injection  40 mg Subcutaneous Q24H  . ibuprofen  400 mg Oral TID  . insulin aspart  0-15 Units Subcutaneous TID WC  . insulin aspart  0-5 Units Subcutaneous QHS  . labetalol  100 mg Oral TID  . nystatin   Topical BID  . pantoprazole  40 mg Oral Q0600  . simvastatin  20 mg Oral q1800   Continuous Infusions: . sodium chloride 70 mL/hr at 11/06/15 0918     LOS: 1 day    Time spent: 40 minutes    Mandrell Vangilder, MD [facility] Pager [phone]  If 7PM-7AM, please contact night-coverage [email or web address] Password [facility] 11/06/2015, 6:22 PM

## 2015-11-07 DIAGNOSIS — I6309 Cerebral infarction due to thrombosis of other precerebral artery: Secondary | ICD-10-CM

## 2015-11-07 LAB — GLUCOSE, CAPILLARY
Glucose-Capillary: 145 mg/dL — ABNORMAL HIGH (ref 65–99)
Glucose-Capillary: 146 mg/dL — ABNORMAL HIGH (ref 65–99)
Glucose-Capillary: 144 mg/dL — ABNORMAL HIGH (ref 65–99)
Glucose-Capillary: 121 mg/dL — ABNORMAL HIGH (ref 65–99)

## 2015-11-07 NOTE — Progress Notes (Deleted)
Pt's husband gave her chewing gum now pt will not spit it out, I attempted to educate husband on dangers of giving her chewing gum, I do not fell that he fully understood the gravity of this.

## 2015-11-07 NOTE — Progress Notes (Signed)
PROGRESS NOTE    Margaret Mcintosh  ZOX:[id number] DOB: 1932-07-31 DOA: 11/04/2015 PCP: Delton Prairie, MD     Assessment & Plan:   Principal Problem:   Stroke-like symptoms Active Problems:   TIA (transient ischemic attack)   Diabetes (HCC)   Bradycardia   Hypertension   Stroke-like symptom   Diabetes mellitus with HbA1C goal between 7 and 8   Groin pain   Hypertensive urgency  #1 acute right basal ganglia infarct in the setting of subacute right occipital and pontine infarcts Likely secondary to small vessel disease. Patient with some progressive neurological worsening yesterday morning(11/05/2015) with left facial tingling and numbness as well as worsening left-sided weakness. Patient given a bolus of normal saline 500 mL 1 in order to lay flat in bed. Symptoms improved. Repeat head CT with no hemorrhagic conversion no evidence of infarct progression. Remote right a single pole infarct. Right subcortical infarcts on previous brain MRI a subtle by CT. Patient with clinical improvement.  MRI head with small acute right basal ganglia infarcts. Subacute/chronic right of center point in infarcts. MRA would right PCA occlusion. No major branch of flow-limiting. Left super clean urine ICA 1.5 mm aneurysm. CT angiogram of head and neck with right PCA occlusion. Thrombosis versus irregular plaque proximal left ICA. Cervical carotid atherosclerosis right 20% left 50%. Old right occipital infarct. 2-D echo which was done with a EF of 55-60% with no source of emboli. Fasting lipid panel with LDL of 100. Continue Zocor. Hemoglobin A1c 7.8.Continue aspirin 325 mg daily for secondary stroke prevention. Risk factor modification.  #2 hypertensive urgency On presentation patient noted to have a blood pressure of 211/110 with neurological symptoms. Patient given lip when necessary labetalol. With worsening neurological symptoms the morning of 11/05/2015, patient's blood pressure went up to systolic of 222.  Patient has been started on Norvasc 5 mg daily and labetalol. Keep systolic blood pressure less than 200. May need to increase Norvasc to 10 mg daily. Follow for now.  #3 hyperlipidemia LDL of 100. Goal LDL 70. Patient's been started on Zocor.  #4 diabetes mellitus Hemoglobin A1c 7.8. CBGs have ranged from 145 -148.  #5 left groin pain Likely musculoskeletal in nature. Pain improving with scheduled ibuprofen.   #6 bradycardia On admission heart rate in the 50s to 60s patient had received labetalol on route. Monitor closely.    DVT prophylaxis: Lovenox Code Status: Full Family Communication: Updated patient. No family at bedside. Disposition Plan: Skilled nursing facility hopefully tomorrow.    Consultants:   Neurology: Dr. Hilda Blades 11/04/2015   Procedures:   2-D echo 11/05/2015  CT head 11/05/2015, 11/04/2015   CT angiogram head and neck 11/04/2015  MRI head MRA head 11/04/2015  Antimicrobials:   None   Subjective: Patient states left groin pain with movement of left lower extremity improving. No chest pain. No shortness of breath. Patient states tingling and numbness on face and left sided weakness slowly improving. Patient sitting up in chair.  Objective: Vitals:   11/06/15 2149 11/07/15 0209 11/07/15 0547 11/07/15 1008  BP: (!) 130/43 (!) 132/48 (!) 146/48 (!) 163/54  Pulse: 65 78 62 (!) 58  Resp: 18 18 18 18   Temp: 98.5 F (36.9 C) 98.6 F (37 C) 97.8 F (36.6 C) 98 F (36.7 C)  TempSrc: Oral Oral Oral Oral  SpO2: 93% 96% 94% 97%  Weight:      Height:        Intake/Output Summary (Last 24 hours) at 11/07/15 1139 Last  data filed at 11/06/15 1800  Gross per 24 hour  Intake              560 ml  Output                0 ml  Net              560 ml   Filed Weights   11/05/15 1530  Weight: 83.9 kg (185 lb)    Examination:  General exam: Appears calm and comfortable. Respiratory system: Clear to auscultation anterior lung fields. Respiratory  effort normal. Cardiovascular system: S1 & S2 heard, RRR. No JVD, murmurs, rubs, gallops or clicks. No pedal edema. Gastrointestinal system: Abdomen is nondistended, soft and nontender. No organomegaly or masses felt. Normal bowel sounds heard. Central nervous system: Alert and oriented. No focal neurological deficits. Extremities: Symmetric 5 x 5 power. Skin: No rashes, lesions or ulcers Psychiatry: Judgement and insight appear normal. Mood & affect appropriate.     Data Reviewed: I have personally reviewed following labs and imaging studies  CBC:  Recent Labs Lab 11/04/15 0839 11/04/15 1336 11/05/15 0938  WBC 6.2  --  5.6  NEUTROABS 4.0  --  3.7  HGB 15.4 15.3* 14.0  HCT 44.9 45.0 44.1  MCV 87.9  --  92.3  PLT 165  --  183   Basic Metabolic Panel:  Recent Labs Lab 11/04/15 0912 11/04/15 1336 11/05/15 0938 11/06/15 0501  NA 139 140 137 137  K 5.0 3.9 4.0 4.0  CL 103 102 103 103  CO2 30  --  26 23  GLUCOSE 199* 191* 153* 145*  BUN CREATININE 0.85 0.70 0.83 0.61  CALCIUM 9.8  --  9.3 9.0  MG  --   --  2.1  --    GFR: Estimated Creatinine Clearance: 60 mL/min (by C-G formula based on SCr of 0.8 mg/dL). Liver Function Tests:  Recent Labs Lab 11/04/15 0912  AST 20  ALT 15  ALKPHOS 72  BILITOT 0.9  PROT 7.9  ALBUMIN 4.3   No results for input(s): LIPASE, AMYLASE in the last 168 hours. No results for input(s): AMMONIA in the last 168 hours. Coagulation Profile:  Recent Labs Lab 11/04/15 1317  INR 1.01   Cardiac Enzymes: No results for input(s): CKTOTAL, CKMB, CKMBINDEX, TROPONINI in the last 168 hours. BNP (last 3 results) No results for input(s): PROBNP in the last 8760 hours. HbA1C:  Recent Labs  11/05/15 0549  HGBA1C 7.8*   CBG:  Recent Labs Lab 11/06/15 0616 11/06/15 1127 11/06/15 1650 11/06/15 2153 11/07/15 0547  GLUCAP 162* 158* 184* 148* 145*   Lipid Profile:  Recent Labs  11/05/15 0549  CHOL 169  HDL 46    LDLCALC 100*  TRIG 117  CHOLHDL 3.7   Thyroid Function Tests: No results for input(s): TSH, T4TOTAL, FREET4, T3FREE, THYROIDAB in the last 72 hours. Anemia Panel: No results for input(s): VITAMINB12, FOLATE, FERRITIN, TIBC, IRON, RETICCTPCT in the last 72 hours. Sepsis Labs: No results for input(s): PROCALCITON, LATICACIDVEN in the last 168 hours.  No results found for this or any previous visit (from the past 240 hour(s)).       Radiology Studies: Ct Head Wo Contrast  Result Date: 11/05/2015 CLINICAL DATA:  Recent stroke. Sudden onset worsening symptoms including left arm weakness. EXAM: CT HEAD WITHOUT CONTRAST TECHNIQUE: Contiguous axial images were obtained from the base of the skull through the vertex without intravenous contrast. COMPARISON:  Head CT and brain MRI from yesterday FINDINGS: Brain: Right corona radiata and caudate body infarcts by MRI are subtle by CT. No evidence of infarct extension or cortical involvement. No hemorrhagic conversion. Remote right PCA territory infarct affecting the occipital pole. No hydrocephalus or shift. Vascular: No hyperdense vessel .  Atherosclerotic calcification Skull: Negative for fracture or focal lesion. Sinuses/Orbits: No acute finding. Other: None. IMPRESSION: 1. Right subcortical infarcts on previous brain MRI are subtle by CT. No hemorrhagic conversion or evidence of infarct progression. 2. Remote right occipital pole infarct. Electronically Signed   By: Marnee SpringJonathon  Watts M.D.   On: 11/05/2015 13:45        Scheduled Meds: . amLODipine  5 mg Oral Daily  . aspirin  300 mg Rectal Daily   Or  . aspirin EC  325 mg Oral Daily  . enoxaparin (LOVENOX) injection  40 mg Subcutaneous Q24H  . ibuprofen  400 mg Oral TID  . insulin aspart  0-15 Units Subcutaneous TID WC  . insulin aspart  0-5 Units Subcutaneous QHS  . labetalol  100 mg Oral TID  . nystatin   Topical BID  . pantoprazole  40 mg Oral Q0600  . simvastatin  20 mg Oral q1800    Continuous Infusions: . sodium chloride Stopped (11/06/15 1903)     LOS: 2 days    Time spent: 40 minutes    THOMPSON,DANIEL, MD [facility] Pager [phone]  If 7PM-7AM, please contact night-coverage [email or web address] Password [email or web address] 11/07/2015, 11:39 AM

## 2015-11-08 ENCOUNTER — Encounter
Admission: RE | Admit: 2015-11-08 | Discharge: 2015-11-08 | Disposition: A | Discharge status: Home / Self Care | Payer: Medicare Other | Source: Ambulatory Visit | Attending: Internal Medicine | Admitting: Internal Medicine

## 2015-11-08 LAB — BASIC METABOLIC PANEL
Anion gap: 8 (ref 5–15)
BUN: 11 mg/dL (ref 6–20)
CALCIUM: 9.3 mg/dL (ref 8.9–10.3)
CHLORIDE: 104 mmol/L (ref 101–111)
CO2: 27 mmol/L (ref 22–32)
CREATININE: 0.86 mg/dL (ref 0.44–1.00)
GFR calc Af Amer: >60 mL/min (ref >60)
GFR calc non Af Amer: >60 mL/min (ref >60)
Glucose, Bld: 137 mg/dL — ABNORMAL HIGH (ref 65–99)
Potassium: 3.8 mmol/L (ref 3.5–5.1)
SODIUM: 139 mmol/L (ref 135–145)

## 2015-11-08 LAB — GLUCOSE, CAPILLARY
Glucose-Capillary: 141 mg/dL — ABNORMAL HIGH (ref 65–99)
Glucose-Capillary: 177 mg/dL — ABNORMAL HIGH (ref 65–99)
Glucose-Capillary: 189 mg/dL — ABNORMAL HIGH (ref 65–99)

## 2015-11-08 MED ORDER — ASPIRIN 325 MG PO TBEC: 325.0000 mg | DELAYED_RELEASE_TABLET | Freq: Every day | ORAL | 0 refills | Status: DC

## 2015-11-08 MED ORDER — AMLODIPINE BESYLATE 5 MG PO TABS
5.0000 mg | ORAL_TABLET | Freq: Every day | ORAL | 0 refills | Status: DC
Start: 2015-11-08 — End: 2017-05-31

## 2015-11-08 MED ORDER — LABETALOL HCL 100 MG PO TABS: 100.0000 mg | ORAL_TABLET | Freq: Three times a day (TID) | ORAL | 0 refills | Status: DC

## 2015-11-08 MED ORDER — SIMVASTATIN 20 MG PO TABS: 20.0000 mg | ORAL_TABLET | Freq: Every day | ORAL | 0 refills | Status: DC

## 2015-11-08 MED ORDER — PANTOPRAZOLE SODIUM 40 MG PO TBEC: 40.0000 mg | DELAYED_RELEASE_TABLET | Freq: Every day | ORAL | 0 refills | Status: DC

## 2015-11-08 MED ORDER — NYSTATIN 100000 UNIT/GM EX POWD: Freq: Two times a day (BID) | CUTANEOUS | 0 refills | Status: DC

## 2015-11-08 MED ORDER — AMLODIPINE BESYLATE 10 MG PO TABS
10.0000 mg | ORAL_TABLET | Freq: Every day | ORAL | Status: DC
Start: 2015-11-08 — End: 2015-11-08

## 2015-11-08 MED ORDER — AMLODIPINE BESYLATE 5 MG PO TABS
5.0000 mg | ORAL_TABLET | Freq: Every day | ORAL | Status: DC
  Administered 2015-11-08: 5 mg via ORAL
  Filled 2015-11-08: qty 1

## 2015-11-08 NOTE — Progress Notes (Signed)
[facility] Family aware of disposition. All belongings packed and ready for transport. Awaiting for [facility].   Sim BoastHavy, RN

## 2015-11-08 NOTE — Discharge Summary (Signed)
Physician Discharge Summary  Margaret Mcintosh ZOX:[id number] DOB: 1933/01/11 DOA: 11/04/2015  PCP: Margaret [place], MD  Admit date: 11/04/2015 Discharge date: 11/08/2015  Time spent: 65 minutes  Recommendations for Outpatient Follow-up:  1. Patient will be discharged to a skilled nursing facility. Patient will follow-up with M.D. at skilled nursing facility. Patient's blood pressure needs to be assessed with further titration of blood pressure medications for better blood pressure control. 2. Follow-up with Margaret. Pearlean Mcintosh in 2 months.   Discharge Diagnoses:  Principal Problem:   Stroke-like symptoms Active Problems:   TIA (transient ischemic attack)   Diabetes (HCC)   Bradycardia   Hypertension   Stroke-like symptom   Diabetes mellitus with HbA1C goal between 7 and 8   Groin pain   Hypertensive urgency   Discharge Condition: Stable  Diet recommendation: Heart healthy.  Filed Weights   11/05/15 1530  Weight: 83.9 kg (185 lb)    History of present illness:  Per Margaret Mcintosh is a very pleasnat 82 y.o. female with medical history significant hypertension, diabetes, presented to the emergency Department chief complaint left-sided weakness/gait disturbance. She was transported here from [facility] where initial evaluation concerning for stroke/TIA.  Information was obtained from the patient and the chart. She reported being in her usual state of health last night around 11 PM when she went to bed. She awakened around 4:30 this morning to go to the restroom and noticed she was "off balance" and veering off to the left. She reported she "staggered" back to bed and awakened around 7 AM. She began to take a shower and noted she remained off balance and also noted that her left arm was quite weak. She reported having difficulty cleaning her ear is impression of her teeth. Associated symptoms included left leg "heaviness". She denied headache visual disturbances numbness  tingling of extremities. She denied any difficulty chewing or swallowing. She denied chest pain palpitation shortness of breath diaphoresis nausea vomiting. She denied any dysuria hematuria frequency or urgency. She denied any recent travel fever chills or sick contacts. She reported her arm continues to feel a little "heavy" but otherwise she felt like symptoms had resolved.  At [facility] code stroke protocol initiated with head CT showing no evidence of hemorrhagic stroke. CT angiogram of the head showed what appeared to be a stricture or obstruction at the posterior communicating artery. Seemed to be some confusion whether this was acute or chronic per notes. Case discussed with interventional radiology at [facility] as well as Margaret Mcintosh at time he was decided to [facility]. It was noted she is not a TPA candidate. By the time she arrived at [facility] her symptoms much improved  Margaret Mcintosh Course: She was afebrile hemodynamically stable and not hypoxic.   Hospital Course:  #1 acute right basal ganglia infarct in the setting of subacute right occipital and pontine infarcts Likely secondary to small vessel disease. Patient with some progressive neurological worsening the morning of 11/05/2015 with left facial tingling and numbness as well as worsening left-sided weakness. Patient given a bolus of normal saline 500 mL 1 and ordered to lay flat in bed. Symptoms improved. Repeat head CT with no hemorrhagic conversion no evidence of infarct progression. Remote right a single pole infarct. Right subcortical infarcts on previous brain MRI a subtle by CT. Patient with clinical improvement.  MRI head with small acute right basal ganglia infarcts. Subacute/chronic right of center point in infarcts. MRA would right PCA occlusion.  No major branch of flow-limiting. Left super clean urine ICA 1.5 mm aneurysm. CT angiogram of head and neck with right PCA occlusion. Thrombosis versus irregular plaque proximal left  ICA. Cervical carotid atherosclerosis right 20% left 50%. Old right occipital infarct. 2-D echo which was done with a EF of 55-60% with no source of emboli. Fasting lipid panel with LDL of 100. Patient placed on Zocor. Hemoglobin A1c 7.8. Patient was maintained on aspirin 325 mg daily for secondary stroke prevention. Continue aspirin 325 mg daily for secondary stroke prevention.   #2 hypertensive urgency On presentation patient noted to have a blood pressure of 211/110 with neurological symptoms. Patient given when necessary labetalol. With worsening neurological symptoms the morning of 11/05/2015, patient's blood pressure went up to systolic of 222. Patient was started on Margaret Mcintosh 5 mg daily and labetalol. Patient's blood pressure improved and patient be discharged on labetalol and Margaret Mcintosh with outpatient follow-up and further titration.  #3 hyperlipidemia LDL of 100. Goal LDL 70. Patient was started on a statin. Outpatient follow-up.   #4 diabetes mellitus Hemoglobin A1c 7.8. Patient was maintained on sliding scale insulin.   #5 left groin pain Likely musculoskeletal in nature. Patient was placed on scheduled ibuprofen with improvement with groin pain. Outpatient follow-up.   #6 bradycardia On admission heart rate in the 50s to 60s patient had received labetalol on route. Patient's heart rate remained stable. Patient was maintained on labetalol 100 mg 3 times daily for better blood pressure control and did not have any significant bradycardic issues.     Procedures:  2-D echo 11/05/2015  CT head 11/05/2015, 11/04/2015   CT angiogram head and neck 11/04/2015  MRI head MRA head 11/04/2015  Consultations:  Neurology: Margaret Mcintosh 11/04/2015   Discharge Exam: Vitals:   11/08/15 1028 11/08/15 1435  BP: (!) 129/53 (!) 142/67  Pulse: 66 66  Resp: 18 18  Temp: 98.6 F (37 C) 98 F (36.7 C)    General: NAD Cardiovascular: RRR Respiratory: CTAB  Discharge  Instructions   Discharge Instructions    Ambulatory referral to Neurology    Complete by:  As directed   An appointment is requested for Margaret Pearlean BrownieSethi in approximately: 2 months   Diet - low sodium heart healthy    Complete by:  As directed   Increase activity slowly    Complete by:  As directed     Current Discharge Medication List    START taking these medications   Details  amLODipine (Margaret Mcintosh) 5 MG tablet Take 1 tablet (5 mg total) by mouth daily. Qty: 30 tablet, Refills: 0    aspirin EC 325 MG EC tablet Take 1 tablet (325 mg total) by mouth daily. Qty: 30 tablet, Refills: 0    labetalol (NORMODYNE) 100 MG tablet Take 1 tablet (100 mg total) by mouth 3 (three) times daily. Qty: 90 tablet, Refills: 0    nystatin (MYCOSTATIN/NYSTOP) powder Apply topically 2 (two) times daily. Qty: 15 g, Refills: 0    pantoprazole (PROTONIX) 40 MG tablet Take 1 tablet (40 mg total) by mouth daily at 6 (six) AM. Qty: 30 tablet, Refills: 0    simvastatin (ZOCOR) 20 MG tablet Take 1 tablet (20 mg total) by mouth daily at 6 PM. Qty: 30 tablet, Refills: 0      CONTINUE these medications which have NOT CHANGED   Details  acetaminophen (TYLENOL) 325 MG tablet Take 325-650 mg by mouth every 6 (six) hours as needed for mild pain.    ibuprofen (  ADVIL,MOTRIN) 400 MG tablet Take 400 mg by mouth every 6 (six) hours as needed for mild pain.       Allergies  Allergen Reactions  . Codeine Other (See Comments)    Gets nausea and feels like"something is crawling"  . Diphenhydramine Hcl Other (See Comments)    Feels like skin crawling  . Other Other (See Comments)    CANNOT HAVE ANY "SPICY" FOODS!!   Follow-up Information    SETHI,PRAMOD, MD. Schedule an appointment as soon as possible for a visit in 2 month(s).   Specialties:  Neurology, Radiology Why:  Stroke clinic Contact information: [address] [place] [address] [phone]        md at SNF .   Why:  f/u with MD at  SNF           The results of significant diagnostics from this hospitalization (including imaging, microbiology, ancillary and laboratory) are listed below for reference.    Significant Diagnostic Studies: Ct Angio Head W Or Wo Contrast  Result Date: 11/04/2015 CLINICAL DATA:  Left-sided numbness. EXAM: CT ANGIOGRAPHY HEAD AND NECK TECHNIQUE: Multidetector CT imaging of the head and neck was performed using the standard protocol during bolus administration of intravenous contrast. Multiplanar CT image reconstructions and MIPs were obtained to evaluate the vascular anatomy. Carotid stenosis measurements (when applicable) are obtained utilizing NASCET criteria, using the distal internal carotid diameter as the denominator. CONTRAST:  75 cc Isovue 370 intravenous COMPARISON:  Noncontrast head CT from earlier today FINDINGS: CTA NECK Aortic arch: Atheromatous calcifications. No aneurysm or dissection. 3 vessel branching. Right carotid system: Mixed calcified and noncalcified plaque at the bifurcation with 20% maximal proximal ICA stenosis. Left carotid system: Atherosclerotic calcification primarily at the common carotid bifurcation which is mixed calcified and low-density. Stenosis measures up to 50% when measured coronally at the ICA origin. There is a low-density luminal filling defect along the posterior and lateral walls which appears to project into the lumen, as seen with thrombus. No evidence of dissection. Vertebral arteries:No proximal subclavian artery stenosis. Smooth and widely patent vertebral arteries with mild dominance on the left. Skeleton: No acute or aggressive process. Cervical disc and facet degeneration. Other neck: No incidental mass or adenopathy. Upper chest: Clear apical lungs CTA HEAD Anterior circulation: Carotid siphon atherosclerotic luminal irregularity and calcification. Mild to moderate right mid cavernous segment narrowing. No major branch occlusion. There is diffuse  atherosclerotic irregularity of medium size vessels. Negative for aneurysm. Posterior circulation: Mild left vertebral artery dominance. Standard vertebrobasilar branching. There is a critical right P1 segment stenosis with occluded PCA near the P1 2 junction (no post communicating artery). Negative for aneurysm. Venous sinuses: Patent Anatomic variants: Anterior communicating artery. No visible posterior communicating arteries. Delayed phase: Remote appearing right occipital pole infarct. As noted previously there may be more recent marginal ischemia. No visible thalamic infarct. Likely chronic pontomidbrain junction lacune. These results were called by telephone at the time of interpretation on 11/04/2015 at 10:36 am to Margaret. Lacretia Nicks , who verbally acknowledged these results. IMPRESSION: 1. Proximal right PCA occlusion. There is a chronic appearing right occipital pole infarct; the level of occlusion puts a much greater area at risk. 2. Cervical carotid atherosclerosis with proximal ICA stenosis of approximately 20% on the right and 50% on the left. Low-density thrombus or irregular plaque projects into the proximal left ICA lumen. 3. Diffuse intracranial atherosclerosis. Electronically Signed   By: Marnee Spring M.D.   On: 11/04/2015 10:45  Dg Chest 2 View  Result Date: 11/04/2015 CLINICAL DATA:  TIA, left-sided weakness EXAM: CHEST  2 VIEW COMPARISON:  None. FINDINGS: Cardiomediastinal silhouette is unremarkable. No acute infiltrate or pleural effusion. No pulmonary edema. Atherosclerotic calcifications of carotid siphon. Degenerative changes mid and lower thoracic spine. IMPRESSION: No active cardiopulmonary disease. Electronically Signed   By: Natasha MeadLiviu  Pop M.D.   On: 11/04/2015 14:30   Ct Head Wo Contrast  Result Date: 11/05/2015 CLINICAL DATA:  Recent stroke. Sudden onset worsening symptoms including left arm weakness. EXAM: CT HEAD WITHOUT CONTRAST TECHNIQUE: Contiguous axial images were obtained  from the base of the skull through the vertex without intravenous contrast. COMPARISON:  Head CT and brain MRI from yesterday FINDINGS: Brain: Right corona radiata and caudate body infarcts by MRI are subtle by CT. No evidence of infarct extension or cortical involvement. No hemorrhagic conversion. Remote right PCA territory infarct affecting the occipital pole. No hydrocephalus or shift. Vascular: No hyperdense vessel .  Atherosclerotic calcification Skull: Negative for fracture or focal lesion. Sinuses/Orbits: No acute finding. Other: None. IMPRESSION: 1. Right subcortical infarcts on previous brain MRI are subtle by CT. No hemorrhagic conversion or evidence of infarct progression. 2. Remote right occipital pole infarct. Electronically Signed   By: Marnee SpringJonathon  Watts M.D.   On: 11/05/2015 13:45   Ct Head Wo Contrast  Result Date: 11/04/2015 CLINICAL DATA:  Code stroke EXAM: CT HEAD WITHOUT CONTRAST TECHNIQUE: Contiguous axial images were obtained from the base of the skull through the vertex without intravenous contrast. COMPARISON:  04/09/2013 FINDINGS: No skull fracture is noted. Paranasal sinuses and mastoid air cells are unremarkable. No intracranial hemorrhage, mass effect or midline shift. Mild cerebral atrophy. Mild periventricular white matter decreased attenuation probable due to chronic small vessel ischemic changes. Bilateral basal ganglia punctate calcifications are again noted. There is interval old appearing infarct in right occipital lobe axial image 17 measures 1.6 cm. Axial image 15 there is small area of decreased attenuation in right occipital lobe. New ischemia cannot be excluded. Clinical correlation is necessary. Further correlation with MRI is recommended as clinically warranted. IMPRESSION: There is interval old appearing infarct in right occipital lobe axial image 17 measures 1.6 cm. Axial image 15 there is small area of decreased attenuation in right occipital lobe. New ischemia cannot  be excluded. Clinical correlation is necessary. Further correlation with MRI is recommended as clinically warranted. No intracranial hemorrhage, mass effect or midline shift. These results were called by telephone at the time of interpretation on 11/04/2015 at 9:01 am to Margaret. Lacretia NicksBRIAN QUIGLEY , who verbally acknowledged these results. Electronically Signed   By: Natasha MeadLiviu  Pop M.D.   On: 11/04/2015 09:02   Ct Angio Neck W And/or Wo Contrast  Result Date: 11/04/2015 CLINICAL DATA:  Left-sided numbness. EXAM: CT ANGIOGRAPHY HEAD AND NECK TECHNIQUE: Multidetector CT imaging of the head and neck was performed using the standard protocol during bolus administration of intravenous contrast. Multiplanar CT image reconstructions and MIPs were obtained to evaluate the vascular anatomy. Carotid stenosis measurements (when applicable) are obtained utilizing NASCET criteria, using the distal internal carotid diameter as the denominator. CONTRAST:  75 cc Isovue 370 intravenous COMPARISON:  Noncontrast head CT from earlier today FINDINGS: CTA NECK Aortic arch: Atheromatous calcifications. No aneurysm or dissection. 3 vessel branching. Right carotid system: Mixed calcified and noncalcified plaque at the bifurcation with 20% maximal proximal ICA stenosis. Left carotid system: Atherosclerotic calcification primarily at the common carotid bifurcation which is mixed calcified and low-density. Stenosis measures  up to 50% when measured coronally at the ICA origin. There is a low-density luminal filling defect along the posterior and lateral walls which appears to project into the lumen, as seen with thrombus. No evidence of dissection. Vertebral arteries:No proximal subclavian artery stenosis. Smooth and widely patent vertebral arteries with mild dominance on the left. Skeleton: No acute or aggressive process. Cervical disc and facet degeneration. Other neck: No incidental mass or adenopathy. Upper chest: Clear apical lungs CTA HEAD Anterior  circulation: Carotid siphon atherosclerotic luminal irregularity and calcification. Mild to moderate right mid cavernous segment narrowing. No major branch occlusion. There is diffuse atherosclerotic irregularity of medium size vessels. Negative for aneurysm. Posterior circulation: Mild left vertebral artery dominance. Standard vertebrobasilar branching. There is a critical right P1 segment stenosis with occluded PCA near the P1 2 junction (no post communicating artery). Negative for aneurysm. Venous sinuses: Patent Anatomic variants: Anterior communicating artery. No visible posterior communicating arteries. Delayed phase: Remote appearing right occipital pole infarct. As noted previously there may be more recent marginal ischemia. No visible thalamic infarct. Likely chronic pontomidbrain junction lacune. These results were called by telephone at the time of interpretation on 11/04/2015 at 10:36 am to Margaret. Lacretia Nicks , who verbally acknowledged these results. IMPRESSION: 1. Proximal right PCA occlusion. There is a chronic appearing right occipital pole infarct; the level of occlusion puts a much greater area at risk. 2. Cervical carotid atherosclerosis with proximal ICA stenosis of approximately 20% on the right and 50% on the left. Low-density thrombus or irregular plaque projects into the proximal left ICA lumen. 3. Diffuse intracranial atherosclerosis. Electronically Signed   By: Marnee Spring M.D.   On: 11/04/2015 10:45   Mr Brain Wo Contrast  Result Date: 11/04/2015 CLINICAL DATA:  Numbness in the left face and left upper extremity. Left leg weakness. Symptoms have improved. Hypertensive. EXAM: MRI HEAD WITHOUT CONTRAST MRA HEAD WITHOUT CONTRAST TECHNIQUE: Multiplanar, multiecho pulse sequences of the brain and surrounding structures were obtained without intravenous contrast. Angiographic images of the head were obtained using MRA technique without contrast. COMPARISON:  Head CT and CTA earlier today  FINDINGS: MRI HEAD FINDINGS There are small acute right lateral lenticulostriate territory infarcts involving the posterior lentiform nucleus, corona radiata, and caudate body. There is a chronic infarct in the right central pons with associated chronic blood products resulting and mild artifact on diffusion imaging. There is a small chronic right occipital cortical infarct with associated chronic blood products. No mass, midline shift, or extra-axial fluid collection is seen. Cerebral atrophy is within normal limits for age. Small foci of T2 hyperintensity scattered in the cerebral white matter are nonspecific but compatible with minimal chronic small vessel ischemic disease. A dilated perivascular space is noted inferiorly in the right basal ganglia. Orbits are unremarkable. Paranasal sinuses and mastoid air cells are clear. Major intracranial vascular flow voids are preserved. No focal osseous lesion is identified. MRA HEAD FINDINGS The visualized distal vertebral arteries are patent to the basilar with the left being dominant. PICA origins, left AICA origin, and SCA origins are patent. A right AICA is not clearly identified. The basilar artery is widely patent. The left PCA is patent without evidence of significant proximal stenosis. A severe right P1 stenosis is again seen with occlusion of the right PCA near the P1 - P2 junction. The internal carotid arteries are patent from skullbase to carotid termini. There is atherosclerotic carotid siphon irregularity bilaterally. There is mild right supraclinoid ICA stenosis. There is a 1.5  mm medial outpouching from the proximal left supraclinoid ICA in the superior hypophyseal region compatible with a tiny aneurysm. ACAs and MCAs are patent without evidence of major branch occlusion or significant proximal stenosis. IMPRESSION: 1. Small acute right basal ganglia region infarcts. 2. Chronic infarcts in the right occipital lobe and pons. 3. Proximal right PCA occlusion  as described on earlier CTA. 4. No major branch occlusion or flow limiting proximal stenosis in the anterior circulation. Mild right supraclinoid ICA stenosis. 5. 1.5 mm left supraclinoid ICA aneurysm. Electronically Signed   By: Sebastian Ache M.D.   On: 11/04/2015 21:16   Mr Maxine Glenn Head/brain NW Cm  Result Date: 11/04/2015 CLINICAL DATA:  Numbness in the left face and left upper extremity. Left leg weakness. Symptoms have improved. Hypertensive. EXAM: MRI HEAD WITHOUT CONTRAST MRA HEAD WITHOUT CONTRAST TECHNIQUE: Multiplanar, multiecho pulse sequences of the brain and surrounding structures were obtained without intravenous contrast. Angiographic images of the head were obtained using MRA technique without contrast. COMPARISON:  Head CT and CTA earlier today FINDINGS: MRI HEAD FINDINGS There are small acute right lateral lenticulostriate territory infarcts involving the posterior lentiform nucleus, corona radiata, and caudate body. There is a chronic infarct in the right central pons with associated chronic blood products resulting and mild artifact on diffusion imaging. There is a small chronic right occipital cortical infarct with associated chronic blood products. No mass, midline shift, or extra-axial fluid collection is seen. Cerebral atrophy is within normal limits for age. Small foci of T2 hyperintensity scattered in the cerebral white matter are nonspecific but compatible with minimal chronic small vessel ischemic disease. A dilated perivascular space is noted inferiorly in the right basal ganglia. Orbits are unremarkable. Paranasal sinuses and mastoid air cells are clear. Major intracranial vascular flow voids are preserved. No focal osseous lesion is identified. MRA HEAD FINDINGS The visualized distal vertebral arteries are patent to the basilar with the left being dominant. PICA origins, left AICA origin, and SCA origins are patent. A right AICA is not clearly identified. The basilar artery is widely  patent. The left PCA is patent without evidence of significant proximal stenosis. A severe right P1 stenosis is again seen with occlusion of the right PCA near the P1 - P2 junction. The internal carotid arteries are patent from skullbase to carotid termini. There is atherosclerotic carotid siphon irregularity bilaterally. There is mild right supraclinoid ICA stenosis. There is a 1.5 mm medial outpouching from the proximal left supraclinoid ICA in the superior hypophyseal region compatible with a tiny aneurysm. ACAs and MCAs are patent without evidence of major branch occlusion or significant proximal stenosis. IMPRESSION: 1. Small acute right basal ganglia region infarcts. 2. Chronic infarcts in the right occipital lobe and pons. 3. Proximal right PCA occlusion as described on earlier CTA. 4. No major branch occlusion or flow limiting proximal stenosis in the anterior circulation. Mild right supraclinoid ICA stenosis. 5. 1.5 mm left supraclinoid ICA aneurysm. Electronically Signed   By: Sebastian Ache M.D.   On: 11/04/2015 21:16    Microbiology: No results found for this or any previous visit (from the past 240 hour(s)).   Labs: Basic Metabolic Panel:  Recent Labs Lab 11/04/15 0912 11/04/15 1336 11/05/15 0938 11/06/15 0501 11/08/15 0553  NA 139 140 137 137 139  K 5.0 3.9 4.0 4.0 3.8  CL 103 102 103 103 104  CO2 30  --  26 23 27   GLUCOSE 199* 191* 153* 145* 137*  BUN 13 10 8  6  11  CREATININE 0.85 0.70 0.83 0.61 0.86  CALCIUM 9.8  --  9.3 9.0 9.3  MG  --   --  2.1  --   --    Liver Function Tests:  Recent Labs Lab 11/04/15 0912  AST 20  ALT 15  ALKPHOS 72  BILITOT 0.9  PROT 7.9  ALBUMIN 4.3   No results for input(s): LIPASE, AMYLASE in the last 168 hours. No results for input(s): AMMONIA in the last 168 hours. CBC:  Recent Labs Lab 11/04/15 0839 11/04/15 1336 11/05/15 0938  WBC 6.2  --  5.6  NEUTROABS 4.0  --  3.7  HGB 15.4 15.3* 14.0  HCT 44.9 45.0 44.1  MCV 87.9  --   92.3  PLT 165  --  183   Cardiac Enzymes: No results for input(s): CKTOTAL, CKMB, CKMBINDEX, TROPONINI in the last 168 hours. BNP: BNP (last 3 results) No results for input(s): BNP in the last 8760 hours.  ProBNP (last 3 results) No results for input(s): PROBNP in the last 8760 hours.  CBG:  Recent Labs Lab 11/07/15 1144 11/07/15 1650 11/07/15 2206 11/08/15 0631 11/08/15 1141  GLUCAP 146* 144* 121* 141* 177*       Signed:  Lucciana Head MD.  [facility] 11/08/2015, 3:46 PM

## 2015-11-08 NOTE — Progress Notes (Signed)
When Case Manager interviews patient they should also call her son Jhonnie GarnerShannon Petter [phone].

## 2015-11-08 NOTE — Progress Notes (Signed)
Occupational Therapy Treatment Patient Details Name: Margaret Mcintosh MRN: [id number] DOB: 04/14/1932 Today's Date: 11/08/2015    History of present illness Pt was admitted with stroke-like symptoms, L sided weakness.  MRI (+) 1. Small acute right basal ganglia region infarcts. chronic R occipital / pon  infarc PMH: H/O HTN and DM   OT comments  Pt demonstrates good return of L UE and session focued on grasp with L hand.pt transferring this session with RW. Recommendation changed to CIR at this time.   Family requesting CM contact son [name].    Follow Up Recommendations  CIR;Supervision/Assistance - 24 hour    Equipment Recommendations  3 in 1 bedside comode    Recommendations for Other Services Rehab consult    Precautions / Restrictions Precautions Precautions: Fall       Mobility Bed Mobility               General bed mobility comments: in chair on arrival   Transfers Overall transfer level: Needs assistance Equipment used: Rolling walker (2 wheeled) Transfers: Sit to/from Stand Sit to Stand: Min assist Stand pivot transfers: Mod assist       General transfer comment: Pt needed cues for RW safety    Balance Overall balance assessment: Needs assistance Sitting-balance support: No upper extremity supported;Feet supported Sitting balance-Leahy Scale: Good Sitting balance - Comments: seated balance activities at edge of chair with arms crossed reciprocal scooting, seated marching and ant post leans for trunk stabilization/core strength   Standing balance support: Bilateral upper extremity supported;During functional activity Standing balance-Leahy Scale: Poor Standing balance comment: requires bil UE on RW for support                   ADL Overall ADL's : Needs assistance/impaired                         Toilet Transfer: Minimal assistance;Ambulation           Functional mobility during ADLs: Minimal assistance;Rolling  walker General ADL Comments: pt need cues for safety and sequence with RW. pt advised to step with weak L Le first inside RW. pt demonstrate cylinderal grasp with L hand and able to hold lotion bottle. Pt able to bring bottle to mouth and shoulder flexion ~ 70 Degrees without accessory muscules. pt educated on scapula retraction , elevation and scapula depression. pt able to complete shoulder ~90 degrees AROM correctly without accessory muscles. Pt educated on knee extension for quads in chair for exercise.       Vision                     Perception     Praxis      Cognition   Behavior During Therapy: WFL for tasks assessed/performed Overall Cognitive Status: Within Functional Limits for tasks assessed                       Extremity/Trunk Assessment               Exercises     Shoulder Instructions       General Comments      Pertinent Vitals/ Pain       Pain Assessment: No/denies pain  Home Living  Prior Functioning/Environment              Frequency Min 3X/week     Progress Toward Goals  OT Goals(current goals can now be found in the care plan section)  Progress towards OT goals: Progressing toward goals  Acute Rehab OT Goals Patient Stated Goal: get better OT Goal Formulation: With patient Time For Goal Achievement: 11/19/15 Potential to Achieve Goals: Good ADL Goals Pt Will Perform Grooming: with set-up;sitting Pt Will Transfer to Toilet: with min assist;with +2 assist;squat pivot transfer;bedside commode Additional ADL Goal #1: pt will use LUE as stabilizer/gross assist with min cues Additional ADL Goal #2: pt will be independent with AAROM HEP Additional ADL Goal #3: pt will go from sit to stand with min A +2 safety and maintain with min A for adls  Plan Discharge plan remains appropriate    Co-evaluation                 End of Session Equipment Utilized  During Treatment: Gait belt;Rolling walker   Activity Tolerance Patient tolerated treatment well   Patient Left in chair;with call bell/phone within reach;with chair alarm set;with family/visitor present (coworker visiting at end of session)   Nurse Communication Mobility status;Precautions        Time: 1250 (1250)-1306 OT Time Calculation (min): 16 min  Charges: OT General Charges $OT Visit: 1 Procedure OT Treatments $Therapeutic Activity: 8-22 mins  Margaret Mcintosh, Margaret Mcintosh 11/08/2015, 2:09 PM  Mateo FlowJones, Brynn   OTR/L Pager: [phone] Office: [phone] .

## 2015-11-08 NOTE — Clinical Social Work Note (Signed)
Clinical Social Work Assessment  Patient Details  Name: Margaret Mcintosh MRN: [id number] Date of Birth: 1932-12-21  Date of referral:  11/08/15               Reason for consult:  Facility Placement                Permission sought to share information with:  Family Supports Permission granted to share information::  Yes, Verbal Permission Granted  Name::     Kaniyah Lisby  Relationship::  son  Contact Information:  [phone]  Housing/Transportation Living arrangements for the past 2 months:  [place] of Information:  Patient, Adult Children Patient Interpreter Needed:  None Criminal Activity/Legal Involvement Pertinent to Current Situation/Hospitalization:  No - Comment as needed Significant Relationships:  Adult Children Lives with:  Self Do you feel safe going back to the place where you live?  Yes Need for family participation in patient care:  Yes (Comment)  Care giving concerns:  No care giving concerns identified.   Social Worker assessment / plan:  CSW met with pt to address consult for New SNF. Pt is ready for discharge today, per MD. CSW introduced herself and explained role of social work. CSW also explained the process of discharging to SNF. CSW initiated SNF search and followed up with bed offers. CSW spoke to pt's son, per pt's request regarding bed choice. CSW spoke to pt's son, and he prefers [facility] or [facility]. Both facilities made bed offers and pt's so chose [facility] Facility is ready to admit pt as they have received discharge information. Pt and son are aware and agreeable to discharge plan. RN called report and PTAR provided transportation. CSW is singing off as no further needs identified.   Employment status:  Retired Forensic scientist:  Medicare PT Recommendations:  [place] / Referral to community resources:  [place]  Patient/Family's Response to care:  Pt and son  were appreciative of CSW support.   Patient/Family's Understanding of and Emotional Response to Diagnosis, Current Treatment, and Prognosis:  Pt and son understand that pt would benefit from STR at SNF prior to returning home.   Emotional Assessment Appearance:  Appears stated age Attitude/Demeanor/Rapport:  Other (Appropriate) Affect (typically observed):  Accepting, Adaptable, Pleasant Orientation:  Oriented to Self, Oriented to Place, Oriented to  Time, Oriented to Situation Alcohol / Substance use:  Never Used Psych involvement (Current and /or in the community):  No (Comment)  Discharge Needs  Concerns to be addressed:  Adjustment to Illness Readmission within the last 30 days:  No Current discharge risk:  None Barriers to Discharge:  No Barriers Identified   Darden Dates, LCSW 11/08/2015, 5:11 PM

## 2015-11-08 NOTE — Clinical Social Work Placement (Signed)
   CLINICAL SOCIAL WORK PLACEMENT  NOTE  Date:  11/08/2015  Patient Details  Name: Margaret Mcintosh MRN: [id number] Date of Birth: [birth year]  Clinical Social Work is seeking post-discharge placement for this patient at the Skilled  Nursing Facility level of care (*CSW will initial, date and re-position this form in  chart as items are completed):  Yes   Patient/family provided with [facility] Clinical Social Work Department's list of facilities offering this level of care within the geographic area requested by the patient (or if unable, by the patient's family).  Yes   Patient/family informed of their freedom to choose among providers that offer the needed level of care, that participate in Medicare, Medicaid or managed care program needed by the patient, have an available bed and are willing to accept the patient.  Yes   Patient/family informed of [facility]'s ownership interest in [facility] and [facility], as well as of the fact that they are under no obligation to receive care at these facilities.  PASRR submitted to EDS on 11/06/15     PASRR number received on 11/06/15     Existing PASRR number confirmed on       FL2 transmitted to all facilities in geographic area requested by pt/family on 11/08/15     FL2 transmitted to all facilities within larger geographic area on       Patient informed that his/her managed care company has contracts with or will negotiate with certain facilities, including the following:        Yes   Patient/family informed of bed offers received.  [facility]     Physician recommends and patient chooses bed at      [facility] 11[facility]08[facility].  Patient to be transferred to facility by PTAR     Patient family notified on 11/08/15 of transfer.  Name of family member notified:  Son, [name]     PHYSICIAN       Additional Comment:     _______________________________________________ Dede QuerySarah Zealand Boyett, LCSW 11/08/2015, 5:12 PM

## 2015-11-08 NOTE — Progress Notes (Signed)
   11/08/15 0900  Clinical Encounter Type  Visited With Patient  Visit Type Initial;Other (Comment) (Adv Dir/HCPOA)  Referral From Nurse;Patient  Spiritual Encounters  Spiritual Needs Literature  Ch responded to consult for Adv. Dir. Request; pt would like to have son from [place] present to aid; [facility] and [facility] available to support in the completion and notarization of document.  Erline LevineMichael I Emmani Lesueur 9:49 AM

## 2015-11-08 NOTE — Progress Notes (Signed)
Physical Therapy Treatment Patient Details Name: YULIA ULRICH MRN: [id number] DOB: 27-Oct-1932 Today's Date: 11/08/2015    History of Present Illness Pt was admitted with stroke-like symptoms, L sided weakness.  H/O HTN and DM    PT Comments    Patient progressing much since last session meeting acute PT goals.  Feel she is able to tolerate CIR level therapies and has potential to achieve mod I level.  Will follow acutely.   Follow Up Recommendations  CIR     Equipment Recommendations  Other (comment) (TBA)    Recommendations for Other Services Rehab consult     Precautions / Restrictions Precautions Precautions: Fall    Mobility  Bed Mobility               General bed mobility comments: up in chair  Transfers Overall transfer level: Needs assistance Equipment used: Rolling walker (2 wheeled) Transfers: Sit to/from Stand Sit to Stand: Min assist            Ambulation/Gait Ambulation/Gait assistance: Min assist;Mod assist Ambulation Distance (Feet): 60 Feet Assistive device: Rolling walker (2 wheeled) Gait Pattern/deviations: Step-through pattern;Shuffle;Decreased dorsiflexion - left;Decreased stance time - left     General Gait Details: L LE stable in stance for initial ambulation, then buckling after turned in hallway to return to room, L foot drags about 50% of the time, able to keep L hand on walker   Stairs            Wheelchair Mobility    Modified Rankin (Stroke Patients Only) Modified Rankin (Stroke Patients Only) Pre-Morbid Rankin Score: No symptoms Modified Rankin: Moderately severe disability     Balance Overall balance assessment: Needs assistance   Sitting balance-Leahy Scale: Good Sitting balance - Comments: seated balance activities at edge of chair with arms crossed reciprocal scooting, seated marching and ant post leans for trunk stabilization/core strength   Standing balance support: Bilateral upper extremity  supported Standing balance-Leahy Scale: Poor Standing balance comment: UE support for balance in standing                    Cognition Arousal/Alertness: Awake/alert Behavior During Therapy: WFL for tasks assessed/performed Overall Cognitive Status: Within Functional Limits for tasks assessed                      Exercises      General Comments        Pertinent Vitals/Pain Pain Assessment: No/denies pain (with walking)    Home Living                      Prior Function            PT Goals (current goals can now be found in the care plan section) Progress towards PT goals: Goals met and updated - see care plan    Frequency  Min 4X/week    PT Plan Discharge plan needs to be updated    Co-evaluation             End of Session Equipment Utilized During Treatment: Gait belt Activity Tolerance: Patient tolerated treatment well Patient left: in chair;with chair alarm set     Time: 6387-5643 PT Time Calculation (min) (ACUTE ONLY): 28 min  Charges:  $Gait Training: 8-22 mins $Neuromuscular Re-education: 8-22 mins                    G Codes:      Caren Griffins  Milica Gully 11/08/2015, 12:46 PM  Magda Kiel, [place] 11/08/2015

## 2015-11-08 NOTE — Care Management Note (Signed)
Case Management Note  Patient Details  Name: Margaret LawlessShelvia S Mcintosh MRN: [id number] Date of Birth: 1932-04-18  Subjective/Objective:                    Action/Plan: Patient discharging to SNF. No further needs per CM.   Expected Discharge Date:                  Expected Discharge Plan:  Skilled Nursing Facility  In-House Referral:  Clinical Social Work  Discharge planning Services  CM Consult  Post Acute Care Choice:    Choice offered to:     DME Arranged:    DME Agency:     HH Arranged:    HH Agency:     Status of Service:  Completed, signed off  If discussed at [employer] Meetings, dates discussed:    Additional Comments:  Kermit BaloKelli F Auriana Scalia, RN 11/08/2015, 4:27 PM

## 2015-11-09 LAB — GLUCOSE, CAPILLARY
GLUCOSE-CAPILLARY: 150 mg/dL — AB (ref 65–99)
GLUCOSE-CAPILLARY: 178 mg/dL — AB (ref 65–99)
Glucose-Capillary: 174 mg/dL — ABNORMAL HIGH (ref 65–99)
Glucose-Capillary: 208 mg/dL — ABNORMAL HIGH (ref 65–99)

## 2015-11-11 LAB — GLUCOSE, CAPILLARY
GLUCOSE-CAPILLARY: 271 mg/dL — AB (ref 65–99)
GLUCOSE-CAPILLARY: 141 mg/dL — AB (ref 65–99)
GLUCOSE-CAPILLARY: 155 mg/dL — AB (ref 65–99)
Glucose-Capillary: 140 mg/dL — ABNORMAL HIGH (ref 65–99)
Glucose-Capillary: 173 mg/dL — ABNORMAL HIGH (ref 65–99)
Glucose-Capillary: 154 mg/dL — ABNORMAL HIGH (ref 65–99)
Glucose-Capillary: 152 mg/dL — ABNORMAL HIGH (ref 65–99)
Glucose-Capillary: 198 mg/dL — ABNORMAL HIGH (ref 65–99)

## 2015-11-12 LAB — GLUCOSE, CAPILLARY
GLUCOSE-CAPILLARY: 154 mg/dL — AB (ref 65–99)
GLUCOSE-CAPILLARY: 168 mg/dL — AB (ref 65–99)
Glucose-Capillary: 182 mg/dL — ABNORMAL HIGH (ref 65–99)
Glucose-Capillary: 173 mg/dL — ABNORMAL HIGH (ref 65–99)

## 2015-11-13 LAB — GLUCOSE, CAPILLARY
GLUCOSE-CAPILLARY: 145 mg/dL — AB (ref 65–99)
GLUCOSE-CAPILLARY: 150 mg/dL — AB (ref 65–99)
GLUCOSE-CAPILLARY: 178 mg/dL — AB (ref 65–99)
GLUCOSE-CAPILLARY: 232 mg/dL — AB (ref 65–99)

## 2015-11-14 LAB — GLUCOSE, CAPILLARY
GLUCOSE-CAPILLARY: 242 mg/dL — AB (ref 65–99)
GLUCOSE-CAPILLARY: 222 mg/dL — AB (ref 65–99)
Glucose-Capillary: 158 mg/dL — ABNORMAL HIGH (ref 65–99)
Glucose-Capillary: 153 mg/dL — ABNORMAL HIGH (ref 65–99)

## 2015-11-15 LAB — GLUCOSE, CAPILLARY
GLUCOSE-CAPILLARY: 203 mg/dL — AB (ref 65–99)
Glucose-Capillary: 141 mg/dL — ABNORMAL HIGH (ref 65–99)
Glucose-Capillary: 173 mg/dL — ABNORMAL HIGH (ref 65–99)
Glucose-Capillary: 195 mg/dL — ABNORMAL HIGH (ref 65–99)

## 2015-11-16 LAB — GLUCOSE, CAPILLARY
GLUCOSE-CAPILLARY: 172 mg/dL — AB (ref 65–99)
GLUCOSE-CAPILLARY: 174 mg/dL — AB (ref 65–99)
GLUCOSE-CAPILLARY: 196 mg/dL — AB (ref 65–99)
Glucose-Capillary: 148 mg/dL — ABNORMAL HIGH (ref 65–99)

## 2015-11-17 LAB — GLUCOSE, CAPILLARY
GLUCOSE-CAPILLARY: 141 mg/dL — AB (ref 65–99)
GLUCOSE-CAPILLARY: 186 mg/dL — AB (ref 65–99)
GLUCOSE-CAPILLARY: 140 mg/dL — AB (ref 65–99)
Glucose-Capillary: 216 mg/dL — ABNORMAL HIGH (ref 65–99)

## 2015-11-20 LAB — GLUCOSE, CAPILLARY
GLUCOSE-CAPILLARY: 176 mg/dL — AB (ref 65–99)
GLUCOSE-CAPILLARY: 160 mg/dL — AB (ref 65–99)
GLUCOSE-CAPILLARY: 141 mg/dL — AB (ref 65–99)
GLUCOSE-CAPILLARY: 183 mg/dL — AB (ref 65–99)
Glucose-Capillary: 138 mg/dL — ABNORMAL HIGH (ref 65–99)
Glucose-Capillary: 153 mg/dL — ABNORMAL HIGH (ref 65–99)
Glucose-Capillary: 190 mg/dL — ABNORMAL HIGH (ref 65–99)
Glucose-Capillary: 143 mg/dL — ABNORMAL HIGH (ref 65–99)
Glucose-Capillary: 166 mg/dL — ABNORMAL HIGH (ref 65–99)
Glucose-Capillary: 288 mg/dL — ABNORMAL HIGH (ref 65–99)
Glucose-Capillary: 180 mg/dL — ABNORMAL HIGH (ref 65–99)

## 2015-11-21 LAB — GLUCOSE, CAPILLARY: Glucose-Capillary: 177 mg/dL — ABNORMAL HIGH (ref 65–99)

## 2015-11-22 LAB — GLUCOSE, CAPILLARY
GLUCOSE-CAPILLARY: 175 mg/dL — AB (ref 65–99)
GLUCOSE-CAPILLARY: 165 mg/dL — AB (ref 65–99)
GLUCOSE-CAPILLARY: 147 mg/dL — AB (ref 65–99)
GLUCOSE-CAPILLARY: 184 mg/dL — AB (ref 65–99)
Glucose-Capillary: 134 mg/dL — ABNORMAL HIGH (ref 65–99)
Glucose-Capillary: 144 mg/dL — ABNORMAL HIGH (ref 65–99)
Glucose-Capillary: 178 mg/dL — ABNORMAL HIGH (ref 65–99)
Glucose-Capillary: 147 mg/dL — ABNORMAL HIGH (ref 65–99)

## 2015-11-23 LAB — GLUCOSE, CAPILLARY
GLUCOSE-CAPILLARY: 166 mg/dL — AB (ref 65–99)
GLUCOSE-CAPILLARY: 165 mg/dL — AB (ref 65–99)
Glucose-Capillary: 134 mg/dL — ABNORMAL HIGH (ref 65–99)
Glucose-Capillary: 191 mg/dL — ABNORMAL HIGH (ref 65–99)

## 2015-11-23 LAB — URINALYSIS COMPLETE WITH MICROSCOPIC (ARMC ONLY)
BILIRUBIN URINE: NEGATIVE
GLUCOSE, UA: NEGATIVE mg/dL
KETONES UR: NEGATIVE mg/dL
NITRITE: NEGATIVE
Protein, ur: NEGATIVE mg/dL
SPECIFIC GRAVITY, URINE: 1.016 (ref 1.005–1.030)
pH: 5 (ref 5.0–8.0)

## 2015-11-24 LAB — GLUCOSE, CAPILLARY
GLUCOSE-CAPILLARY: 205 mg/dL — AB (ref 65–99)
Glucose-Capillary: 160 mg/dL — ABNORMAL HIGH (ref 65–99)
Glucose-Capillary: 152 mg/dL — ABNORMAL HIGH (ref 65–99)
Glucose-Capillary: 160 mg/dL — ABNORMAL HIGH (ref 65–99)

## 2015-11-25 LAB — GLUCOSE, CAPILLARY
GLUCOSE-CAPILLARY: 119 mg/dL — AB (ref 65–99)
Glucose-Capillary: 170 mg/dL — ABNORMAL HIGH (ref 65–99)
Glucose-Capillary: 186 mg/dL — ABNORMAL HIGH (ref 65–99)
Glucose-Capillary: 164 mg/dL — ABNORMAL HIGH (ref 65–99)

## 2015-11-26 ENCOUNTER — Non-Acute Institutional Stay (SKILLED_NURSING_FACILITY): Payer: Medicare Other | Admitting: Gerontology

## 2015-11-26 ENCOUNTER — Encounter
Admission: RE | Admit: 2015-11-26 | Discharge: 2015-11-26 | Disposition: A | Discharge status: Home / Self Care | Payer: Medicare Other | Source: Ambulatory Visit | Attending: Internal Medicine | Admitting: Internal Medicine

## 2015-11-26 DIAGNOSIS — E119 Type 2 diabetes mellitus without complications: Secondary | ICD-10-CM | POA: Insufficient documentation

## 2015-11-26 DIAGNOSIS — I639 Cerebral infarction, unspecified: Secondary | ICD-10-CM | POA: Diagnosis not present

## 2015-11-26 LAB — GLUCOSE, CAPILLARY
Glucose-Capillary: 133 mg/dL — ABNORMAL HIGH (ref 65–99)
Glucose-Capillary: 154 mg/dL — ABNORMAL HIGH (ref 65–99)
Glucose-Capillary: 153 mg/dL — ABNORMAL HIGH (ref 65–99)
Glucose-Capillary: 181 mg/dL — ABNORMAL HIGH (ref 65–99)

## 2015-11-27 LAB — GLUCOSE, CAPILLARY
GLUCOSE-CAPILLARY: 165 mg/dL — AB (ref 65–99)
GLUCOSE-CAPILLARY: 182 mg/dL — AB (ref 65–99)
Glucose-Capillary: 140 mg/dL — ABNORMAL HIGH (ref 65–99)
Glucose-Capillary: 174 mg/dL — ABNORMAL HIGH (ref 65–99)

## 2015-11-28 LAB — GLUCOSE, CAPILLARY
GLUCOSE-CAPILLARY: 160 mg/dL — AB (ref 65–99)
Glucose-Capillary: 142 mg/dL — ABNORMAL HIGH (ref 65–99)

## 2015-11-30 NOTE — Progress Notes (Signed)
Location:      Place of Service:  SNF (31)  Provider: Lorenso Quarry, NP-C  PCP: Delton Prairie, MD Patient Care Team: Delton Prairie, MD as PCP - General (Pediatrics)  Extended Emergency Contact Information Primary Emergency Contact: Ainsley Spinner [place] Phone: [phone] Relation: Son Secondary Emergency Contact: Delila Spence Address: [address]          [place], [place] [address] [place] Home Phone: [phone] Relation: Daughter  Code Status:  DNR Goals of care:  Advanced Directive information Advanced Directives 11/04/2015  Does patient have an advance directive? Yes  Type of Advance Directive -  Copy of advanced directive(s) in chart? -  Would patient like information on creating an advanced directive? Yes - Educational materials given     Allergies  Allergen Reactions  . Codeine Other (See Comments)    Gets nausea and feels like"something is crawling"  . Diphenhydramine Hcl Other (See Comments)    Feels like skin crawling  . Other Other (See Comments)    CANNOT HAVE ANY "SPICY" FOODS!!    Chief Complaint  Patient presents with  . Discharge Note    HPI:  82 y.o. female was admitted to the facility for rehab following a right basilar ganglia infarct with previous cva's/ tia's. Pt progressed well with therapy. Denies pain. Denies SOB. Denies n/v/d/f/c/cp/sob/ha/abd pain/dizziness, etc. Pt has her rolling walker, visiting with a friend. Ready for discharge. She will be going home with her son to [place] for an unspecified amount of time for increased rehab. VSS, no other c/o.    Past Medical History:  Diagnosis Date  . Arthritis   . Cataract   . Complication of anesthesia    "takes a long time to wake me up" (11/04/2015)  . Diabetes mellitus (HCC)   . GERD (gastroesophageal reflux disease)   . Glaucoma   . Hypertension     Past Surgical History:  Procedure Laterality Date  . ANKLE FRACTURE SURGERY Left 2000   "screw is still there" (11/04/2015)  . APPENDECTOMY  ~1960  . FRACTURE SURGERY    . VAGINAL HYSTERECTOMY  1973      reports that she has never smoked. She has never used smokeless tobacco. She reports that she does not drink alcohol or use drugs. Social History   Social History  . Marital status: Widowed    Spouse name: N/A  . Number of children: N/A  . Years of education: N/A   Occupational History  . Not on file.   Social History Main Topics  . Smoking status: Never Smoker  . Smokeless tobacco: Never Used  . Alcohol use No  . Drug use: No  . Sexual activity: No   Other Topics Concern  . Not on file   Social History Narrative  . No narrative on file   Functional Status Survey:    Allergies  Allergen Reactions  . Codeine Other (See Comments)    Gets nausea and feels like"something is crawling"  . Diphenhydramine Hcl Other (See Comments)    Feels like skin crawling  . Other Other (See Comments)    CANNOT HAVE ANY "SPICY" FOODS!!    There are no preventive care reminders to display for this patient.  Medications:   Medication List       Accurate as of 11/26/15 11:59 PM. Always use your most recent med list.          acetaminophen 325 MG tablet Commonly known as:  TYLENOL Take 325-650 mg by mouth every 6 (six) hours as needed for mild pain.   amLODipine 5 MG tablet Commonly known as:  NORVASC Take 1 tablet (5 mg total) by mouth daily.   aspirin 325 MG EC tablet Take 1 tablet (325 mg total) by mouth daily.   ibuprofen 400 MG tablet Commonly known as:  ADVIL,MOTRIN Take 400 mg by mouth every 6 (six) hours as needed for mild pain.   labetalol 100 MG tablet Commonly known as:  NORMODYNE Take 1 tablet (100 mg total) by mouth 3 (three) times daily.   nystatin powder Commonly known as:  MYCOSTATIN/NYSTOP Apply topically 2 (two) times daily.   pantoprazole 40 MG tablet Commonly known as:  PROTONIX Take 1 tablet (40 mg total) by mouth daily at 6 (six)  AM.   simvastatin 20 MG tablet Commonly known as:  ZOCOR Take 1 tablet (20 mg total) by mouth daily at 6 PM.       Review of Systems  Constitutional: Negative for activity change, appetite change, chills, diaphoresis and fever.  HENT: Negative for congestion, sneezing, sore throat, trouble swallowing and voice change.   Eyes: Negative for pain, redness and visual disturbance.  Respiratory: Negative for apnea, cough, choking, chest tightness, shortness of breath and wheezing.   Cardiovascular: Negative for chest pain, palpitations and leg swelling.  Gastrointestinal: Negative for abdominal distention, abdominal pain, constipation, diarrhea and nausea.  Genitourinary: Negative for difficulty urinating, dysuria, frequency and urgency.  Musculoskeletal: Negative for back pain, gait problem and myalgias. Arthralgias: typical arthritis.  Skin: Negative for color change, pallor, rash and wound.  Neurological: Negative for dizziness, tremors, syncope, speech difficulty, weakness, numbness and headaches.  Psychiatric/Behavioral: Negative for agitation and behavioral problems.  All other systems reviewed and are negative.   Vitals:   11/26/15 0500  BP: (!) 132/53  Pulse: (!) 56  Resp: 19  Temp: 98 F (36.7 C)  SpO2: 98%   There is no height or weight on file to calculate BMI. Physical Exam  Constitutional: She is oriented to person, place, and time. Vital signs are normal. She appears well-developed and well-nourished. She is active and cooperative. She does not appear ill. No distress.  HENT:  Head: Normocephalic and atraumatic.  Mouth/Throat: Uvula is midline, oropharynx is clear and moist and mucous membranes are normal. Mucous membranes are not pale, not dry and not cyanotic.  Eyes: Conjunctivae, EOM and lids are normal. Pupils are equal, round, and reactive to light.  Neck: Trachea normal, normal range of motion and full passive range of motion without pain. Neck supple. No JVD  present. No tracheal deviation, no edema and no erythema present. No thyromegaly present.  Cardiovascular: Normal rate, regular rhythm, normal heart sounds, intact distal pulses and normal pulses.  Exam reveals no gallop and no distant heart sounds.   Pulmonary/Chest: Effort normal and breath sounds normal. No accessory muscle usage. No respiratory distress. She has no wheezes. She has no rales. She exhibits no tenderness.  Abdominal: Normal appearance and bowel sounds are normal. She exhibits no distension and no ascites. There is no tenderness.  Musculoskeletal: Normal range of motion. She exhibits no edema or tenderness.  Expected osteoarthritis, stiffness  Neurological: She is alert and oriented to person, place, and time. She has normal strength.  Skin: Skin is warm, dry and intact. No rash noted. She is not diaphoretic. No cyanosis or erythema. No pallor. Nails show no clubbing.  Psychiatric: She has a normal mood and affect. Her  speech is normal and behavior is normal. Judgment and thought content normal. Cognition and memory are normal.  Nursing note and vitals reviewed.   Labs reviewed: Basic Metabolic Panel:  Recent Labs  16/10/96 0938 11/06/15 0501 11/08/15 0553  NA 137 137 139  K 4.0 4.0 3.8  CL 103 103 104  CO2 26 23 27   GLUCOSE 153* 145* 137*  BUN 8 6 11   CREATININE 0.83 0.61 0.86  CALCIUM 9.3 9.0 9.3  MG 2.1  --   --    Liver Function Tests:  Recent Labs  11/04/15 0912  AST 20  ALT 15  ALKPHOS 72  BILITOT 0.9  PROT 7.9  ALBUMIN 4.3   No results for input(s): LIPASE, AMYLASE in the last 8760 hours. No results for input(s): AMMONIA in the last 8760 hours. CBC:  Recent Labs  11/04/15 0839 11/04/15 1336 11/05/15 0938  WBC 6.2  --  5.6  NEUTROABS 4.0  --  3.7  HGB 15.4 15.3* 14.0  HCT 44.9 45.0 44.1  MCV 87.9  --  92.3  PLT 165  --  183   Cardiac Enzymes: No results for input(s): CKTOTAL, CKMB, CKMBINDEX, TROPONINI in the last 8760  hours. BNP: Invalid input(s): POCBNP CBG:  Recent Labs  11/27/15 1932 11/28/15 0710 11/28/15 1125  GLUCAP 174* 142* 160*    Procedures and Imaging Studies During Stay: Ct Angio Head W Or Wo Contrast  Result Date: 11/04/2015 CLINICAL DATA:  Left-sided numbness. EXAM: CT ANGIOGRAPHY HEAD AND NECK TECHNIQUE: Multidetector CT imaging of the head and neck was performed using the standard protocol during bolus administration of intravenous contrast. Multiplanar CT image reconstructions and MIPs were obtained to evaluate the vascular anatomy. Carotid stenosis measurements (when applicable) are obtained utilizing NASCET criteria, using the distal internal carotid diameter as the denominator. CONTRAST:  75 cc Isovue 370 intravenous COMPARISON:  Noncontrast head CT from earlier today FINDINGS: CTA NECK Aortic arch: Atheromatous calcifications. No aneurysm or dissection. 3 vessel branching. Right carotid system: Mixed calcified and noncalcified plaque at the bifurcation with 20% maximal proximal ICA stenosis. Left carotid system: Atherosclerotic calcification primarily at the common carotid bifurcation which is mixed calcified and low-density. Stenosis measures up to 50% when measured coronally at the ICA origin. There is a low-density luminal filling defect along the posterior and lateral walls which appears to project into the lumen, as seen with thrombus. No evidence of dissection. Vertebral arteries:No proximal subclavian artery stenosis. Smooth and widely patent vertebral arteries with mild dominance on the left. Skeleton: No acute or aggressive process. Cervical disc and facet degeneration. Other neck: No incidental mass or adenopathy. Upper chest: Clear apical lungs CTA HEAD Anterior circulation: Carotid siphon atherosclerotic luminal irregularity and calcification. Mild to moderate right mid cavernous segment narrowing. No major branch occlusion. There is diffuse atherosclerotic irregularity of medium  size vessels. Negative for aneurysm. Posterior circulation: Mild left vertebral artery dominance. Standard vertebrobasilar branching. There is a critical right P1 segment stenosis with occluded PCA near the P1 2 junction (no post communicating artery). Negative for aneurysm. Venous sinuses: Patent Anatomic variants: Anterior communicating artery. No visible posterior communicating arteries. Delayed phase: Remote appearing right occipital pole infarct. As noted previously there may be more recent marginal ischemia. No visible thalamic infarct. Likely chronic pontomidbrain junction lacune. These results were called by telephone at the time of interpretation on 11/04/2015 at 10:36 am to Dr. Lacretia Nicks , who verbally acknowledged these results. IMPRESSION: 1. Proximal right PCA occlusion. There is a  chronic appearing right occipital pole infarct; the level of occlusion puts a much greater area at risk. 2. Cervical carotid atherosclerosis with proximal ICA stenosis of approximately 20% on the right and 50% on the left. Low-density thrombus or irregular plaque projects into the proximal left ICA lumen. 3. Diffuse intracranial atherosclerosis. Electronically Signed   By: Marnee Spring M.D.   On: 11/04/2015 10:45   Dg Chest 2 View  Result Date: 11/04/2015 CLINICAL DATA:  TIA, left-sided weakness EXAM: CHEST  2 VIEW COMPARISON:  None. FINDINGS: Cardiomediastinal silhouette is unremarkable. No acute infiltrate or pleural effusion. No pulmonary edema. Atherosclerotic calcifications of carotid siphon. Degenerative changes mid and lower thoracic spine. IMPRESSION: No active cardiopulmonary disease. Electronically Signed   By: Natasha Mead M.D.   On: 11/04/2015 14:30   Ct Head Wo Contrast  Result Date: 11/05/2015 CLINICAL DATA:  Recent stroke. Sudden onset worsening symptoms including left arm weakness. EXAM: CT HEAD WITHOUT CONTRAST TECHNIQUE: Contiguous axial images were obtained from the base of the skull through the  vertex without intravenous contrast. COMPARISON:  Head CT and brain MRI from yesterday FINDINGS: Brain: Right corona radiata and caudate body infarcts by MRI are subtle by CT. No evidence of infarct extension or cortical involvement. No hemorrhagic conversion. Remote right PCA territory infarct affecting the occipital pole. No hydrocephalus or shift. Vascular: No hyperdense vessel .  Atherosclerotic calcification Skull: Negative for fracture or focal lesion. Sinuses/Orbits: No acute finding. Other: None. IMPRESSION: 1. Right subcortical infarcts on previous brain MRI are subtle by CT. No hemorrhagic conversion or evidence of infarct progression. 2. Remote right occipital pole infarct. Electronically Signed   By: Marnee Spring M.D.   On: 11/05/2015 13:45   Ct Head Wo Contrast  Result Date: 11/04/2015 CLINICAL DATA:  Code stroke EXAM: CT HEAD WITHOUT CONTRAST TECHNIQUE: Contiguous axial images were obtained from the base of the skull through the vertex without intravenous contrast. COMPARISON:  04/09/2013 FINDINGS: No skull fracture is noted. Paranasal sinuses and mastoid air cells are unremarkable. No intracranial hemorrhage, mass effect or midline shift. Mild cerebral atrophy. Mild periventricular white matter decreased attenuation probable due to chronic small vessel ischemic changes. Bilateral basal ganglia punctate calcifications are again noted. There is interval old appearing infarct in right occipital lobe axial image 17 measures 1.6 cm. Axial image 15 there is small area of decreased attenuation in right occipital lobe. New ischemia cannot be excluded. Clinical correlation is necessary. Further correlation with MRI is recommended as clinically warranted. IMPRESSION: There is interval old appearing infarct in right occipital lobe axial image 17 measures 1.6 cm. Axial image 15 there is small area of decreased attenuation in right occipital lobe. New ischemia cannot be excluded. Clinical correlation is  necessary. Further correlation with MRI is recommended as clinically warranted. No intracranial hemorrhage, mass effect or midline shift. These results were called by telephone at the time of interpretation on 11/04/2015 at 9:01 am to Dr. Lacretia Nicks , who verbally acknowledged these results. Electronically Signed   By: Natasha Mead M.D.   On: 11/04/2015 09:02   Ct Angio Neck W And/or Wo Contrast  Result Date: 11/04/2015 CLINICAL DATA:  Left-sided numbness. EXAM: CT ANGIOGRAPHY HEAD AND NECK TECHNIQUE: Multidetector CT imaging of the head and neck was performed using the standard protocol during bolus administration of intravenous contrast. Multiplanar CT image reconstructions and MIPs were obtained to evaluate the vascular anatomy. Carotid stenosis measurements (when applicable) are obtained utilizing NASCET criteria, using the distal internal carotid diameter  as the denominator. CONTRAST:  75 cc Isovue 370 intravenous COMPARISON:  Noncontrast head CT from earlier today FINDINGS: CTA NECK Aortic arch: Atheromatous calcifications. No aneurysm or dissection. 3 vessel branching. Right carotid system: Mixed calcified and noncalcified plaque at the bifurcation with 20% maximal proximal ICA stenosis. Left carotid system: Atherosclerotic calcification primarily at the common carotid bifurcation which is mixed calcified and low-density. Stenosis measures up to 50% when measured coronally at the ICA origin. There is a low-density luminal filling defect along the posterior and lateral walls which appears to project into the lumen, as seen with thrombus. No evidence of dissection. Vertebral arteries:No proximal subclavian artery stenosis. Smooth and widely patent vertebral arteries with mild dominance on the left. Skeleton: No acute or aggressive process. Cervical disc and facet degeneration. Other neck: No incidental mass or adenopathy. Upper chest: Clear apical lungs CTA HEAD Anterior circulation: Carotid siphon  atherosclerotic luminal irregularity and calcification. Mild to moderate right mid cavernous segment narrowing. No major branch occlusion. There is diffuse atherosclerotic irregularity of medium size vessels. Negative for aneurysm. Posterior circulation: Mild left vertebral artery dominance. Standard vertebrobasilar branching. There is a critical right P1 segment stenosis with occluded PCA near the P1 2 junction (no post communicating artery). Negative for aneurysm. Venous sinuses: Patent Anatomic variants: Anterior communicating artery. No visible posterior communicating arteries. Delayed phase: Remote appearing right occipital pole infarct. As noted previously there may be more recent marginal ischemia. No visible thalamic infarct. Likely chronic pontomidbrain junction lacune. These results were called by telephone at the time of interpretation on 11/04/2015 at 10:36 am to Dr. Lacretia NicksBRIAN QUIGLEY , who verbally acknowledged these results. IMPRESSION: 1. Proximal right PCA occlusion. There is a chronic appearing right occipital pole infarct; the level of occlusion puts a much greater area at risk. 2. Cervical carotid atherosclerosis with proximal ICA stenosis of approximately 20% on the right and 50% on the left. Low-density thrombus or irregular plaque projects into the proximal left ICA lumen. 3. Diffuse intracranial atherosclerosis. Electronically Signed   By: Marnee SpringJonathon  Watts M.D.   On: 11/04/2015 10:45   Mr Brain Wo Contrast  Result Date: 11/04/2015 CLINICAL DATA:  Numbness in the left face and left upper extremity. Left leg weakness. Symptoms have improved. Hypertensive. EXAM: MRI HEAD WITHOUT CONTRAST MRA HEAD WITHOUT CONTRAST TECHNIQUE: Multiplanar, multiecho pulse sequences of the brain and surrounding structures were obtained without intravenous contrast. Angiographic images of the head were obtained using MRA technique without contrast. COMPARISON:  Head CT and CTA earlier today FINDINGS: MRI HEAD FINDINGS  There are small acute right lateral lenticulostriate territory infarcts involving the posterior lentiform nucleus, corona radiata, and caudate body. There is a chronic infarct in the right central pons with associated chronic blood products resulting and mild artifact on diffusion imaging. There is a small chronic right occipital cortical infarct with associated chronic blood products. No mass, midline shift, or extra-axial fluid collection is seen. Cerebral atrophy is within normal limits for age. Small foci of T2 hyperintensity scattered in the cerebral white matter are nonspecific but compatible with minimal chronic small vessel ischemic disease. A dilated perivascular space is noted inferiorly in the right basal ganglia. Orbits are unremarkable. Paranasal sinuses and mastoid air cells are clear. Major intracranial vascular flow voids are preserved. No focal osseous lesion is identified. MRA HEAD FINDINGS The visualized distal vertebral arteries are patent to the basilar with the left being dominant. PICA origins, left AICA origin, and SCA origins are patent. A right AICA is not  clearly identified. The basilar artery is widely patent. The left PCA is patent without evidence of significant proximal stenosis. A severe right P1 stenosis is again seen with occlusion of the right PCA near the P1 - P2 junction. The internal carotid arteries are patent from skullbase to carotid termini. There is atherosclerotic carotid siphon irregularity bilaterally. There is mild right supraclinoid ICA stenosis. There is a 1.5 mm medial outpouching from the proximal left supraclinoid ICA in the superior hypophyseal region compatible with a tiny aneurysm. ACAs and MCAs are patent without evidence of major branch occlusion or significant proximal stenosis. IMPRESSION: 1. Small acute right basal ganglia region infarcts. 2. Chronic infarcts in the right occipital lobe and pons. 3. Proximal right PCA occlusion as described on earlier CTA.  4. No major branch occlusion or flow limiting proximal stenosis in the anterior circulation. Mild right supraclinoid ICA stenosis. 5. 1.5 mm left supraclinoid ICA aneurysm. Electronically Signed   By: Sebastian Ache M.D.   On: 11/04/2015 21:16   Mr Maxine Glenn Head/brain UJ Cm  Result Date: 11/04/2015 CLINICAL DATA:  Numbness in the left face and left upper extremity. Left leg weakness. Symptoms have improved. Hypertensive. EXAM: MRI HEAD WITHOUT CONTRAST MRA HEAD WITHOUT CONTRAST TECHNIQUE: Multiplanar, multiecho pulse sequences of the brain and surrounding structures were obtained without intravenous contrast. Angiographic images of the head were obtained using MRA technique without contrast. COMPARISON:  Head CT and CTA earlier today FINDINGS: MRI HEAD FINDINGS There are small acute right lateral lenticulostriate territory infarcts involving the posterior lentiform nucleus, corona radiata, and caudate body. There is a chronic infarct in the right central pons with associated chronic blood products resulting and mild artifact on diffusion imaging. There is a small chronic right occipital cortical infarct with associated chronic blood products. No mass, midline shift, or extra-axial fluid collection is seen. Cerebral atrophy is within normal limits for age. Small foci of T2 hyperintensity scattered in the cerebral white matter are nonspecific but compatible with minimal chronic small vessel ischemic disease. A dilated perivascular space is noted inferiorly in the right basal ganglia. Orbits are unremarkable. Paranasal sinuses and mastoid air cells are clear. Major intracranial vascular flow voids are preserved. No focal osseous lesion is identified. MRA HEAD FINDINGS The visualized distal vertebral arteries are patent to the basilar with the left being dominant. PICA origins, left AICA origin, and SCA origins are patent. A right AICA is not clearly identified. The basilar artery is widely patent. The left PCA is patent  without evidence of significant proximal stenosis. A severe right P1 stenosis is again seen with occlusion of the right PCA near the P1 - P2 junction. The internal carotid arteries are patent from skullbase to carotid termini. There is atherosclerotic carotid siphon irregularity bilaterally. There is mild right supraclinoid ICA stenosis. There is a 1.5 mm medial outpouching from the proximal left supraclinoid ICA in the superior hypophyseal region compatible with a tiny aneurysm. ACAs and MCAs are patent without evidence of major branch occlusion or significant proximal stenosis. IMPRESSION: 1. Small acute right basal ganglia region infarcts. 2. Chronic infarcts in the right occipital lobe and pons. 3. Proximal right PCA occlusion as described on earlier CTA. 4. No major branch occlusion or flow limiting proximal stenosis in the anterior circulation. Mild right supraclinoid ICA stenosis. 5. 1.5 mm left supraclinoid ICA aneurysm. Electronically Signed   By: Sebastian Ache M.D.   On: 11/04/2015 21:16    Assessment/Plan:   1. Right basal ganglia embolic stroke (HCC)  Continue home health PT/OT Pain control per OTC meds No RX for narcotics needed D/C to son's home in [place] FU with opthamologist and neurologist asap  Patient is being discharged with the following home health services:  PT/OT  Patient is being discharged with the following durable medical equipment:  Levan Hurst  Patient has been advised to f/u with their PCP in 1-2 weeks to bring them up to date on their rehab stay.  Social services at facility was responsible for arranging this appointment.  Pt was provided with a 30 day supply of prescriptions for medications and refills must be obtained from their PCP.  For controlled substances, a more limited supply may be provided adequate until PCP appointment only.  Future labs/tests needed:  Per pcp  Family/ staff Communication:   Total Time:  Documentation:  Face to  Face:  Family/Phone:  Brynda Rim, NP-C Geriatrics [facility] [address], [place] [address] Cell Phone (Mon-Fri 8am-5pm):  [phone] On Call:  [phone] & follow prompts after 5pm & weekends Office Phone:  [phone] Office Fax:  [phone]

## 2015-12-09 DIAGNOSIS — Z8673 Personal history of transient ischemic attack (TIA), and cerebral infarction without residual deficits: Secondary | ICD-10-CM | POA: Insufficient documentation

## 2016-01-17 ENCOUNTER — Encounter: Payer: Self-pay | Admitting: Neurology

## 2016-01-17 ENCOUNTER — Ambulatory Visit (INDEPENDENT_AMBULATORY_CARE_PROVIDER_SITE_OTHER): Payer: Medicare Other | Admitting: Neurology

## 2016-01-17 VITALS — BP 137/65 | HR 60 | Ht 65.0 in | Wt 179.0 lb

## 2016-01-17 DIAGNOSIS — R200 Anesthesia of skin: Secondary | ICD-10-CM

## 2016-01-17 DIAGNOSIS — I6381 Other cerebral infarction due to occlusion or stenosis of small artery: Secondary | ICD-10-CM

## 2016-01-17 DIAGNOSIS — R202 Paresthesia of skin: Secondary | ICD-10-CM | POA: Diagnosis not present

## 2016-01-17 DIAGNOSIS — I639 Cerebral infarction, unspecified: Secondary | ICD-10-CM | POA: Diagnosis not present

## 2016-01-17 NOTE — Patient Instructions (Addendum)
Stroke Prevention Some medical conditions and behaviors are associated with an increased chance of having a stroke. You may prevent a stroke by making healthy choices and managing medical conditions. HOW CAN I REDUCE MY RISK OF HAVING A STROKE?   Stay physically active. Get at least 30 minutes of activity on most or all days.  Do not smoke. It may also be helpful to avoid exposure to secondhand smoke.  Limit alcohol use. Moderate alcohol use is considered to be:  No more than 2 drinks per day for men.  No more than 1 drink per day for nonpregnant women.  Eat healthy foods. This involves:  Eating 5 or more servings of fruits and vegetables a day.  Making dietary changes that address high blood pressure (hypertension), high cholesterol, diabetes, or obesity.  Manage your cholesterol levels.  Making food choices that are high in fiber and low in saturated fat, trans fat, and cholesterol may control cholesterol levels.  Take any prescribed medicines to control cholesterol as directed by your health care provider.  Manage your diabetes.  Controlling your carbohydrate and sugar intake is recommended to manage diabetes.  Take any prescribed medicines to control diabetes as directed by your health care provider.  Control your hypertension.  Making food choices that are low in salt (sodium), saturated fat, trans fat, and cholesterol is recommended to manage hypertension.  Ask your health care provider if you need treatment to lower your blood pressure. Take any prescribed medicines to control hypertension as directed by your health care provider.  If you are 18-39 years of age, have your blood pressure checked every 3-5 years. If you are 40 years of age or older, have your blood pressure checked every year.  Maintain a healthy weight.  Reducing calorie intake and making food choices that are low in sodium, saturated fat, trans fat, and cholesterol are recommended to manage  weight.  Stop drug abuse.  Avoid taking birth control pills.  Talk to your health care provider about the risks of taking birth control pills if you are over 35 years old, smoke, get migraines, or have ever had a blood clot.  Get evaluated for sleep disorders (sleep apnea).  Talk to your health care provider about getting a sleep evaluation if you snore a lot or have excessive sleepiness.  Take medicines only as directed by your health care provider.  For some people, aspirin or blood thinners (anticoagulants) are helpful in reducing the risk of forming abnormal blood clots that can lead to stroke. If you have the irregular heart rhythm of atrial fibrillation, you should be on a blood thinner unless there is a good reason you cannot take them.  Understand all your medicine instructions.  Make sure that other conditions (such as anemia or atherosclerosis) are addressed. SEEK IMMEDIATE MEDICAL CARE IF:   You have sudden weakness or numbness of the face, arm, or leg, especially on one side of the body.  Your face or eyelid droops to one side.  You have sudden confusion.  You have trouble speaking (aphasia) or understanding.  You have sudden trouble seeing in one or both eyes.  You have sudden trouble walking.  You have dizziness.  You have a loss of balance or coordination.  You have a sudden, severe headache with no known cause.  You have new chest pain or an irregular heartbeat. Any of these symptoms may represent a serious problem that is an emergency. Do not wait to see if the symptoms will   go away. Get medical help at once. Call your local emergency services (911 in [place] Do not drive yourself to the hospital.   This information is not intended to replace advice given to you by your health care provider. Make sure you discuss any questions you have with your health care provider.   Document Released: 04/20/2004 Document Revised: 04/03/2014 Document Reviewed:  09/13/2012 Elsevier Interactive Patient Education 2016 Elsevier Inc.  

## 2016-01-17 NOTE — Progress Notes (Signed)
[facility] [address] [place]. [place] [address] [phone]       OFFICE FOLLOW UP VISIT NOTE  Margaret. Margaret Mcintosh Date of Birth:  05-04-1932 Medical Record Number:  [id number]      HPI: Margaret Mcintosh is a 78 year pleasant lady seen today for first office f/u visit following hospital admission for stroke in august 2017.Margaret S Russellis an 82 y.o.femalewho was normal last night at 2300 hours (LKW 11/04/2015 at 0000). She went to bed and awoke this AM at 0430 and noted she was off balance and listing to the left. She staggered back to bed and awoke at 0700 hours. Symptoms of off balance did not resolve and she felt herleft armwas very weak. She called EMS and was brought to [facility]. There a MD called [facility] here at [facility]. There was confusion as IR stated the chronic PCA was nothing they would intervene on and if they felt it was a wake up stroke she would need to be seen by a neurohospitalist to evaluate for wake up stroke. For some unknown reason Neurohospitalist at [place] ( Dr. Loretha Brasil) was not consulted and she was transferred to [facility]. ON arrival she had no symptoms and NIHSS of 0. Patient was not administered IV t-PA secondary to admitting outside of the window. She was admitted for further evaluation and treatment.MRI scan showed small acute ight basal ganglia infarct and subacute right occipital and Pontine infarcts as well.She had neurological fluctuation of her left hemiparesis for few days. 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 She did not fall and denies back injury or  radicular pain.She states her BP is good and is 137/65 today. She is tolerating lipitor well and staes her fasting sugars are good on Metformin.  ROS:   14 system review of systems is positive for  Drooling,light sensitivity,blurred vision,leg swelling,frequent waking,back pain and all other systems negative  PMH:  Past Medical History:  Diagnosis Date  . Arthritis   . Cataract   . Complication of anesthesia    "takes a long time to wake me up" (11/04/2015)  . Diabetes mellitus (HCC)   . GERD (gastroesophageal reflux disease)   . Glaucoma   . Hypertension   . Stroke [facility])     Social History:  Social History   Social History  . Marital status: Widowed    Spouse name: N/A  . Number of children: N/A  . Years of education: N/A   Occupational History  . Not on file.   Social History Main Topics  . Smoking status: Never Smoker  . Smokeless tobacco: Never Used  . Alcohol use No  . Drug use: No  . Sexual activity: No   Other Topics Concern  . Not on file   Social History Narrative  . No narrative on file    Medications:   Current Outpatient Prescriptions on File Prior to Visit  Medication Sig Dispense Refill  . acetaminophen (TYLENOL) 325 MG tablet Take 325-650 mg by mouth every 6 (six) hours as needed for mild pain.    Marland Kitchen amLODipine (NORVASC) 5  MG tablet Take 1 tablet (5 mg total) by mouth daily. 30 tablet 0  . aspirin EC 325 MG EC tablet Take 1 tablet (325 mg total) by mouth daily. 30 tablet 0  . labetalol (NORMODYNE) 100 MG tablet Take 1 tablet (100 mg total) by mouth 3 (three) times daily. 90 tablet 0  . nystatin (MYCOSTATIN/NYSTOP) powder Apply topically 2 (two) times daily. (Patient taking differently: Apply topically 2 (two) times daily as needed. ) 15 g 0  . pantoprazole (PROTONIX) 40 MG tablet Take 1 tablet (40 mg total) by mouth daily at 6 (six) AM. (Patient taking differently: Take 40 mg by mouth as needed. ) 30 tablet 0  . simvastatin (ZOCOR) 20 MG tablet Take  1 tablet (20 mg total) by mouth daily at 6 PM. 30 tablet 0   No current facility-administered medications on file prior to visit.     Allergies:   Allergies  Allergen Reactions  . Codeine Other (See Comments)    Gets nausea and feels like"something is crawling"  . Diphenhydramine Hcl Other (See Comments)    Feels like skin crawling  . Other Other (See Comments)    CANNOT HAVE ANY "SPICY" FOODS!!    Physical Exam General: well developed, well nourished, seated, in no evident distress Head: head normocephalic and atraumatic.   Neck: supple with no carotid or supraclavicular bruits Cardiovascular: regular rate and rhythm, no murmurs Musculoskeletal: no deformity Skin:  no rash/petichiae Vascular:  Normal pulses all extremities  Neurologic Exam Mental Status: Awake and fully alert. Oriented to place and time. Recent and remote memory intact. Attention span, concentration and fund of knowledge appropriate. Mood and affect appropriate.  Cranial Nerves: Fundoscopic exam reveals sharp disc margins. Pupils equal, briskly reactive to light. Extraocular movements full without nystagmus. Visual fields full to confrontation. Hearing intact. Facial sensation intact. Face, tongue, palate moves normally and symmetrically.  Motor: Normal bulk and tone. Normal strength in all tested extremity muscles on right side with mild left hemiparesis 4/5 with left grip weakness.increased tone left leg.. Sensory.: intact to touch , pinprick , position and vibratory sensation. Tinel`s sign negative right fibular head. Coordination: Rapid alternating movements normal in all extremities. Finger-to-nose and heel-to-shin performed accurately bilaterally. Gait and Station: Arises from chair without difficulty. Stance is normal. Gait demonstrates normal stride length and balance . Unable to heel, toe and tandem walk without difficulty.  Reflexes: 1+ and symmetric. Toes downgoing.   NIHSS 1 Modified Rankin   2   ASSESSMENT: 83 year patient with right basal ganglia infarct in August 2017 from small vessel disease.Vascular risk factors of Ht and Hyperlipidimia.Right leg paresthesias of unclear etiology    PLAN: I had a long d/w patient and her son about her recent lacunar stroke, risk for recurrent stroke/TIAs, personally independently reviewed imaging studies and stroke evaluation results and answered questions.Continue aspirin 325 mg daily  for secondary stroke prevention and maintain strict control of hypertension with blood pressure goal below 130/90, diabetes with hemoglobin A1c goal below 6.5% and lipids with LDL cholesterol goal below 70 mg/dL. I also advised the patient to eat a healthy diet with plenty of whole grains, cereals, fruits and vegetables, exercise regularly and maintain ideal body weight. Plan to check no conduction EMG study to evaluate her right leg tingling. Patient may have eye surgery if she wants as long as she does not have to hold aspirin for the procedure. Greater than 50% time during this 30 minute visit was spent on counselling and  coordination of care about stroke risk, prevention and treatment..Followup in the future with my nurse practitioner in 6 months or call earlier if necessary Delia HeadyPramod Mariaeduarda Defranco, MD  [facility] [address] [place], [place] [id number]  Phone [phone] Fax [phone] Note: This document was prepared with digital dictation and possible smart phrase technology. Any transcriptional errors that result from this process are unintentional.

## 2016-01-18 ENCOUNTER — Ambulatory Visit (INDEPENDENT_AMBULATORY_CARE_PROVIDER_SITE_OTHER): Payer: Self-pay | Admitting: Neurology

## 2016-01-18 ENCOUNTER — Ambulatory Visit (INDEPENDENT_AMBULATORY_CARE_PROVIDER_SITE_OTHER): Payer: Medicare Other | Admitting: Neurology

## 2016-01-18 ENCOUNTER — Encounter: Payer: Self-pay | Admitting: Neurology

## 2016-01-18 DIAGNOSIS — R202 Paresthesia of skin: Secondary | ICD-10-CM

## 2016-01-18 DIAGNOSIS — R1032 Left lower quadrant pain: Secondary | ICD-10-CM

## 2016-01-18 DIAGNOSIS — R200 Anesthesia of skin: Secondary | ICD-10-CM | POA: Diagnosis not present

## 2016-01-18 NOTE — Procedures (Signed)
     HISTORY:  Margaret Mcintosh is an 82 year old patient with a one-week history of paresthesias involving the right lower extremity. The patient will have symptoms around the ankle area that come on with standing, go away with sitting or lying down. She does note some low back pain at times. The patient is being evaluated for these new symptoms.  NERVE CONDUCTION STUDIES:  Nerve conduction studies were performed on both lower extremities. The distal motor latencies and motor amplitudes for the peroneal and posterior tibial nerves were within normal limits. The nerve conduction velocities for these nerves were also normal. The H reflex latencies were normal. The sensory latencies for the peroneal nerves were within normal limits.   EMG STUDIES:  EMG study was performed on the right lower extremity:  The tibialis anterior muscle reveals 2 to 4K motor units with full recruitment. No fibrillations or positive waves were seen. The peroneus tertius muscle reveals 2 to 4K motor units with full recruitment. No fibrillations or positive waves were seen. The medial gastrocnemius muscle reveals 1 to 3K motor units with full recruitment. No fibrillations or positive waves were seen. The vastus lateralis muscle reveals 2 to 4K motor units with full recruitment. No fibrillations or positive waves were seen. The iliopsoas muscle reveals 2 to 4K motor units with full recruitment. No fibrillations or positive waves were seen. The biceps femoris muscle (long head) reveals 2 to 4K motor units with full recruitment. No fibrillations or positive waves were seen. The lumbosacral paraspinal muscles were tested at 3 levels, and revealed no abnormalities of insertional activity at all 3 levels tested. There was good relaxation.   IMPRESSION:  Nerve conduction studies done on both lower extremities were within normal limits. No evidence of a peripheral neuropathy is seen. EMG evaluation of the right lower  extremity was unremarkable, without evidence of an overlying lumbosacral radiculopathy.  Marlan Palau. Keith Maninder Deboer MD 01/18/2016 1:29 PM  [facility] [address] [place], [place] [id number]  Phone [phone] Fax [phone]

## 2016-01-18 NOTE — Progress Notes (Signed)
Please refer to EMG and NCV procedure note. 

## 2016-01-19 ENCOUNTER — Telehealth: Payer: Self-pay

## 2016-01-19 NOTE — Telephone Encounter (Signed)
Rn call patient that her EMG was normal .PT verbalized understanding.

## 2016-01-19 NOTE — Telephone Encounter (Signed)
-----   Message from Micki RileyPramod S Sethi, MD sent at 01/18/2016  5:49 PM EDT ----- Joneen RoachKindly inform the patient that EMG nerve conduction study was normal

## 2016-02-01 ENCOUNTER — Encounter: Payer: Self-pay | Admitting: *Deleted

## 2016-02-01 NOTE — Discharge Instructions (Signed)

## 2016-02-08 ENCOUNTER — Ambulatory Visit: Payer: Medicare Other | Admitting: Anesthesiology

## 2016-02-08 ENCOUNTER — Encounter
Admission: RE | Disposition: A | Discharge status: Home / Self Care | Payer: Self-pay | Source: Ambulatory Visit | Attending: Ophthalmology

## 2016-02-08 ENCOUNTER — Ambulatory Visit
Admission: RE | Admit: 2016-02-08 | Discharge: 2016-02-08 | Disposition: A | Discharge status: Home / Self Care | Payer: Medicare Other | Source: Ambulatory Visit | Attending: Ophthalmology | Admitting: Ophthalmology

## 2016-02-08 DIAGNOSIS — K219 Gastro-esophageal reflux disease without esophagitis: Secondary | ICD-10-CM | POA: Insufficient documentation

## 2016-02-08 DIAGNOSIS — Z79899 Other long term (current) drug therapy: Secondary | ICD-10-CM | POA: Diagnosis not present

## 2016-02-08 DIAGNOSIS — I10 Essential (primary) hypertension: Secondary | ICD-10-CM | POA: Diagnosis not present

## 2016-02-08 DIAGNOSIS — E1136 Type 2 diabetes mellitus with diabetic cataract: Secondary | ICD-10-CM | POA: Diagnosis not present

## 2016-02-08 HISTORY — PX: CATARACT EXTRACTION W/PHACO: SHX586

## 2016-02-08 LAB — GLUCOSE, CAPILLARY
Glucose-Capillary: 134 mg/dL — ABNORMAL HIGH (ref 65–99)
Glucose-Capillary: 132 mg/dL — ABNORMAL HIGH (ref 65–99)

## 2016-02-08 SURGERY — PHACOEMULSIFICATION, CATARACT, WITH IOL INSERTION
Anesthesia: Monitor Anesthesia Care | Site: Eye | Laterality: Right | Wound class: Clean

## 2016-02-08 MED ORDER — SODIUM HYALURONATE 10 MG/ML IO SOLN
INTRAOCULAR | Status: DC | PRN
  Administered 2016-02-08: 0.85 mL via INTRAOCULAR

## 2016-02-08 MED ORDER — BSS IO SOLN
INTRAOCULAR | Status: DC | PRN
  Administered 2016-02-08: .5 mL via OPHTHALMIC

## 2016-02-08 MED ORDER — ACETAMINOPHEN 160 MG/5ML PO SOLN: 325.0000 mg | ORAL | Status: DC | PRN

## 2016-02-08 MED ORDER — MIDAZOLAM HCL 2 MG/2ML IJ SOLN
INTRAMUSCULAR | Status: DC | PRN
  Administered 2016-02-08: 2 mg via INTRAVENOUS

## 2016-02-08 MED ORDER — EPINEPHRINE PF 1 MG/ML IJ SOLN
INTRAOCULAR | Status: DC | PRN
  Administered 2016-02-08: 126 mL via OPHTHALMIC

## 2016-02-08 MED ORDER — SODIUM HYALURONATE 23 MG/ML IO SOLN
INTRAOCULAR | Status: DC | PRN
  Administered 2016-02-08: 0.6 mL via INTRAOCULAR

## 2016-02-08 MED ORDER — FENTANYL CITRATE (PF) 100 MCG/2ML IJ SOLN
INTRAMUSCULAR | Status: DC | PRN
  Administered 2016-02-08: 50 ug via INTRAVENOUS

## 2016-02-08 MED ORDER — LACTATED RINGERS IV SOLN: INTRAVENOUS | Status: DC

## 2016-02-08 MED ORDER — ACETAMINOPHEN 325 MG PO TABS: 325.0000 mg | ORAL_TABLET | ORAL | Status: DC | PRN

## 2016-02-08 MED ORDER — MOXIFLOXACIN HCL 0.5 % OP SOLN
OPHTHALMIC | Status: DC | PRN
  Administered 2016-02-08: 1 [drp] via OPHTHALMIC

## 2016-02-08 MED ORDER — ARMC OPHTHALMIC DILATING DROPS
1.0000 "application " | OPHTHALMIC | Status: DC | PRN
  Administered 2016-02-08 (×2): 1 via OPHTHALMIC

## 2016-02-08 SURGICAL SUPPLY — 21 items
CANNULA ANT/CHMB 27GA (MISCELLANEOUS) ×3 IMPLANT
CUP MEDICINE 2OZ PLAST GRAD ST (MISCELLANEOUS) ×3 IMPLANT
DISSECTOR HYDRO NUCLEUS 50X22 (MISCELLANEOUS) ×3 IMPLANT
GLOVE BIO SURGEON STRL SZ8 (GLOVE) ×3 IMPLANT
GLOVE SURG LX 7.5 STRW (GLOVE) ×2
GLOVE SURG LX STRL 7.5 STRW (GLOVE) ×1 IMPLANT
GOWN STRL REUS W/ TWL LRG LVL3 (GOWN DISPOSABLE) ×2 IMPLANT
GOWN STRL REUS W/TWL LRG LVL3 (GOWN DISPOSABLE) ×4
LENS IOL TECNIS ITEC 18.5 (Intraocular Lens) ×3 IMPLANT
MARKER SKIN DUAL TIP RULER LAB (MISCELLANEOUS) ×3 IMPLANT
PACK CATARACT (MISCELLANEOUS) ×3 IMPLANT
PACK CATARACT BRASINGTON (MISCELLANEOUS) ×3 IMPLANT
PACK EYE AFTER SURG (MISCELLANEOUS) ×3 IMPLANT
SOL PREP PVP 2OZ (MISCELLANEOUS) ×3
SOLUTION PREP PVP 2OZ (MISCELLANEOUS) ×1 IMPLANT
SUT ETHILON 10-0 CS-B-6CS-B-6 (SUTURE) ×3
SUTURE EHLN 10-0 CS-B-6CS-B-6 (SUTURE) ×1 IMPLANT
SYR 3ML LL SCALE MARK (SYRINGE) ×3 IMPLANT
SYR TB 1ML LUER SLIP (SYRINGE) ×3 IMPLANT
WATER STERILE IRR 250ML POUR (IV SOLUTION) ×3 IMPLANT
WIPE NON LINTING 3.25X3.25 (MISCELLANEOUS) ×3 IMPLANT

## 2016-02-08 NOTE — Anesthesia Postprocedure Evaluation (Signed)
Anesthesia Post Note  Patient: Margaret LawlessShelvia S Mcintosh  Procedure(s) Performed: Procedure(s) (LRB): CATARACT EXTRACTION PHACO AND INTRAOCULAR LENS PLACEMENT (IOC) (Right)  Patient location during evaluation: PACU Anesthesia Type: MAC Level of consciousness: awake and alert and oriented Pain management: satisfactory to patient Vital Signs Assessment: post-procedure vital signs reviewed and stable Respiratory status: spontaneous breathing, nonlabored ventilation and respiratory function stable Cardiovascular status: blood pressure returned to baseline and stable Postop Assessment: Adequate PO intake and No signs of nausea or vomiting Anesthetic complications: no    Cherly BeachStella, Latifah Padin J

## 2016-02-08 NOTE — Transfer of Care (Signed)
Immediate Anesthesia Transfer of Care Note  Patient: Margaret LawlessShelvia S Mcintosh  Procedure(s) Performed: Procedure(s) with comments: CATARACT EXTRACTION PHACO AND INTRAOCULAR LENS PLACEMENT (IOC) (Right) - DIABETIC - oral meds RIGHT  Patient Location: PACU  Anesthesia Type: MAC  Level of Consciousness: awake, alert  and patient cooperative  Airway and Oxygen Therapy: Patient Spontanous Breathing and Patient connected to supplemental oxygen  Post-op Assessment: Post-op Vital signs reviewed, Patient's Cardiovascular Status Stable, Respiratory Function Stable, Patent Airway and No signs of Nausea or vomiting  Post-op Vital Signs: Reviewed and stable  Complications: No apparent anesthesia complications

## 2016-02-08 NOTE — H&P (Signed)
The History and Physical notes are on paper, have been signed, and are to be scanned. The patient remains stable and unchanged from the H&P.   Previous H&P reviewed, patient examined, and there are no changes.  Margaret Mcintosh 02/08/2016 8:03 AM

## 2016-02-08 NOTE — Anesthesia Preprocedure Evaluation (Signed)
Anesthesia Evaluation  Patient identified by MRN, date of birth, ID band Patient awake    Reviewed: Allergy & Precautions, H&P , NPO status , Patient's Chart, lab work & pertinent test results  Airway Mallampati: II  TM Distance: >3 FB Neck ROM: full    Dental no notable dental hx.    Pulmonary    Pulmonary exam normal        Cardiovascular hypertension, Normal cardiovascular exam     Neuro/Psych TIACVA    GI/Hepatic GERD  ,  Endo/Other  diabetes  Renal/GU      Musculoskeletal   Abdominal   Peds  Hematology   Anesthesia Other Findings   Reproductive/Obstetrics                             Anesthesia Physical Anesthesia Plan  ASA: III  Anesthesia Plan: MAC   Post-op Pain Management:    Induction:   Airway Management Planned:   Additional Equipment:   Intra-op Plan:   Post-operative Plan:   Informed Consent: I have reviewed the patients History and Physical, chart, labs and discussed the procedure including the risks, benefits and alternatives for the proposed anesthesia with the patient or authorized representative who has indicated his/her understanding and acceptance.     Plan Discussed with:   Anesthesia Plan Comments:         Anesthesia Quick Evaluation

## 2016-02-08 NOTE — Op Note (Signed)
OPERATIVE NOTE  [name] [id number] 02/08/2016   PREOPERATIVE DIAGNOSIS:  Nuclear sclerotic cataract right eye.  H25.11   POSTOPERATIVE DIAGNOSIS:    Nuclear sclerotic cataract right eye.     PROCEDURE:  Phacoemusification with posterior chamber intraocular lens placement of the right eye   LENS:   Implant Name Type Inv. Item Serial No. Manufacturer Lot No. LRB No. Used  LENS IOL DIOP 18.5 - [id number] Intraocular Lens LENS IOL DIOP 18.5 [id number]   Right 1       PCB00 +18.5   ULTRASOUND TIME: 0 minutes 49 seconds.  CDE 5.95   SURGEON:  Willey BladeBradley King, MD, MPH  ANESTHESIOLOGIST: Anesthesiologist: Ranee GosselinMichael Stella, MD CRNA: Andee PolesWendy Bush, CRNA   ANESTHESIA:  Topical with tetracaine drops augmented with 1% preservative-free intracameral lidocaine.  ESTIMATED BLOOD LOSS: less than 1 mL.   COMPLICATIONS:  None.   DESCRIPTION OF PROCEDURE:  The patient was identified in the holding room and transported to the operating room and placed in the supine position under the operating microscope.  The right eye was identified as the operative eye and it was prepped and draped in the usual sterile ophthalmic fashion.   A 1.0 millimeter clear-corneal paracentesis was made at the 10:30 position. 0.5 ml of preservative-free 1% lidocaine with epinephrine was injected into the anterior chamber.  The anterior chamber was filled with Healon 5 viscoelastic.  A 2.4 millimeter keratome was used to make a near-clear corneal incision at the 8:00 position.  A curvilinear capsulorrhexis was made with a cystotome and capsulorrhexis forceps.  Balanced salt solution was used to hydrodissect and hydrodelineate the nucleus.   Phacoemulsification was then used in stop and chop fashion to remove the lens nucleus and epinucleus.  The remaining cortex was then removed using the irrigation and aspiration handpiece. Healon was then placed into the capsular bag to distend it for lens placement.  A lens was then  injected into the capsular bag.  The remaining viscoelastic was aspirated.   Wounds were hydrated with balanced salt solution.  The anterior chamber was inflated to a physiologic pressure with balanced salt solution.    Intracameral vigamox 0.1 mL undiluted was injected into the eye.  Initially there was a small wound leak from the primary incision, so a 10-0 nylon suture was placed.  Then no wound leaks were noted.  The patient was taken to the recovery room in stable condition without complications of anesthesia or surgery  Willey BladeBradley King 02/08/2016, 8:52 AM

## 2016-02-08 NOTE — Anesthesia Procedure Notes (Signed)
Procedure Name: MAC Performed by: Dorine Duffey Pre-anesthesia Checklist: Patient identified, Emergency Drugs available, Suction available, Timeout performed and Patient being monitored Patient Re-evaluated:Patient Re-evaluated prior to inductionOxygen Delivery Method: Nasal cannula Placement Confirmation: positive ETCO2     

## 2016-02-09 ENCOUNTER — Encounter: Payer: Self-pay | Admitting: Ophthalmology

## 2016-03-01 ENCOUNTER — Encounter: Payer: Self-pay | Admitting: *Deleted

## 2016-03-03 NOTE — Discharge Instructions (Signed)
Cataract Surgery, Care After °Refer to this sheet in the next few weeks. These instructions provide you with information about caring for yourself after your procedure. Your health care provider may also give you more specific instructions. Your treatment has been planned according to current medical practices, but problems sometimes occur. Call your health care provider if you have any problems or questions after your procedure. °What can I expect after the procedure? °After the procedure, it is common to have: °· Itching. °· Discomfort. °· Fluid discharge. °· Sensitivity to light and to touch. °· Bruising. °Follow these instructions at home: °Eye Care  °· Check your eye every day for signs of infection. Watch for: °¨ Redness, swelling, or pain. °¨ Fluid, blood, or pus. °¨ Warmth. °¨ Bad smell. °Activity  °· Avoid strenuous activities, such as playing contact sports, for as long as told by your health care provider. °· Do not drive or operate heavy machinery until your health care provider approves. °· Do not bend or lift heavy objects . Bending increases pressure in the eye. You can walk, climb stairs, and do light household chores. °· Ask your health care provider when you can return to work. If you work in a dusty environment, you may be advised to wear protective eyewear for a period of time. °General instructions  °· Take or apply over-the-counter and prescription medicines only as told by your health care provider. This includes eye drops. °· Do not touch or rub your eyes. °· If you were given a protective shield, wear it as told by your health care provider. If you were not given a protective shield, wear sunglasses as told by your health care provider to protect your eyes. °· Keep the area around your eye clean and dry. Avoid swimming or allowing water to hit you directly in the face while showering until told by your health care provider. Keep soap and shampoo out of your eyes. °· Do not put a contact lens  into the affected eye or eyes until your health care provider approves. °· Keep all follow-up visits as told by your health care provider. This is important. °Contact a health care provider if: ° °· You have increased bruising around your eye. °· You have pain that is not helped with medicine. °· You have a fever. °· You have redness, swelling, or pain in your eye. °· You have fluid, blood, or pus coming from your incision. °· Your vision gets worse. °Get help right away if: °· You have sudden vision loss. °This information is not intended to replace advice given to you by your health care provider. Make sure you discuss any questions you have with your health care provider. °Document Released: 09/30/2004 Document Revised: 07/22/2015 Document Reviewed: 01/21/2015 °Elsevier Interactive Patient Education © 2017 [employer] ° ° ° ° °General Anesthesia, Adult, Care After °These instructions provide you with information about caring for yourself after your procedure. Your health care provider may also give you more specific instructions. Your treatment has been planned according to current medical practices, but problems sometimes occur. Call your health care provider if you have any problems or questions after your procedure. °What can I expect after the procedure? °After the procedure, it is common to have: °· Vomiting. °· A sore throat. °· Mental slowness. °It is common to feel: °· Nauseous. °· Cold or shivery. °· Sleepy. °· Tired. °· Sore or achy, even in parts of your body where you did not have surgery. °Follow these instructions at   home: °For at least 24 hours after the procedure:  °· Do not: °¨ Participate in activities where you could fall or become injured. °¨ Drive. °¨ Use heavy machinery. °¨ Drink alcohol. °¨ Take sleeping pills or medicines that cause drowsiness. °¨ Make important decisions or sign legal documents. °¨ Take care of children on your own. °· Rest. °Eating and drinking  °· If you vomit, drink  water, juice, or soup when you can drink without vomiting. °· Drink enough fluid to keep your urine clear or pale yellow. °· Make sure you have little or no nausea before eating solid foods. °· Follow the diet recommended by your health care provider. °General instructions  °· Have a responsible adult stay with you until you are awake and alert. °· Return to your normal activities as told by your health care provider. Ask your health care provider what activities are safe for you. °· Take over-the-counter and prescription medicines only as told by your health care provider. °· If you smoke, do not smoke without supervision. °· Keep all follow-up visits as told by your health care provider. This is important. °Contact a health care provider if: °· You continue to have nausea or vomiting at home, and medicines are not helpful. °· You cannot drink fluids or start eating again. °· You cannot urinate after 8-12 hours. °· You develop a skin rash. °· You have fever. °· You have increasing redness at the site of your procedure. °Get help right away if: °· You have difficulty breathing. °· You have chest pain. °· You have unexpected bleeding. °· You feel that you are having a life-threatening or urgent problem. °This information is not intended to replace advice given to you by your health care provider. Make sure you discuss any questions you have with your health care provider. °Document Released: 06/19/2000 Document Revised: 08/16/2015 Document Reviewed: 02/25/2015 °Elsevier Interactive Patient Education © 2017 [employer] Inc[employer] ° °

## 2016-03-07 ENCOUNTER — Ambulatory Visit
Admission: RE | Admit: 2016-03-07 | Discharge: 2016-03-07 | Disposition: A | Discharge status: Home / Self Care | Payer: Medicare Other | Source: Ambulatory Visit | Attending: Ophthalmology | Admitting: Ophthalmology

## 2016-03-07 ENCOUNTER — Ambulatory Visit: Payer: Medicare Other | Admitting: Anesthesiology

## 2016-03-07 ENCOUNTER — Encounter
Admission: RE | Disposition: A | Discharge status: Home / Self Care | Payer: Self-pay | Source: Ambulatory Visit | Attending: Ophthalmology

## 2016-03-07 DIAGNOSIS — K219 Gastro-esophageal reflux disease without esophagitis: Secondary | ICD-10-CM | POA: Diagnosis not present

## 2016-03-07 DIAGNOSIS — I10 Essential (primary) hypertension: Secondary | ICD-10-CM | POA: Insufficient documentation

## 2016-03-07 DIAGNOSIS — E1136 Type 2 diabetes mellitus with diabetic cataract: Secondary | ICD-10-CM | POA: Insufficient documentation

## 2016-03-07 HISTORY — PX: CATARACT EXTRACTION W/PHACO: SHX586

## 2016-03-07 LAB — GLUCOSE, CAPILLARY
GLUCOSE-CAPILLARY: 151 mg/dL — AB (ref 65–99)
GLUCOSE-CAPILLARY: 126 mg/dL — AB (ref 65–99)

## 2016-03-07 SURGERY — PHACOEMULSIFICATION, CATARACT, WITH IOL INSERTION
Anesthesia: Monitor Anesthesia Care | Site: Eye | Laterality: Left | Wound class: Clean

## 2016-03-07 MED ORDER — FENTANYL CITRATE (PF) 100 MCG/2ML IJ SOLN
INTRAMUSCULAR | Status: DC | PRN
  Administered 2016-03-07: 50 ug via INTRAVENOUS

## 2016-03-07 MED ORDER — ACETAMINOPHEN 160 MG/5ML PO SOLN: 325.0000 mg | ORAL | Status: DC | PRN

## 2016-03-07 MED ORDER — EPINEPHRINE PF 1 MG/ML IJ SOLN
INTRAMUSCULAR | Status: DC | PRN
  Administered 2016-03-07: 89 mL via OPHTHALMIC

## 2016-03-07 MED ORDER — LIDOCAINE HCL (PF) 4 % IJ SOLN
INTRAMUSCULAR | Status: DC | PRN
  Administered 2016-03-07: 08:00:00 via OPHTHALMIC

## 2016-03-07 MED ORDER — LACTATED RINGERS IV SOLN: INTRAVENOUS | Status: DC

## 2016-03-07 MED ORDER — ARMC OPHTHALMIC DILATING DROPS
1.0000 | OPHTHALMIC | Status: DC | PRN
Start: 2016-03-07 — End: 2016-03-07
  Administered 2016-03-07 (×3): 1 via OPHTHALMIC

## 2016-03-07 MED ORDER — MIDAZOLAM HCL 2 MG/2ML IJ SOLN
INTRAMUSCULAR | Status: DC | PRN
  Administered 2016-03-07: 2 mg via INTRAVENOUS

## 2016-03-07 MED ORDER — SODIUM HYALURONATE 23 MG/ML IO SOLN
INTRAOCULAR | Status: DC | PRN
  Administered 2016-03-07: 0.6 mL via INTRAOCULAR

## 2016-03-07 MED ORDER — SODIUM HYALURONATE 10 MG/ML IO SOLN
INTRAOCULAR | Status: DC | PRN
  Administered 2016-03-07: 0.85 mL via INTRAOCULAR

## 2016-03-07 MED ORDER — ACETAMINOPHEN 325 MG PO TABS: 325.0000 mg | ORAL_TABLET | ORAL | Status: DC | PRN

## 2016-03-07 MED ORDER — MOXIFLOXACIN HCL 0.5 % OP SOLN: OPHTHALMIC | Status: DC | PRN

## 2016-03-07 MED ORDER — MOXIFLOXACIN HCL 0.5 % OP SOLN
OPHTHALMIC | Status: DC | PRN
  Administered 2016-03-07: 2 [drp] via OPHTHALMIC

## 2016-03-07 SURGICAL SUPPLY — 19 items
CANNULA ANT/CHMB 27GA (MISCELLANEOUS) ×3 IMPLANT
CUP MEDICINE 2OZ PLAST GRAD ST (MISCELLANEOUS) ×3 IMPLANT
DISSECTOR HYDRO NUCLEUS 50X22 (MISCELLANEOUS) ×3 IMPLANT
GLOVE BIO SURGEON STRL SZ8 (GLOVE) ×3 IMPLANT
GLOVE SURG LX 7.5 STRW (GLOVE) ×2
GLOVE SURG LX STRL 7.5 STRW (GLOVE) ×1 IMPLANT
GOWN STRL REUS W/ TWL LRG LVL3 (GOWN DISPOSABLE) ×2 IMPLANT
GOWN STRL REUS W/TWL LRG LVL3 (GOWN DISPOSABLE) ×4
LENS IOL TECNIS ITEC 19.5 (Intraocular Lens) ×3 IMPLANT
MARKER SKIN DUAL TIP RULER LAB (MISCELLANEOUS) ×3 IMPLANT
PACK CATARACT (MISCELLANEOUS) ×3 IMPLANT
PACK CATARACT BRASINGTON (MISCELLANEOUS) ×3 IMPLANT
PACK EYE AFTER SURG (MISCELLANEOUS) ×3 IMPLANT
SOL PREP PVP 2OZ (MISCELLANEOUS) ×3
SOLUTION PREP PVP 2OZ (MISCELLANEOUS) ×1 IMPLANT
SYR 3ML LL SCALE MARK (SYRINGE) ×3 IMPLANT
SYR TB 1ML LUER SLIP (SYRINGE) ×3 IMPLANT
WATER STERILE IRR 250ML POUR (IV SOLUTION) ×3 IMPLANT
WIPE NON LINTING 3.25X3.25 (MISCELLANEOUS) ×3 IMPLANT

## 2016-03-07 NOTE — Anesthesia Procedure Notes (Signed)
Procedure Name: MAC Performed by: Lavella Myren Pre-anesthesia Checklist: Patient identified, Emergency Drugs available, Suction available, Timeout performed and Patient being monitored Patient Re-evaluated:Patient Re-evaluated prior to inductionOxygen Delivery Method: Nasal cannula Placement Confirmation: positive ETCO2     

## 2016-03-07 NOTE — H&P (Signed)
The History and Physical notes are on paper, have been signed, and are to be scanned. The patient remains stable and unchanged from the H&P.   Previous H&P reviewed, patient examined, and there are no changes.  Margaret Mcintosh 03/07/2016 7:28 AM

## 2016-03-07 NOTE — Anesthesia Postprocedure Evaluation (Signed)
Anesthesia Post Note  Patient: Margaret LawlessShelvia S Mcintosh  Procedure(s) Performed: Procedure(s) (LRB): CATARACT EXTRACTION PHACO AND INTRAOCULAR LENS PLACEMENT (IOC) (Left)  Patient location during evaluation: PACU Anesthesia Type: MAC Level of consciousness: awake and alert and oriented Pain management: satisfactory to patient Vital Signs Assessment: post-procedure vital signs reviewed and stable Respiratory status: spontaneous breathing, nonlabored ventilation and respiratory function stable Cardiovascular status: blood pressure returned to baseline and stable Postop Assessment: Adequate PO intake and No signs of nausea or vomiting Anesthetic complications: no    Cherly BeachStella, Connelly Spruell J

## 2016-03-07 NOTE — Op Note (Signed)
OPERATIVE NOTE  Sheran LawlessShelvia S Brazeau [id number] 03/07/2016   PREOPERATIVE DIAGNOSIS:  Nuclear sclerotic cataract left eye.  H25.12   POSTOPERATIVE DIAGNOSIS:    Nuclear sclerotic cataract left eye.     PROCEDURE:  Phacoemusification with posterior chamber intraocular lens placement of the left eye   LENS:   Implant Name Type Inv. Item Serial No. Manufacturer Lot No. LRB No. Used  LENS IOL DIOP 19.5 - [id number] Intraocular Lens LENS IOL DIOP 19.5 [id number] AMO   Left 1       PCB00 +19.5   ULTRASOUND TIME: 0 minutes 51 seconds.  CDE 8.45   SURGEON:  Willey BladeBradley King, MD, MPH   ANESTHESIA:  Topical with tetracaine drops augmented with 1% preservative-free intracameral lidocaine.   COMPLICATIONS:  None.   DESCRIPTION OF PROCEDURE:  The patient was identified in the holding room and transported to the operating room and placed in the supine position under the operating microscope.  The left eye was identified as the operative eye and it was prepped and draped in the usual sterile ophthalmic fashion.   A 1.0 millimeter clear-corneal paracentesis was made at the 5:00 position. 0.5 ml of preservative-free 1% lidocaine with epinephrine was injected into the anterior chamber.  The anterior chamber was filled with Healon 5 viscoelastic.  A 2.4 millimeter keratome was used to make a near-clear corneal incision at the 2:00 position.  A curvilinear capsulorrhexis was made with a cystotome and capsulorrhexis forceps.  Balanced salt solution was used to hydrodissect and hydrodelineate the nucleus.   Phacoemulsification was then used in stop and chop fashion to remove the lens nucleus and epinucleus.  The remaining cortex was then removed using the irrigation and aspiration handpiece. Healon was then placed into the capsular bag to distend it for lens placement.  A lens was then injected into the capsular bag.  The remaining viscoelastic was aspirated.   Wounds were hydrated with balanced salt solution.   The anterior chamber was inflated to a physiologic pressure with balanced salt solution.  Intracameral vigamox 0.1 mL undiltued was injected into the eye and a drop placed onto the ocular surface.  No wound leaks were noted.  The patient was taken to the recovery room in stable condition without complications of anesthesia or surgery  Willey BladeBradley King 03/07/2016, 8:00 AM

## 2016-03-07 NOTE — Transfer of Care (Signed)
Immediate Anesthesia Transfer of Care Note  Patient: Margaret LawlessShelvia S Bram  Procedure(s) Performed: Procedure(s) with comments: CATARACT EXTRACTION PHACO AND INTRAOCULAR LENS PLACEMENT (IOC) (Left) - DIABETIC - oral meds LEFT  Patient Location: PACU  Anesthesia Type: MAC  Level of Consciousness: awake, alert  and patient cooperative  Airway and Oxygen Therapy: Patient Spontanous Breathing and Patient connected to supplemental oxygen  Post-op Assessment: Post-op Vital signs reviewed, Patient's Cardiovascular Status Stable, Respiratory Function Stable, Patent Airway and No signs of Nausea or vomiting  Post-op Vital Signs: Reviewed and stable  Complications: No apparent anesthesia complications

## 2016-03-07 NOTE — Anesthesia Preprocedure Evaluation (Signed)
Anesthesia Evaluation  Patient identified by MRN, date of birth, ID band Patient awake    Reviewed: Allergy & Precautions, H&P , NPO status , Patient's Chart, lab work & pertinent test results  Airway Mallampati: II  TM Distance: >3 FB Neck ROM: full    Dental no notable dental hx.    Pulmonary    Pulmonary exam normal        Cardiovascular hypertension, Normal cardiovascular exam     Neuro/Psych TIACVA    GI/Hepatic GERD  ,  Endo/Other  diabetes  Renal/GU      Musculoskeletal   Abdominal   Peds  Hematology   Anesthesia Other Findings   Reproductive/Obstetrics                             Anesthesia Physical  Anesthesia Plan  ASA: II  Anesthesia Plan: MAC   Post-op Pain Management:    Induction:   Airway Management Planned:   Additional Equipment:   Intra-op Plan:   Post-operative Plan:   Informed Consent: I have reviewed the patients History and Physical, chart, labs and discussed the procedure including the risks, benefits and alternatives for the proposed anesthesia with the patient or authorized representative who has indicated his/her understanding and acceptance.     Plan Discussed with:   Anesthesia Plan Comments:         Anesthesia Quick Evaluation

## 2016-03-08 ENCOUNTER — Encounter: Payer: Self-pay | Admitting: Ophthalmology

## 2016-03-28 DIAGNOSIS — I10 Essential (primary) hypertension: Secondary | ICD-10-CM | POA: Diagnosis not present

## 2016-03-28 DIAGNOSIS — E1169 Type 2 diabetes mellitus with other specified complication: Secondary | ICD-10-CM | POA: Diagnosis not present

## 2016-04-11 LAB — HM DIABETES EYE EXAM

## 2016-05-02 ENCOUNTER — Telehealth: Payer: Self-pay | Admitting: Neurology

## 2016-05-02 NOTE — Telephone Encounter (Signed)
Rn call patient back about her driving ability. Rn stated per Dr. Pearlean BrownieSethi note to please limit her driving to day time and familiar routes.  Pt verbalized understanding.

## 2016-05-02 NOTE — Telephone Encounter (Signed)
Message sent to Dr. Pearlean BrownieSethi. Pt was last seen 12/2015.

## 2016-05-02 NOTE — Telephone Encounter (Signed)
The patient may drive if she feels she can safely do so despite mild left sided weakness as it has been 6 months since her stroke but to limit her driving initially to day time and familiar routes for few months

## 2016-05-02 NOTE — Telephone Encounter (Signed)
Patient is calling to see when it will alright for her to drive.

## 2016-05-09 DIAGNOSIS — I10 Essential (primary) hypertension: Secondary | ICD-10-CM | POA: Diagnosis not present

## 2016-05-09 DIAGNOSIS — R0789 Other chest pain: Secondary | ICD-10-CM | POA: Diagnosis not present

## 2016-05-09 DIAGNOSIS — E118 Type 2 diabetes mellitus with unspecified complications: Secondary | ICD-10-CM | POA: Diagnosis not present

## 2016-05-30 ENCOUNTER — Telehealth: Payer: Self-pay | Admitting: Neurology

## 2016-05-30 NOTE — Telephone Encounter (Signed)
Patient needs to schedule a dentist appointment and wants to know when she should stop taking her Aspirin before her appointment.

## 2016-05-30 NOTE — Telephone Encounter (Signed)
Rn call patient about her needing dental work [employer]. Rn stated the dental office can fax us a form and Dr. Pearlean BrownieSethi can fill it out. Pt verbalized understanding.

## 2016-06-20 DIAGNOSIS — R0789 Other chest pain: Secondary | ICD-10-CM | POA: Diagnosis not present

## 2016-06-20 DIAGNOSIS — R9431 Abnormal electrocardiogram [ECG] [EKG]: Secondary | ICD-10-CM | POA: Diagnosis not present

## 2016-06-20 DIAGNOSIS — R079 Chest pain, unspecified: Secondary | ICD-10-CM | POA: Diagnosis not present

## 2016-06-28 NOTE — Telephone Encounter (Signed)
I have reviewed the patient's chart. It appears she has not had a recurrent strokes since August 2017. It would be okay to hold aspirin for 3 days prior to dental extraction and to restart after procedure when safe with a small but acceptable peri-procedure the risk of TIA/stroke if patient is willing

## 2016-06-28 NOTE — Telephone Encounter (Signed)
Nurse from [facility]ist [facility] of [facility] [phone] called due to come extractions needed, I went over Katrina's response to when she last spoke with pt about this and she said there is not a form that she would have to fax [place] she would just like to know if Dr Pearlean Brownie would okay pt going 5 days without aspirin and Fishoil, they would prefer 5 days without but would take 3 days.  They would like to know before scheduling procedure needed.  [name] is asking for a call on 06-29-2016

## 2016-06-29 NOTE — Telephone Encounter (Signed)
Called and spoke with [name]. Relayed Dr Pearlean Brownie message below. She verbalized understanding. She will let the pt know. Faxed Dr Pearlean Brownie note as requested to [phone].

## 2016-07-17 ENCOUNTER — Encounter (INDEPENDENT_AMBULATORY_CARE_PROVIDER_SITE_OTHER): Payer: Self-pay

## 2016-07-17 ENCOUNTER — Encounter: Payer: Self-pay | Admitting: Nurse Practitioner

## 2016-07-17 ENCOUNTER — Ambulatory Visit (INDEPENDENT_AMBULATORY_CARE_PROVIDER_SITE_OTHER): Payer: PPO | Admitting: Nurse Practitioner

## 2016-07-17 VITALS — BP 130/68 | HR 61 | Wt 176.2 lb

## 2016-07-17 DIAGNOSIS — I639 Cerebral infarction, unspecified: Secondary | ICD-10-CM

## 2016-07-17 DIAGNOSIS — I10 Essential (primary) hypertension: Secondary | ICD-10-CM

## 2016-07-17 DIAGNOSIS — E785 Hyperlipidemia, unspecified: Secondary | ICD-10-CM | POA: Diagnosis not present

## 2016-07-17 NOTE — Patient Instructions (Addendum)
Stressed the importance of management of risk factors to prevent further stroke Continue Aspirin for secondary stroke prevention Maintain strict control of hypertension with blood pressure goal below 130/90, today's reading 130/68 continue antihypertensive medications Control of diabetes with hemoglobin A1c below 6.5 followed by primary care most recent 6.7 on 05/09/16  continue diabetic medications Cholesterol with LDL cholesterol less than 70, followed by primary care,  Continue simvastatin  Exercise by walking, slowly increase , eat healthy diet with whole grains,  fresh fruits and vegetables Given information on dizziness and reviewed with Patient Follow up 6 months Dizziness Dizziness is a common problem. It makes you feel unsteady or light-headed. You may feel like you are about to pass out (faint). Dizziness can lead to getting hurt if you stumble or fall. Dizziness can be caused by many things, including:  Medicines.  Not having enough water in your body (dehydration).  Illness. Follow these instructions at home: Eating and drinking   Drink enough fluid to keep your pee (urine) clear or pale yellow. This helps to keep you from getting dehydrated. Try to drink more clear fluids, such as water.  Do not drink alcohol.  Limit how much caffeine you drink or eat, if your doctor tells you to do that.  Limit how much salt (sodium) you drink or eat, if your doctor tells you to do that. Activity   Avoid making quick movements.  When you stand up from sitting in a chair, steady yourself until you feel okay.  In the morning, first sit up on the side of the bed. When you feel okay, stand slowly while you hold onto something. Do this until you know that your balance is fine.  If you need to stand in one place for a long time, move your legs often. Tighten and relax the muscles in your legs while you are standing.  Do not drive or use heavy machinery if you feel dizzy.  Avoid bending  down if you feel dizzy. Place items in your home so you can reach them easily without leaning over. Lifestyle   Do not use any products that contain nicotine or tobacco, such as cigarettes and e-cigarettes. If you need help quitting, ask your doctor.  Try to lower your stress level. You can do this by using methods such as yoga or meditation. Talk with your doctor if you need help. General instructions   Watch your dizziness for any changes.  Take over-the-counter and prescription medicines only as told by your doctor. Talk with your doctor if you think that you are dizzy because of a medicine that you are taking.  Tell a friend or a family member that you are feeling dizzy. If he or she notices any changes in your behavior, have this person call your doctor.  Keep all follow-up visits as told by your doctor. This is important. Contact a doctor if:  Your dizziness does not go away.  Your dizziness or light-headedness gets worse.  You feel sick to your stomach (nauseous).  You have trouble hearing.  You have new symptoms.  You are unsteady on your feet.  You feel like the room is spinning. Get help right away if:  You throw up (vomit) or have watery poop (diarrhea), and you cannot eat or drink anything.  You have trouble:  Talking.  Walking.  Swallowing.  Using your arms, hands, or legs.  You feel generally weak.  You are not thinking clearly, or you have trouble forming sentences. A  friend or family member may notice this.  You have:  Chest pain.  Pain in your belly (abdomen).  Shortness of breath.  Sweating.  Your vision changes.  You are bleeding.  You have a very bad headache.  You have neck pain or a stiff neck.  You have a fever. These symptoms may be an emergency. Do not wait to see if the symptoms will go away. Get medical help right away. Call your local emergency services (911 in the [place] Do not drive yourself to the  hospital. Summary  Dizziness makes you feel unsteady or light-headed. You may feel like you are about to pass out (faint).  Drink enough fluid to keep your pee (urine) clear or pale yellow. Do not drink alcohol.  Avoid making quick movements if you feel dizzy.  Watch your dizziness for any changes. This information is not intended to replace advice given to you by your health care provider. Make sure you discuss any questions you have with your health care provider. Document Released: 03/02/2011 Document Revised: 03/30/2016 Document Reviewed: 03/30/2016 Elsevier Interactive Patient Education  2017 [employer].

## 2016-07-17 NOTE — Progress Notes (Signed)
[facility]  PATIENT: Margaret Mcintosh DOB: Jun 13, 1932   REASON FOR VISIT: Follow-up for right basal ganglia infarct and subacute right occipital and pontine infarcts HISTORY FROM: Patient and neighbor    HISTORY OF PRESENT ILLNESS:UPDATE 07/17/2016 CM Margaret Mcintosh, 83 year old female returns for follow-up. She has a history of admission for stroke in August 2017. She is currently on aspirin 325 daily without further stroke or TIA symptoms. She has no bruising and no bleeding. She remains on simvastatin for hyperlipidemia. She denies any myalgias. Her labs are followed by Dr. Yetta Glassman at  [facility]. She is also diabetic and is on Glucophage. Her CBG this morning was 106. She admits to not getting  much exercise. She continues to drive without difficulty. She denies any falls or balance issues. She does occasionally have some dizziness when getting up from the bed or chair. She was encouraged to increase her water intake. She says she only takes about water to get her pills down. When last seen she was complaining of right leg numbness below the knee to the ankle. EMG nerve conduction was normal she returns for reevaluation   HISTORY 01/17/16 PSMs Margaret Mcintosh is a 89 year pleasant lady seen today for first office f/u visit following hospital admission for stroke in august 2017.Margaret S Russellis an 83 y.o.femalewho was normal last night at 2300 hours (LKW 11/04/2015 at 0000). She went to bed and awoke this AM at 0430 and noted she was off balance and listing to the left. She staggered back to bed and awoke at 0700 hours. Symptoms of off balance did not resolve and she felt herleft armwas very weak. She called EMS and was brought to [facility]. There a MD called IR here at [facility]. There was confusion as IR stated the chronic PCA was nothing they would intervene on and if they felt it was a wake up stroke she would need to be seen by a neurohospitalist to evaluate for wake up stroke.  For some unknown reason Neurohospitalist at [place] ( Dr. Loretha Brasil) was not consulted and she was transferred to [facility]. ON arrival she had no symptoms and NIHSS of 0. Patient was not administered IV t-PA secondary to admitting outside of the window. Shewas admitted for further evaluation and treatment.MRI scan showed small acute ight basal ganglia infarct and subacute right occipital and Pontine infarcts as well.She had neurological fluctuation of her left hemiparesis for few days. 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 She did not fall and denies back injury or radicular pain.She states her BP is good and is 137/65 today. She is tolerating lipitor well and staes her fasting sugars are good on Metformin.    REVIEW OF SYSTEMS: Full 14 system review of systems performed and notable only for those listed, all others are neg:  Constitutional: neg  Cardiovascular: neg Ear/Nose/Throat: neg  Skin: neg Eyes: neg Respiratory: neg Gastroitestinal: neg  Hematology/Lymphatic: neg  Endocrine: neg Musculoskeletal:neg Allergy/Immunology: neg Neurological: Occasional dizziness Psychiatric: neg Sleep : neg   ALLERGIES: Allergies  Allergen Reactions  . Codeine Other (See Comments)    Gets  nausea and feels like"something is crawling"  . Diphenhydramine Hcl Other (See Comments)    Feels like skin crawling  . Other Other (See Comments)    CANNOT HAVE ANY "SPICY" FOODS!!    HOME MEDICATIONS: Outpatient Medications Prior to Visit  Medication Sig Dispense Refill  . acetaminophen (TYLENOL) 325 MG tablet Take 325-650  mg by mouth every 6 (six) hours as needed for mild pain.    Marland Kitchen amLODipine (NORVASC) 5 MG tablet Take 1 tablet (5 mg total) by mouth daily. 30 tablet 0  . aspirin EC 325 MG EC tablet Take 1 tablet (325 mg total) by mouth daily. 30 tablet 0  . labetalol (NORMODYNE) 100 MG tablet Take 1 tablet (100 mg total) by mouth 3 (three) times daily. 90 tablet 0  . losartan (COZAAR) 50 MG tablet Take 50 mg by mouth daily.    . metFORMIN (GLUCOPHAGE) 500 MG tablet Take by mouth 2 (two) times daily with a meal.     . nystatin (MYCOSTATIN/NYSTOP) powder Apply topically 2 (two) times daily. (Patient taking differently: Apply topically 2 (two) times daily as needed. ) 15 g 0  . Omega-3 Fatty Acids (FISH OIL) 1200 MG CAPS Take by mouth 3 (three) times daily.    . pantoprazole (PROTONIX) 40 MG tablet Take 1 tablet (40 mg total) by mouth daily at 6 (six) AM. (Patient taking differently: Take 40 mg by mouth as needed. ) 30 tablet 0  . simvastatin (ZOCOR) 20 MG tablet Take 1 tablet (20 mg total) by mouth daily at 6 PM. 30 tablet 0   No facility-administered medications prior to visit.     PAST MEDICAL HISTORY: Past Medical History:  Diagnosis Date  . Arthritis   . Cataract   . Complication of anesthesia    "takes a long time to wake me up" (11/04/2015)  . Diabetes mellitus (HCC)   . GERD (gastroesophageal reflux disease)   . Glaucoma   . Hypertension   . Stroke [facility])    last 8/17. No deficits    PAST SURGICAL HISTORY: Past Surgical History:  Procedure Laterality Date  . ANKLE FRACTURE SURGERY Left 2000   "screw is still there" (11/04/2015)  . APPENDECTOMY  ~1960  . CATARACT EXTRACTION W/PHACO Right 02/08/2016   Procedure: CATARACT EXTRACTION PHACO AND INTRAOCULAR LENS PLACEMENT (IOC);  Surgeon: Nevada Crane, MD;  Location: [facility];  Service: Ophthalmology;  Laterality: Right;  DIABETIC - oral meds RIGHT  . CATARACT EXTRACTION W/PHACO Left 03/07/2016   Procedure: CATARACT EXTRACTION PHACO  AND INTRAOCULAR LENS PLACEMENT (IOC);  Surgeon: Nevada Crane, MD;  Location: [facility];  Service: Ophthalmology;  Laterality: Left;  DIABETIC - oral meds LEFT  . FRACTURE SURGERY    . VAGINAL HYSTERECTOMY  1973    FAMILY HISTORY: Family History  Problem Relation Age of Onset  . Hypertension Mother   . Hypertension Father   . Cancer Sister   . Hypertension Sister   . Diabetes Sister   . Cancer Brother   . Diabetes Brother     SOCIAL HISTORY: Social History   Social History  . Marital status: Widowed    Spouse name: N/A  . Number of children: N/A  . Years of education: N/A   Occupational History  . Not on file.   Social History Main Topics  . Smoking status: Never Smoker  . Smokeless tobacco: Never Used  . Alcohol use No  . Drug use: No  . Sexual activity: No  Other Topics Concern  . Not on file   Social History Narrative  . No narrative on file     PHYSICAL EXAM  Vitals:   07/17/16 1005  BP: 130/68  Pulse: 61  Weight: 176 lb 3.2 oz (79.9 kg)   Body mass index is 29.32 kg/m.  Generalized: Well developed, in no acute distress  Head: normocephalic and atraumatic,. Oropharynx benign  Neck: Supple, no carotid bruits  Cardiac: Regular rate rhythm, no murmur  Musculoskeletal: No deformity   Neurological examination   Mentation: Alert oriented to time, place, history taking. Attention span and concentration appropriate. Recent and remote memory intact.  Follows all commands speech and language fluent.   Cranial nerve II-XII: Fundoscopic exam reveals sharp disc margins.Pupils were equal round reactive to light extraocular movements were full, visual field were full on confrontational test. Facial sensation and strength were normal. hearing was intact to finger rubbing bilaterally. Uvula tongue midline. head turning and shoulder shrug were normal and symmetric.Tongue protrusion into cheek strength was normal. Motor: normal bulk and tone, full  strength in the BUE, BLE, fine finger movements normal, no pronator drift. No focal weakness Sensory: normal and symmetric to light touch, pinprick, and  Vibration, in the upper and lower extremities  Coordination: finger-nose-finger, heel-to-shin bilaterally, no dysmetria Reflexes: 1+ upper lower and symmetric, plantar responses were flexor bilaterally. Gait and Station: Rising up from seated position without assistance, normal stance,  moderate stride, good arm swing, smooth turning, able to perform tiptoe, and heel walking without difficulty. Tandem gait is unsteady  DIAGNOSTIC DATA (LABS, IMAGING, TESTING) - I reviewed patient records, labs, notes, testing and imaging myself where available.  Lab Results  Component Value Date   WBC 5.6 11/05/2015   HGB 14.0 11/05/2015   HCT 44.1 11/05/2015   MCV 92.3 11/05/2015   PLT 183 11/05/2015      Component Value Date/Time   NA 139 11/08/2015 0553   NA 135 (L) 04/09/2013 1025   K 3.8 11/08/2015 0553   K 3.8 04/09/2013 1025   CL 104 11/08/2015 0553   CL 103 04/09/2013 1025   CO2 27 11/08/2015 0553   CO2 28 04/09/2013 1025   GLUCOSE 137 (H) 11/08/2015 0553   GLUCOSE 211 (H) 04/09/2013 1025   BUN 11 11/08/2015 0553   BUN 14 04/09/2013 1025   CREATININE 0.86 11/08/2015 0553   CREATININE 0.87 04/09/2013 1025   CALCIUM 9.3 11/08/2015 0553   CALCIUM 9.2 04/09/2013 1025   PROT 7.9 11/04/2015 0912   PROT 8.1 04/09/2013 1025   ALBUMIN 4.3 11/04/2015 0912   ALBUMIN 4.0 04/09/2013 1025   AST 20 11/04/2015 0912   AST 28 04/09/2013 1025   ALT 15 11/04/2015 0912   ALT 21 04/09/2013 1025   ALKPHOS 72 11/04/2015 0912   ALKPHOS 74 04/09/2013 1025   BILITOT 0.9 11/04/2015 0912   BILITOT 0.5 04/09/2013 1025   GFRNONAA >60 11/08/2015 0553   GFRNONAA >60 04/09/2013 1025   GFRAA >60 11/08/2015 0553   GFRAA >60 04/09/2013 1025   Lab Results  Component Value Date   CHOL 169 11/05/2015   HDL 46 11/05/2015   LDLCALC 100 (H) 11/05/2015   TRIG  117 11/05/2015   CHOLHDL 3.7 11/05/2015   Lab Results  Component Value Date   HGBA1C 7.8 (H) 11/05/2015    ASSESSMENT AND PLAN 83 year patient with right basal ganglia infarct in August 2017 from small vessel disease.Vascular risk factors of hypertension  and Hyperlipidimia.Right leg paresthesias of  unclear etiology. EMG /[place] was normal  PLAN: Stressed the importance of management of risk factors to prevent further stroke Continue Aspirin for secondary stroke prevention Maintain strict control of hypertension with blood pressure goal below 130/90, today's reading 130/68 continue antihypertensive medications Control of diabetes with hemoglobin A1c below 6.5 followed by primary care most recent 6.7 on 05/09/16  continue diabetic medications Cholesterol with LDL cholesterol less than 70, followed by primary care,  Continue simvastatin  Exercise by walking, slowly increase , eat healthy diet with whole grains,  fresh fruits and vegetables Given information on dizziness, increase fluid intake Follow up 6 months, if stable will dismiss I spent 25 min  in total face to face time with the patient more than 50% of which was spent counseling and coordination of care, reviewing test results reviewing medications and discussing and reviewing the diagnosis of stroke, management of risk factors and dizziness and further treatment options. Patient advised to slowly get up from a chair. Avoid making quick movements. Stay well hydrated. Reviewed information about dizziness Nilda Riggs, [facility], [facility], APRN  [facility] [address], [address] [place], [place] [address] [phone]

## 2016-07-19 NOTE — Progress Notes (Signed)
I agree with the above plan 

## 2016-08-04 ENCOUNTER — Other Ambulatory Visit: Payer: Self-pay | Admitting: Pharmacy Technician

## 2016-08-04 NOTE — Patient Outreach (Signed)
[facility] Care Management  08/04/2016  Sheran LawlessShelvia S Greaser 09-23-1932 [id number]   Contacted patient in reference to medication adherence for [employer]. Patient states she is still taking Losartan, Metformin, and Simvastatin as prescribed. She admits that she has trouble remembering to take her afternoon doses but otherwise is compliant. Patient utilizes a pillbox and is interested in getting 3 month supplies. I contacted Dr. Bo Mcclintockabellon's office on patient's behalf on 5-11 and made the request which will help with cost, convenience, and adherence. Patient has appointment on Monday to follow up with physician.  Daryll Brodrystal Marsela Kuan, CPhT [facility] [phone]

## 2016-08-07 DIAGNOSIS — E118 Type 2 diabetes mellitus with unspecified complications: Secondary | ICD-10-CM | POA: Diagnosis not present

## 2016-08-07 DIAGNOSIS — Z8673 Personal history of transient ischemic attack (TIA), and cerebral infarction without residual deficits: Secondary | ICD-10-CM | POA: Diagnosis not present

## 2016-08-07 DIAGNOSIS — J45909 Unspecified asthma, uncomplicated: Secondary | ICD-10-CM | POA: Diagnosis not present

## 2016-08-07 DIAGNOSIS — I10 Essential (primary) hypertension: Secondary | ICD-10-CM | POA: Diagnosis not present

## 2016-08-07 DIAGNOSIS — Z888 Allergy status to other drugs, medicaments and biological substances status: Secondary | ICD-10-CM | POA: Diagnosis not present

## 2016-08-07 DIAGNOSIS — Z7984 Long term (current) use of oral hypoglycemic drugs: Secondary | ICD-10-CM | POA: Diagnosis not present

## 2016-08-07 DIAGNOSIS — Z885 Allergy status to narcotic agent status: Secondary | ICD-10-CM | POA: Diagnosis not present

## 2016-08-07 DIAGNOSIS — M1990 Unspecified osteoarthritis, unspecified site: Secondary | ICD-10-CM | POA: Diagnosis not present

## 2016-08-07 DIAGNOSIS — Z79899 Other long term (current) drug therapy: Secondary | ICD-10-CM | POA: Diagnosis not present

## 2016-09-13 DIAGNOSIS — E785 Hyperlipidemia, unspecified: Secondary | ICD-10-CM | POA: Diagnosis not present

## 2016-10-02 DIAGNOSIS — E118 Type 2 diabetes mellitus with unspecified complications: Secondary | ICD-10-CM | POA: Diagnosis not present

## 2016-10-02 DIAGNOSIS — Z8673 Personal history of transient ischemic attack (TIA), and cerebral infarction without residual deficits: Secondary | ICD-10-CM | POA: Diagnosis not present

## 2016-10-02 DIAGNOSIS — E785 Hyperlipidemia, unspecified: Secondary | ICD-10-CM | POA: Diagnosis not present

## 2016-10-02 DIAGNOSIS — I10 Essential (primary) hypertension: Secondary | ICD-10-CM | POA: Diagnosis not present

## 2016-11-13 DIAGNOSIS — I10 Essential (primary) hypertension: Secondary | ICD-10-CM | POA: Diagnosis not present

## 2016-11-13 DIAGNOSIS — H6523 Chronic serous otitis media, bilateral: Secondary | ICD-10-CM | POA: Diagnosis not present

## 2016-11-13 DIAGNOSIS — Z8673 Personal history of transient ischemic attack (TIA), and cerebral infarction without residual deficits: Secondary | ICD-10-CM | POA: Diagnosis not present

## 2016-11-13 DIAGNOSIS — E118 Type 2 diabetes mellitus with unspecified complications: Secondary | ICD-10-CM | POA: Diagnosis not present

## 2017-01-16 NOTE — Progress Notes (Signed)
[facility]  PATIENT: Margaret Mcintosh DOB: [birth year]   REASON FOR VISIT: Follow-up for right basal ganglia infarct and subacute right occipital and pontine infarcts in August 2017 HISTORY FROM: Patient and neighbor    HISTORY OF PRESENT ILLNESS:UPDATE 02/24/2018CM Margaret Mcintosh, 83 year old female returns for follow-up with history of stroke in August 2017. She remains on aspirin 325 daily without recurrent stroke or TIA symptoms. She has minimal bruising, no bleeding. She reports blood sugars between 100-114 most days. Blood pressure in the office today 134/75. She remains on Zocor without myalgias. She gets little exercise. She is independent in all activities of daily living. She continues to drive without difficulty. She has not had any falls. She returns for reevaluation. She has regular follow-up with her primary care Dr. Baldwin Crown.  UPDATE 07/17/2016 CM Margaret Mcintosh, 83 year old female returns for follow-up. She has a history of admission for stroke in August 2017. She is currently on aspirin 325 daily without further stroke or TIA symptoms. She has no bruising and no bleeding. She remains on simvastatin for hyperlipidemia. She denies any myalgias. Her labs are followed by Dr. Yetta Glassman at  [facility]. She is also diabetic and is on Glucophage. Her CBG this morning was 106. She admits to not getting  much exercise. She continues to drive without difficulty. She denies any falls or balance issues. She does occasionally have some dizziness when getting up from the bed or chair. She was encouraged to increase her water intake. She says she only takes about water to get her pills down. When last seen she was complaining of right leg numbness below the knee to the ankle. EMG nerve conduction was normal she returns for reevaluation   HISTORY 01/17/16 PSMs Margaret Mcintosh is a 40 year pleasant lady seen today for first office f/u visit following hospital admission for stroke in august  2017.Margaret S Russellis an 83 y.o.femalewho was normal last night at 2300 hours (LKW 11/04/2015 at 0000). She went to bed and awoke this AM at 0430 and noted she was off balance and listing to the left. She staggered back to bed and awoke at 0700 hours. Symptoms of off balance did not resolve and she felt herleft armwas very weak. She called EMS and was brought to [facility]. There a MD called [facility] here [facility]. There was confusion as IR stated the chronic PCA was nothing they would intervene on and if they felt it was a wake up stroke she would need to be seen by a neurohospitalist to evaluate for wake up stroke. For some unknown reason Neurohospitalist at [place] ( Dr. Loretha Brasil) was not consulted and she was transferred to [facility]. ON arrival she had no symptoms and NIHSS of 0. Patient was not administered IV t-PA secondary to admitting outside of the window. Shewas admitted for further evaluation and treatment.MRI scan showed small acute ight basal ganglia infarct and subacute right occipital and Pontine infarcts as well.She had neurological fluctuation of her left hemiparesis for few days. 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 She did not fall and denies back injury or radicular  pain.She states her BP is good and is 137/65 today. She is tolerating lipitor well and staes her fasting sugars are good on Metformin.    REVIEW OF SYSTEMS: Full 14 system review of systems performed and notable only for those listed, all others are neg:  Constitutional: neg  Cardiovascular: neg Ear/Nose/Throat: neg  Skin:  neg Eyes: neg Respiratory: neg Gastroitestinal: neg  Hematology/Lymphatic: neg  Endocrine: neg Musculoskeletal: Occasional neck pain Allergy/Immunology: neg Neurological: neg Psychiatric: neg Sleep : neg   ALLERGIES: Allergies  Allergen Reactions  . Codeine Other (See Comments)    Gets nausea and feels like"something is crawling"  . Diphenhydramine Hcl Other (See Comments)    Feels like skin crawling  . Other Other (See Comments)    CANNOT HAVE ANY "SPICY" FOODS!!    HOME MEDICATIONS: Outpatient Medications Prior to Visit  Medication Sig Dispense Refill  . acetaminophen (TYLENOL) 325 MG tablet Take 325-650 mg by mouth every 6 (six) hours as needed for mild pain.    Marland Kitchen amLODipine (NORVASC) 5 MG tablet Take 1 tablet (5 mg total) by mouth daily. 30 tablet 0  . aspirin EC 325 MG EC tablet Take 1 tablet (325 mg total) by mouth daily. 30 tablet 0  . labetalol (NORMODYNE) 100 MG tablet Take 1 tablet (100 mg total) by mouth 3 (three) times daily. 90 tablet 0  . losartan (COZAAR) 50 MG tablet Take 50 mg by mouth daily.    . metFORMIN (GLUCOPHAGE) 500 MG tablet Take by mouth 2 (two) times daily with a meal.     . nystatin (MYCOSTATIN/NYSTOP) powder Apply topically 2 (two) times daily. (Patient taking differently: Apply topically 2 (two) times daily as needed. ) 15 g 0  . Omega-3 Fatty Acids (FISH OIL) 1200 MG CAPS Take by mouth 3 (three) times daily.    . pantoprazole (PROTONIX) 40 MG tablet Take 1 tablet (40 mg total) by mouth daily at 6 (six) AM. (Patient taking differently: Take 40 mg by mouth as needed. ) 30 tablet 0  . simvastatin (ZOCOR) 20 MG tablet Take 1 tablet (20 mg total) by mouth daily at 6 PM. 30 tablet 0   No facility-administered medications prior to visit.     PAST MEDICAL HISTORY: Past Medical History:  Diagnosis Date  . Arthritis   . Cataract   . Complication of anesthesia    "takes a long time to wake me up" (11/04/2015)  . Diabetes mellitus (HCC)   . GERD  (gastroesophageal reflux disease)   . Glaucoma   . Hypertension   . Stroke [facility])    last 8/17. No deficits    PAST SURGICAL HISTORY: Past Surgical History:  Procedure Laterality Date  . ANKLE FRACTURE SURGERY Left 2000   "screw is still there" (11/04/2015)  . APPENDECTOMY  ~1960  . CATARACT EXTRACTION W/PHACO Right 02/08/2016   Procedure: CATARACT EXTRACTION PHACO AND INTRAOCULAR LENS PLACEMENT (IOC);  Surgeon: Nevada Crane, MD;  Location: [facility];  Service: Ophthalmology;  Laterality: Right;  DIABETIC - oral meds RIGHT  . CATARACT EXTRACTION W/PHACO Left 03/07/2016   Procedure: CATARACT EXTRACTION PHACO AND INTRAOCULAR LENS PLACEMENT (IOC);  Surgeon: Nevada Crane, MD;  Location: [facility];  Service: Ophthalmology;  Laterality: Left;  DIABETIC - oral meds LEFT  . FRACTURE SURGERY    . VAGINAL HYSTERECTOMY  1973    FAMILY HISTORY: Family History  Problem Relation Age of Onset  . Hypertension Mother   . Hypertension Father   .  Cancer Sister   . Hypertension Sister   . Diabetes Sister   . Cancer Brother   . Diabetes Brother     SOCIAL HISTORY: Social History   Social History  . Marital status: Widowed    Spouse name: N/A  . Number of children: N/A  . Years of education: N/A   Occupational History  . Not on file.   Social History Main Topics  . Smoking status: Never Smoker  . Smokeless tobacco: Never Used  . Alcohol use No  . Drug use: No  . Sexual activity: No   Other Topics Concern  . Not on file   Social History Narrative  . No narrative on file     PHYSICAL EXAM  Vitals:   01/17/17 0954  BP: 134/75  Pulse: 63  Weight: 178 lb (80.7 kg)  Height: 5\' 5"  (1.651 m)   Body mass index is 29.62 kg/m.  Generalized: Well developed, in no acute distress  Head: normocephalic and atraumatic,. Oropharynx benign  Neck: Supple, no carotid bruits  Cardiac: Regular rate rhythm, no murmur  Musculoskeletal: No deformity    Neurological examination   Mentation: Alert oriented to time, place, history taking. Attention span and concentration appropriate. Recent and remote memory intact.  Follows all commands speech and language fluent.   Cranial nerve II-XII: Fundoscopic exam not done.Pupils were equal round reactive to light extraocular movements were full, visual field were full on confrontational test. Facial sensation and strength were normal. hearing was intact to finger rubbing bilaterally. Uvula tongue midline. head turning and shoulder shrug were normal and symmetric.Tongue protrusion into cheek strength was normal. Motor: normal bulk and tone, full strength in the BUE, BLE, fine finger movements normal, no pronator drift. No focal weakness Sensory: normal and symmetric to light touch, pinprick, and  Vibration, in the upper and lower extremities  Coordination: finger-nose-finger, heel-to-shin bilaterally, no dysmetria, no tremor Reflexes: 1+ upper lower and symmetric, plantar responses were flexor bilaterally. Gait and Station: Rising up from seated position without assistance, normal stance,  moderate stride, good arm swing, smooth turning, able to perform tiptoe, and heel walking without difficulty. Tandem gait is steady. No assistive device  DIAGNOSTIC DATA (LABS, IMAGING, TESTING) - I reviewed patient records, labs, notes, testing and imaging myself where available.  Lab Results  Component Value Date   WBC 5.6 11/05/2015   HGB 14.0 11/05/2015   HCT 44.1 11/05/2015   MCV 92.3 11/05/2015   PLT 183 11/05/2015      Component Value Date/Time   NA 139 11/08/2015 0553   NA 135 (L) 04/09/2013 1025   K 3.8 11/08/2015 0553   K 3.8 04/09/2013 1025   CL 104 11/08/2015 0553   CL 103 04/09/2013 1025   CO2 27 11/08/2015 0553   CO2 28 04/09/2013 1025   GLUCOSE 137 (H) 11/08/2015 0553   GLUCOSE 211 (H) 04/09/2013 1025   BUN 11 11/08/2015 0553   BUN 14 04/09/2013 1025   CREATININE 0.86 11/08/2015 0553    CREATININE 0.87 04/09/2013 1025   CALCIUM 9.3 11/08/2015 0553   CALCIUM 9.2 04/09/2013 1025   PROT 7.9 11/04/2015 0912   PROT 8.1 04/09/2013 1025   ALBUMIN 4.3 11/04/2015 0912   ALBUMIN 4.0 04/09/2013 1025   AST 20 11/04/2015 0912   AST 28 04/09/2013 1025   ALT 15 11/04/2015 0912   ALT 21 04/09/2013 1025   ALKPHOS 72 11/04/2015 0912   ALKPHOS 74 04/09/2013 1025   BILITOT 0.9 11/04/2015 0912  BILITOT 0.5 04/09/2013 1025   GFRNONAA >60 11/08/2015 0553   GFRNONAA >60 04/09/2013 1025   GFRAA >60 11/08/2015 0553   GFRAA >60 04/09/2013 1025   Lab Results  Component Value Date   CHOL 169 11/05/2015   HDL 46 11/05/2015   LDLCALC 100 (H) 11/05/2015   TRIG 117 11/05/2015   CHOLHDL 3.7 11/05/2015   Lab Results  Component Value Date   HGBA1C 7.8 (H) 11/05/2015    ASSESSMENT AND PLAN 84 year patient with right basal ganglia infarct in August 2017 from small vessel disease.Vascular risk factors of hypertension  and Hyperlipidimia and diabetes, age and sex.   PLAN: Stressed the importance of management of risk factors to prevent further stroke Continue Aspirin for secondary stroke prevention Maintain strict control of hypertension with blood pressure goal below 130/90, today's reading 134/75 continue antihypertensive medications Control of diabetes with hemoglobin A1c below 6.5 followed by primary care continue diabetic medications Cholesterol with LDL cholesterol less than 70, followed by primary care,  Continue simvastatin  Exercise by walking, 30 min daily  eat healthy diet with whole grains,  fresh fruits and vegetables Will discharge from stroke clinic I spent 25 min  in total face to face time with the patient more than 50% of which was spent counseling and coordination of care, reviewing test results reviewing medications and discussing and reviewing the diagnosis of stroke, management of risk factors and importance of   Staying  well hydrated.  Nilda Riggs, [facility], [facility],  APRN  [facility] [address] [place], [place] [address] [phone]

## 2017-01-17 ENCOUNTER — Ambulatory Visit (INDEPENDENT_AMBULATORY_CARE_PROVIDER_SITE_OTHER): Payer: PPO | Admitting: Nurse Practitioner

## 2017-01-17 ENCOUNTER — Encounter (INDEPENDENT_AMBULATORY_CARE_PROVIDER_SITE_OTHER): Payer: Self-pay

## 2017-01-17 ENCOUNTER — Encounter: Payer: Self-pay | Admitting: Nurse Practitioner

## 2017-01-17 VITALS — BP 134/75 | HR 63 | Ht 65.0 in | Wt 178.0 lb

## 2017-01-17 DIAGNOSIS — I10 Essential (primary) hypertension: Secondary | ICD-10-CM

## 2017-01-17 DIAGNOSIS — I639 Cerebral infarction, unspecified: Secondary | ICD-10-CM

## 2017-01-17 DIAGNOSIS — E119 Type 2 diabetes mellitus without complications: Secondary | ICD-10-CM

## 2017-01-17 DIAGNOSIS — E785 Hyperlipidemia, unspecified: Secondary | ICD-10-CM

## 2017-01-17 NOTE — Patient Instructions (Addendum)
Stressed the importance of management of risk factors to prevent further stroke Continue Aspirin for secondary stroke prevention Maintain strict control of hypertension with blood pressure goal below 130/90, today's reading 134/75 continue antihypertensive medications Control of diabetes with hemoglobin A1c below 6.5 followed by primary care continue diabetic medications Cholesterol with LDL cholesterol less than 70, followed by primary care,  Continue simvastatin  Exercise by walking, 30 min daily  eat healthy diet with whole grains,  fresh fruits and vegetables Will discharge from stroke clinic  Stroke Prevention Some health problems and behaviors may make it more likely for you to have a stroke. Below are ways to lessen your risk of having a stroke.  Be active for at least 30 minutes on most or all days.  Do not smoke. Try not to be around others who smoke.  Do not drink too much alcohol. ? Do not have more than 2 drinks a day if you are a man. ? Do not have more than 1 drink a day if you are a woman and are not pregnant.  Eat healthy foods, such as fruits and vegetables. If you were put on a specific diet, follow the diet as told.  Keep your cholesterol levels under control through diet and medicines. Look for foods that are low in saturated fat, trans fat, cholesterol, and are high in fiber.  If you have diabetes, follow all diet plans and take your medicine as told.  Ask your doctor if you need treatment to lower your blood pressure. If you have high blood pressure (hypertension), follow all diet plans and take your medicine as told by your doctor.  If you are 7118-81 years old, have your blood pressure checked every 3-5 years. If you are age 81 or older, have your blood pressure checked every year.  Keep a healthy weight. Eat foods that are low in calories, salt, saturated fat, trans fat, and cholesterol.  Do not take drugs.  Avoid birth control pills, if this applies. Talk to  your doctor about the risks of taking birth control pills.  Talk to your doctor if you have sleep problems (sleep apnea).  Take all medicine as told by your doctor. ? You may be told to take aspirin or blood thinner medicine. Take this medicine as told by your doctor. ? Understand your medicine instructions.  Make sure any other conditions you have are being taken care of.  Get help right away if:  You suddenly lose feeling (you feel numb) or have weakness in your face, arm, or leg.  Your face or eyelid hangs down to one side.  You suddenly feel confused.  You have trouble talking (aphasia) or understanding what people are saying.  You suddenly have trouble seeing in one or both eyes.  You suddenly have trouble walking.  You are dizzy.  You lose your balance or your movements are clumsy (uncoordinated).  You suddenly have a very bad headache and you do not know the cause.  You have new chest pain.  Your heart feels like it is fluttering or skipping a beat (irregular heartbeat). Do not wait to see if the symptoms above go away. Get help right away. Call your local emergency services (911 in [place] Do not drive yourself to the hospital. This information is not intended to replace advice given to you by your health care provider. Make sure you discuss any questions you have with your health care provider. Document Released: 09/12/2011 Document Revised: 08/19/2015 Document Reviewed: 09/13/2012  Elsevier Interactive Patient Education  2018 Elsevier Inc.  

## 2017-01-22 NOTE — Progress Notes (Signed)
I agree with the above plan 

## 2017-01-24 DIAGNOSIS — E119 Type 2 diabetes mellitus without complications: Secondary | ICD-10-CM | POA: Diagnosis not present

## 2017-01-24 LAB — HM DIABETES EYE EXAM

## 2017-02-12 DIAGNOSIS — Z7982 Long term (current) use of aspirin: Secondary | ICD-10-CM | POA: Diagnosis not present

## 2017-02-12 DIAGNOSIS — I10 Essential (primary) hypertension: Secondary | ICD-10-CM | POA: Diagnosis not present

## 2017-02-12 DIAGNOSIS — Z7984 Long term (current) use of oral hypoglycemic drugs: Secondary | ICD-10-CM | POA: Diagnosis not present

## 2017-02-12 DIAGNOSIS — Z23 Encounter for immunization: Secondary | ICD-10-CM | POA: Diagnosis not present

## 2017-02-12 DIAGNOSIS — Z8673 Personal history of transient ischemic attack (TIA), and cerebral infarction without residual deficits: Secondary | ICD-10-CM | POA: Diagnosis not present

## 2017-02-12 DIAGNOSIS — Z79899 Other long term (current) drug therapy: Secondary | ICD-10-CM | POA: Diagnosis not present

## 2017-02-12 DIAGNOSIS — E118 Type 2 diabetes mellitus with unspecified complications: Secondary | ICD-10-CM | POA: Diagnosis not present

## 2017-02-12 DIAGNOSIS — R351 Nocturia: Secondary | ICD-10-CM | POA: Diagnosis not present

## 2017-02-12 DIAGNOSIS — J45909 Unspecified asthma, uncomplicated: Secondary | ICD-10-CM | POA: Diagnosis not present

## 2017-02-19 DIAGNOSIS — I10 Essential (primary) hypertension: Secondary | ICD-10-CM | POA: Diagnosis not present

## 2017-02-19 DIAGNOSIS — E119 Type 2 diabetes mellitus without complications: Secondary | ICD-10-CM | POA: Diagnosis not present

## 2017-02-19 DIAGNOSIS — M545 Low back pain: Secondary | ICD-10-CM | POA: Diagnosis not present

## 2017-03-29 DIAGNOSIS — H02834 Dermatochalasis of left upper eyelid: Secondary | ICD-10-CM | POA: Diagnosis not present

## 2017-03-29 DIAGNOSIS — H02831 Dermatochalasis of right upper eyelid: Secondary | ICD-10-CM | POA: Diagnosis not present

## 2017-05-31 ENCOUNTER — Encounter: Payer: Self-pay | Admitting: Nurse Practitioner

## 2017-05-31 ENCOUNTER — Other Ambulatory Visit: Payer: Self-pay

## 2017-05-31 ENCOUNTER — Ambulatory Visit (INDEPENDENT_AMBULATORY_CARE_PROVIDER_SITE_OTHER): Payer: PPO | Admitting: Nurse Practitioner

## 2017-05-31 VITALS — BP 122/53 | HR 57 | Temp 97.9°F | Ht 64.0 in | Wt 176.0 lb

## 2017-05-31 DIAGNOSIS — E119 Type 2 diabetes mellitus without complications: Secondary | ICD-10-CM

## 2017-05-31 DIAGNOSIS — K219 Gastro-esophageal reflux disease without esophagitis: Secondary | ICD-10-CM

## 2017-05-31 DIAGNOSIS — Z7689 Persons encountering health services in other specified circumstances: Secondary | ICD-10-CM

## 2017-05-31 DIAGNOSIS — I10 Essential (primary) hypertension: Secondary | ICD-10-CM

## 2017-05-31 DIAGNOSIS — E782 Mixed hyperlipidemia: Secondary | ICD-10-CM

## 2017-05-31 LAB — COMPLETE METABOLIC PANEL WITH GFR
AG Ratio: 1.6 (calc) (ref 1.0–2.5)
ALT: 14 U/L (ref 6–29)
AST: 16 U/L (ref 10–35)
Albumin: 4.7 g/dL (ref 3.6–5.1)
Alkaline phosphatase (APISO): 56 U/L (ref 33–130)
BUN: 17 mg/dL (ref 7–25)
CO2: 27 mmol/L (ref 20–32)
Calcium: 10 mg/dL (ref 8.6–10.4)
Chloride: 104 mmol/L (ref 98–110)
Creat: 0.85 mg/dL (ref 0.60–0.88)
GFR, Est African American: 74 mL/min/{1.73_m2} (ref >=60)
GFR, Est Non African American: 64 mL/min/{1.73_m2} (ref >=60)
Globulin: 2.9 g/dL (calc) (ref 1.9–3.7)
Glucose, Bld: 122 mg/dL — ABNORMAL HIGH (ref 65–99)
Potassium: 4.5 mmol/L (ref 3.5–5.3)
Sodium: 140 mmol/L (ref 135–146)
Total Bilirubin: 0.6 mg/dL (ref 0.2–1.2)
Total Protein: 7.6 g/dL (ref 6.1–8.1)

## 2017-05-31 LAB — LIPID PANEL
Cholesterol: 143 mg/dL (ref <200)
HDL: 55 mg/dL (ref >50)
LDL Cholesterol (Calc): 72 mg/dL (calc)
Non-HDL Cholesterol (Calc): 88 mg/dL (calc) (ref <130)
Total CHOL/HDL Ratio: 2.6 (calc) (ref <5.0)
Triglycerides: 84 mg/dL (ref <150)

## 2017-05-31 LAB — POCT GLYCOSYLATED HEMOGLOBIN (HGB A1C): Hemoglobin A1C: 6.4 — AB

## 2017-05-31 MED ORDER — LABETALOL HCL 100 MG PO TABS: 100.0000 mg | ORAL_TABLET | Freq: Three times a day (TID) | ORAL | 1 refills | Status: DC

## 2017-05-31 MED ORDER — SIMVASTATIN 20 MG PO TABS: 20.0000 mg | ORAL_TABLET | Freq: Every day | ORAL | 0 refills | Status: DC

## 2017-05-31 MED ORDER — PANTOPRAZOLE SODIUM 40 MG PO TBEC
40.0000 mg | DELAYED_RELEASE_TABLET | Freq: Every day | ORAL | 0 refills | Status: DC
Start: 2017-05-31 — End: 2017-05-31

## 2017-05-31 MED ORDER — SIMVASTATIN 20 MG PO TABS
20.0000 mg | ORAL_TABLET | Freq: Every day | ORAL | 3 refills | Status: DC
Start: 2017-05-31 — End: 2017-09-28

## 2017-05-31 MED ORDER — AMLODIPINE BESYLATE 10 MG PO TABS: 10.0000 mg | ORAL_TABLET | Freq: Every day | ORAL | 11 refills | Status: DC

## 2017-05-31 MED ORDER — LABETALOL HCL 100 MG PO TABS: 100.0000 mg | ORAL_TABLET | Freq: Three times a day (TID) | ORAL | 0 refills | Status: DC

## 2017-05-31 MED ORDER — LOSARTAN POTASSIUM 50 MG PO TABS: 50.0000 mg | ORAL_TABLET | Freq: Two times a day (BID) | ORAL | 1 refills | Status: DC

## 2017-05-31 MED ORDER — PANTOPRAZOLE SODIUM 40 MG PO TBEC: 40.0000 mg | DELAYED_RELEASE_TABLET | Freq: Every day | ORAL | 1 refills | Status: DC

## 2017-05-31 MED ORDER — METFORMIN HCL 500 MG PO TABS: 500.0000 mg | ORAL_TABLET | Freq: Two times a day (BID) | ORAL | 1 refills | Status: DC

## 2017-05-31 NOTE — Progress Notes (Signed)
Subjective:    Patient ID: Margaret Mcintosh, female    DOB: July 08, 1932, 84 y.o.   MRN: [id number]  Margaret Mcintosh is a 84 y.o. female presenting on 05/31/2017 for Establish Care   HPI Establish Care New Provider Pt last seen by PCP Dr. Baldwin Crown at [facility] primary care years ago.  Obtain records from care everywhere. -care team: Dentist: Deanna Artis  Diabetes Pt presents today for follow up of Type 2 diabetes mellitus. She is checking fasting am CBG at home with a range of 105-110, occasionally 125 - Current diabetic medications include: metformin - She is not currently symptomatic.  - She denies polydipsia, polyphagia, polyuria, headaches, diaphoresis, shakiness, chills, pain, numbness or tingling in extremities and changes in vision.   - Clinical course has been stable. - She  reports no regular exercise routine. - Her diet is moderate in salt, moderate in fat, and moderate in carbohydrates. - Weight trend: stable  PREVENTION: Eye exam current (within one year): yes - November 2018 - Dr. Brooke Dare [facility] --> Request record Foot exam current (within one year): yes  Lipid/ASCVD risk reduction - on statin: yes Kidney protection - on ace or arb: yes Recent Labs    05/31/17 1502  HGBA1C 6.4*    Hypertension - She is not checking BP at home or outside of clinic.    - Current medications: Amlodipine 10 mg once daily, labetalol 100 mg 3 times daily, losartan 50 mg twice daily, tolerating well without side effects - She is not currently symptomatic. - Pt denies headache, lightheadedness, dizziness, changes in vision, chest tightness/pressure, palpitations, leg swelling, sudden loss of speech or loss of consciousness.      Social History   Tobacco Use  . Smoking status: Never Smoker  . Smokeless tobacco: Never Used  Substance Use Topics  . Alcohol use: No  . Drug use: No    Review of Systems Per HPI unless specifically indicated above     Objective:    BP (!)  122/53 (BP Location: Right Arm, Patient Position: Sitting, Cuff Size: Normal)   Pulse (!) 57   Temp 97.9 F (36.6 C)   Ht 5\' 4"  (1.626 m)   Wt 176 lb (79.8 kg)   BMI 30.21 kg/m   Wt Readings from Last 3 Encounters:  06/12/17 177 lb (80.3 kg)  05/31/17 176 lb (79.8 kg)  01/17/17 178 lb (80.7 kg)    Physical Exam  Constitutional: She is oriented to person, place, and time. She appears well-developed and well-nourished. No distress.  Neck: Normal range of motion. Neck supple. Carotid bruit is not present.  Cardiovascular: Normal rate, regular rhythm, S1 normal, S2 normal, normal heart sounds and intact distal pulses.  Pulmonary/Chest: Effort normal and breath sounds normal. No respiratory distress.  Abdominal: Soft. Bowel sounds are normal. She exhibits no distension. There is no hepatosplenomegaly. There is no tenderness. No hernia.  Musculoskeletal: She exhibits no edema (pedal).  Neurological: She is alert and oriented to person, place, and time.  Skin: Skin is warm and dry.  Psychiatric: She has a normal mood and affect. Her behavior is normal.  Vitals reviewed.   Diabetic Foot Exam - Simple   Simple Foot Form Diabetic Foot exam was performed with the following findings:  Yes 05/31/2017  3:04 PM  Visual Inspection No deformities, no ulcerations, no other skin breakdown bilaterally:  Yes Sensation Testing Intact to touch and monofilament testing bilaterally:  Yes Pulse Check Posterior Tibialis and Dorsalis  pulse intact bilaterally:  Yes Comments     Results for orders placed or performed in visit on 05/31/17  Lipid panel  Result Value Ref Range   Cholesterol 143 <200 mg/dL   HDL 55 >29>50 mg/dL   Triglycerides 84 <562<150 mg/dL   LDL Cholesterol (Calc) 72 mg/dL (calc)   Total CHOL/HDL Ratio 2.6 <5.0 (calc)   Non-HDL Cholesterol (Calc) 88 <130<130 mg/dL (calc)  COMPLETE METABOLIC PANEL WITH GFR  Result Value Ref Range   Glucose, Bld 122 (H) 65 - 99 mg/dL   BUN 17 7 - 25 mg/dL    Creat 8.650.85 7.840.60 - 6.960.88 mg/dL   GFR, Est Non African American 64 > OR = 60 mL/min/1.4973m2   GFR, Est African American 74 > OR = 60 mL/min/1.6873m2   BUN/Creatinine Ratio NOT APPLICABLE 6 - 22 (calc)   Sodium 140 135 - 146 mmol/L   Potassium 4.5 3.5 - 5.3 mmol/L   Chloride 104 98 - 110 mmol/L   CO2 27 20 - 32 mmol/L   Calcium 10.0 8.6 - 10.4 mg/dL   Total Protein 7.6 6.1 - 8.1 g/dL   Albumin 4.7 3.6 - 5.1 g/dL   Globulin 2.9 1.9 - 3.7 g/dL (calc)   AG Ratio 1.6 1.0 - 2.5 (calc)   Total Bilirubin 0.6 0.2 - 1.2 mg/dL   Alkaline phosphatase (APISO) 56 33 - 130 U/L   AST 16 10 - 35 U/L   ALT 14 6 - 29 U/L  POCT HgB A1C  Result Value Ref Range   Hemoglobin A1C 6.4 (A)       Assessment & Plan:   Problem List Items Addressed This Visit      Cardiovascular and Mediastinum   Hypertension - Primary Well-controlled hypertension.  BP goal < 130/80.  Pt is working on lifestyle modifications.  Taking medications tolerating well without side effects. No currently identified complications.  Plan: 1. Continue taking all medications without changes. 2. Obtain labs CMP, lipid panel 3. Encouraged heart healthy diet and increasing exercise to 30 minutes most days of the week. 4. Check BP 1-2 x per week at home, keep log, and bring to clinic at next appointment. 5. Follow up 3 months.     Relevant Medications   losartan (COZAAR) 50 MG tablet   amLODipine (NORVASC) 10 MG tablet   labetalol (NORMODYNE) 100 MG tablet   simvastatin (ZOCOR) 20 MG tablet   Other Relevant Orders   Lipid panel (Completed)   COMPLETE METABOLIC PANEL WITH GFR (Completed)     Endocrine   Diabetes (HCC) Well-controlledDM with A1c 6.4%  stable from 6.4% August 2017 and goal A1c < 7.0%. - No known complications or hypoglycemia.  Plan:  1. Continue current therapy: Metformin 2. Encourage improved lifestyle: - low carb/low glycemic diet handout provided - Increase physical activity to 30 minutes most days of the week.   Explained that increased physical activity increases body's use of sugar for energy. 3. Check fasting am CBG.  Bring log to next visit for review 4. Continue ARB and Statin 5. Advised to schedule DM ophtho exam, send record.  Foot exam performed today with normal findings.  6. Follow-up 3 months   Relevant Medications   metFORMIN (GLUCOPHAGE) 500 MG tablet   losartan (COZAAR) 50 MG tablet   simvastatin (ZOCOR) 20 MG tablet   Other Relevant Orders   Lipid panel (Completed)   COMPLETE METABOLIC PANEL WITH GFR (Completed)   POCT HgB A1C (Completed)     Other  Hyperlipemia  Previously stable on last lab check.  No recent lab check in last 6 months.  Patient takes simvastatin without difficulties or side effects.  New  Plan: 1.  Continue simvastatin 20 mg once daily 2.  Recheck CMP lipid panel today 3.  Follow-up 3 months   Relevant Medications   losartan (COZAAR) 50 MG tablet   amLODipine (NORVASC) 10 MG tablet   labetalol (NORMODYNE) 100 MG tablet   simvastatin (ZOCOR) 20 MG tablet   Other Relevant Orders   Lipid panel (Completed)   COMPLETE METABOLIC PANEL WITH GFR (Completed)    Other Visit Diagnoses    Gastroesophageal reflux disease, esophagitis presence not specified    Stable symptoms on pantoprazole 40 mg once daily.  Patient is not having any breakthrough symptoms.  Requests refill today.  Plan: 1.  Continue pantoprazole 40 mg once daily. 2.  Follow-up 3 months or as needed.   Relevant Medications   pantoprazole (PROTONIX) 40 MG tablet      Meds ordered this encounter  Medications  . metFORMIN (GLUCOPHAGE) 500 MG tablet    Sig: Take 1 tablet (500 mg total) by mouth 2 (two) times daily with a meal.    Dispense:  180 tablet    Refill:  1    Order Specific Question:   Supervising Provider    Answer:   Smitty Cords [[id number]]  . losartan (COZAAR) 50 MG tablet    Sig: Take 1 tablet (50 mg total) by mouth 2 (two) times daily.    Dispense:  180 tablet     Refill:  1    Order Specific Question:   Supervising Provider    Answer:   Smitty Cords [[id number]]  . amLODipine (NORVASC) 10 MG tablet    Sig: Take 1 tablet (10 mg total) by mouth daily.    Dispense:  30 tablet    Refill:  11    Order Specific Question:   Supervising Provider    Answer:   Smitty Cords [[id number]]  . labetalol (NORMODYNE) 100 MG tablet    Sig: Take 1 tablet (100 mg total) by mouth 3 (three) times daily.    Dispense:  90 tablet    Refill:  1  . simvastatin (ZOCOR) 20 MG tablet    Sig: Take 1 tablet (20 mg total) by mouth at bedtime.    Dispense:  90 tablet    Refill:  3  . pantoprazole (PROTONIX) 40 MG tablet    Sig: Take 1 tablet (40 mg total) by mouth daily at 6 (six) AM.    Dispense:  90 tablet    Refill:  1    Follow up plan: Return in about 3 months (around 08/31/2017) for diabetes, hypertension.  Wilhelmina Mcardle, DNP, AGPCNP-BC Adult Gerontology Primary Care Nurse Practitioner [facility] 06/23/2017, 3:09 PM

## 2017-05-31 NOTE — Patient Instructions (Addendum)
[name], Thank you for coming in to clinic today.  1. Continue all medications without changes.  Things are going very well for your so far.  Please schedule a follow-up appointment with Wilhelmina McardleLauren Zoltan Genest, AGNP. Return in about 3 months (around 08/31/2017) for diabetes, hypertension.  If you have any other questions or concerns, please feel free to call the clinic or send a message through MyChart. You may also schedule an earlier appointment if necessary.  You will receive a survey after today's visit either digitally by e-mail or paper by [employer] mail. Your experiences and feedback matter to us.  Please respond so we know how we are doing as we provide care for you.   Wilhelmina McardleLauren Aadyn Buchheit, DNP, AGNP-BC Adult Gerontology Nurse Practitioner [facility], [facility]

## 2017-06-01 ENCOUNTER — Other Ambulatory Visit: Payer: Self-pay | Admitting: Nurse Practitioner

## 2017-06-04 ENCOUNTER — Encounter: Payer: Self-pay | Admitting: *Deleted

## 2017-06-04 ENCOUNTER — Other Ambulatory Visit: Payer: Self-pay

## 2017-06-04 ENCOUNTER — Telehealth: Payer: Self-pay

## 2017-06-04 NOTE — Telephone Encounter (Signed)
The pt was notified. No questions or concerns. 

## 2017-06-04 NOTE — Telephone Encounter (Signed)
-----   Message from Galen ManilaLauren Renee Kennedy, NP sent at 06/01/2017  8:21 AM EST ----- LIPID PANEL: Is normal and LDL is at goal. Continue simvastatin 20 mg once daily.  CMP: Your CMP is normal except slightly elevated fasting glucose which was expected.  Normal electrolytes, kidney function, and liver function.

## 2017-06-11 NOTE — Discharge Instructions (Signed)
INSTRUCTIONS FOLLOWING OCULOPLASTIC SURGERY °AMY M. FOWLER, MD ° °AFTER YOUR EYE SURGERY, THER ARE MANY THINGS THWIHC YOU, THE PATIENT, CAN DO TO ASSURE THE BEST POSSIBLE RESULT FROM YOUR OPERATION.  THIS SHEET SHOULD BE REFERRED TO WHENEVER QUESTIONS ARISE.  IF THERE ARE ANY QUESTIONS NOT ANSWERED HERE, DO NOT HESITATE TO CALL OUR OFFICE AT [phone] OR [phone].  THERE IS ALWAYS [facility] AVAILABLE TO CALL IF QUESTIONS OR PROBLEMS ARISE. ° °VISION: Your vision may be blurred and out of focus after surgery until you are able to stop using your ointment, swelling resolves and your eye(s) heal. This may take 1 to 2 weeks at the least.  If your vision becomes gradually more dim or dark, this is not normal and you need to call our office immediately. ° °EYE CARE: For the first 48 hours after surgery, use ice packs frequently - “20 minutes on, 20 minutes off” - to help reduce swelling and bruising.  Small bags of frozen peas or corn make good ice packs along with cloths soaked in ice water.  If you are wearing a patch or other type of dressing following surgery, keep this on for the amount of time specified by your doctor.  For the first week following surgery, you will need to treat your stitches with great care.  If is OK to shower, but take care to not allow soapy water to run into your eye(s) to help reduce changes of infection.  You may gently clean the eyelashes and around the eye(s) with cotton balls and sterile water, BUT DO NOT RUB THE STITCHES VIGOROUSLY.  Keeping your stitches moist with ointment will help promote healing with minimal scar formation. ° °ACTIVITY: When you leave the surgery center, you should go home, rest and be inactive.  The eye(s) may feel scratchy and keeping the eyes closed will allow for faster healing.  The first week following surgery, avoid straining (anything making the face turn red) or lifting over 20 pounds.  Additionally, avoid bending which causes your head to go below  your waist.  Using your eyes will NOT harm them, so feel free to read, watch television, use the computer, etc as desired.  Driving depends on each individual, so check with your doctor if you have questions about driving. ° °MEDICATIONS:  You will be given a prescription for an ointment to use 4 times a day on your stitches.  You can use the ointment in your eyes if they feel scratchy or irritated.  If you eyelid(s) don’t close completely when you sleep, put some ointment in your eyes before bedtime. ° °EMERGENCY: If you experience SEVERE EYE PAIN OR HEADACHE UNRELIEVED BY TYLENOL OR PERCOCET, NAUSEA OR VOMITING, WORSENING REDNESS, OR WORSENING VISION (ESPECIALLY VISION THAT WA INITIALLY BETTER) CALL [phone] OR [phone] DURING BUSINESS HOURS OR AFTER HOURS. ° °General Anesthesia, Adult, Care After °These instructions provide you with information about caring for yourself after your procedure. Your health care provider may also give you more specific instructions. Your treatment has been planned according to current medical practices, but problems sometimes occur. Call your health care provider if you have any problems or questions after your procedure. °What can I expect after the procedure? °After the procedure, it is common to have: °· Vomiting. °· A sore throat. °· Mental slowness. ° °It is common to feel: °· Nauseous. °· Cold or shivery. °· Sleepy. °· Tired. °· Sore or achy, even in parts of your body where you did not have surgery. ° °  Follow these instructions at home: °For at least 24 hours after the procedure: °· Do not: °? Participate in activities where you could fall or become injured. °? Drive. °? Use heavy machinery. °? Drink alcohol. °? Take sleeping pills or medicines that cause drowsiness. °? Make important decisions or sign legal documents. °? Take care of children on your own. °· Rest. °Eating and drinking °· If you vomit, drink water, juice, or soup when you can drink without  vomiting. °· Drink enough fluid to keep your urine clear or pale yellow. °· Make sure you have little or no nausea before eating solid foods. °· Follow the diet recommended by your health care provider. °General instructions °· Have a responsible adult stay with you until you are awake and alert. °· Return to your normal activities as told by your health care provider. Ask your health care provider what activities are safe for you. °· Take over-the-counter and prescription medicines only as told by your health care provider. °· If you smoke, do not smoke without supervision. °· Keep all follow-up visits as told by your health care provider. This is important. °Contact a health care provider if: °· You continue to have nausea or vomiting at home, and medicines are not helpful. °· You cannot drink fluids or start eating again. °· You cannot urinate after 8-12 hours. °· You develop a skin rash. °· You have fever. °· You have increasing redness at the site of your procedure. °Get help right away if: °· You have difficulty breathing. °· You have chest pain. °· You have unexpected bleeding. °· You feel that you are having a life-threatening or urgent problem. °This information is not intended to replace advice given to you by your health care provider. Make sure you discuss any questions you have with your health care provider. °Document Released: 06/19/2000 Document Revised: 08/16/2015 Document Reviewed: 02/25/2015 °Elsevier Interactive Patient Education © 2018 Else[employer] Inc. ° °

## 2017-06-12 ENCOUNTER — Encounter
Admission: RE | Disposition: A | Discharge status: Home / Self Care | Payer: Self-pay | Source: Ambulatory Visit | Attending: Ophthalmology

## 2017-06-12 ENCOUNTER — Ambulatory Visit: Payer: PPO | Admitting: Student in an Organized Health Care Education/Training Program

## 2017-06-12 ENCOUNTER — Ambulatory Visit
Admission: RE | Admit: 2017-06-12 | Discharge: 2017-06-12 | Disposition: A | Discharge status: Home / Self Care | Payer: PPO | Source: Ambulatory Visit | Attending: Ophthalmology | Admitting: Ophthalmology

## 2017-06-12 DIAGNOSIS — Z7984 Long term (current) use of oral hypoglycemic drugs: Secondary | ICD-10-CM | POA: Insufficient documentation

## 2017-06-12 DIAGNOSIS — I10 Essential (primary) hypertension: Secondary | ICD-10-CM | POA: Insufficient documentation

## 2017-06-12 DIAGNOSIS — I69354 Hemiplegia and hemiparesis following cerebral infarction affecting left non-dominant side: Secondary | ICD-10-CM | POA: Diagnosis not present

## 2017-06-12 DIAGNOSIS — E7800 Pure hypercholesterolemia, unspecified: Secondary | ICD-10-CM | POA: Diagnosis not present

## 2017-06-12 DIAGNOSIS — Z7982 Long term (current) use of aspirin: Secondary | ICD-10-CM | POA: Insufficient documentation

## 2017-06-12 DIAGNOSIS — Z885 Allergy status to narcotic agent status: Secondary | ICD-10-CM | POA: Diagnosis not present

## 2017-06-12 DIAGNOSIS — H02831 Dermatochalasis of right upper eyelid: Secondary | ICD-10-CM | POA: Insufficient documentation

## 2017-06-12 DIAGNOSIS — H02834 Dermatochalasis of left upper eyelid: Secondary | ICD-10-CM | POA: Insufficient documentation

## 2017-06-12 DIAGNOSIS — E119 Type 2 diabetes mellitus without complications: Secondary | ICD-10-CM | POA: Diagnosis not present

## 2017-06-12 DIAGNOSIS — Z888 Allergy status to other drugs, medicaments and biological substances status: Secondary | ICD-10-CM | POA: Diagnosis not present

## 2017-06-12 DIAGNOSIS — Z79899 Other long term (current) drug therapy: Secondary | ICD-10-CM | POA: Insufficient documentation

## 2017-06-12 DIAGNOSIS — K219 Gastro-esophageal reflux disease without esophagitis: Secondary | ICD-10-CM | POA: Insufficient documentation

## 2017-06-12 HISTORY — PX: BROW LIFT: SHX178

## 2017-06-12 LAB — GLUCOSE, CAPILLARY
GLUCOSE-CAPILLARY: 155 mg/dL — AB (ref 65–99)
Glucose-Capillary: 126 mg/dL — ABNORMAL HIGH (ref 65–99)

## 2017-06-12 SURGERY — BLEPHAROPLASTY
Anesthesia: General | Site: Eye | Laterality: Bilateral | Wound class: Clean

## 2017-06-12 MED ORDER — ALFENTANIL 500 MCG/ML IJ INJ
INJECTION | INTRAVENOUS | Status: DC | PRN
Start: 2017-06-12 — End: 2017-06-12
  Administered 2017-06-12: 300 ug via INTRAVENOUS
  Administered 2017-06-12: 200 ug via INTRAVENOUS

## 2017-06-12 MED ORDER — BSS IO SOLN
INTRAOCULAR | Status: DC | PRN
  Administered 2017-06-12: 15 mL via INTRAOCULAR

## 2017-06-12 MED ORDER — MIDAZOLAM HCL 2 MG/2ML IJ SOLN
INTRAMUSCULAR | Status: DC | PRN
  Administered 2017-06-12 (×2): 1 mg via INTRAVENOUS

## 2017-06-12 MED ORDER — LIDOCAINE-EPINEPHRINE 2 %-1:100000 IJ SOLN
INTRAMUSCULAR | Status: DC | PRN
  Administered 2017-06-12: 3 mL via OPHTHALMIC

## 2017-06-12 MED ORDER — LACTATED RINGERS IV SOLN
INTRAVENOUS | Status: DC
  Administered 2017-06-12: 07:00:00 via INTRAVENOUS

## 2017-06-12 MED ORDER — TRAMADOL HCL 50 MG PO TABS: ORAL_TABLET | ORAL | 0 refills | Status: DC

## 2017-06-12 MED ORDER — ONDANSETRON HCL 4 MG/2ML IJ SOLN
INTRAMUSCULAR | Status: DC | PRN
  Administered 2017-06-12: 4 mg via INTRAVENOUS

## 2017-06-12 MED ORDER — TETRACAINE HCL 0.5 % OP SOLN
OPHTHALMIC | Status: DC | PRN
  Administered 2017-06-12: 2 [drp] via OPHTHALMIC

## 2017-06-12 MED ORDER — ERYTHROMYCIN 5 MG/GM OP OINT
TOPICAL_OINTMENT | OPHTHALMIC | Status: DC | PRN
  Administered 2017-06-12: 1

## 2017-06-12 MED ORDER — ERYTHROMYCIN 5 MG/GM OP OINT
TOPICAL_OINTMENT | OPHTHALMIC | 3 refills | Status: DC
Start: 2017-06-12 — End: 2017-09-28

## 2017-06-12 SURGICAL SUPPLY — 35 items
APPLICATOR COTTON TIP WD 3 STR (MISCELLANEOUS) ×3 IMPLANT
BLADE SURG 15 STRL LF DISP TIS (BLADE) ×1 IMPLANT
BLADE SURG 15 STRL SS (BLADE) ×2
CORD BIP STRL DISP 12FT (MISCELLANEOUS) ×3 IMPLANT
DRAPE HEAD BAR (DRAPES) ×3 IMPLANT
GAUZE SPONGE 4X4 12PLY STRL (GAUZE/BANDAGES/DRESSINGS) ×3 IMPLANT
GAUZE SPONGE NON-WVN 2X2 STRL (MISCELLANEOUS) ×10 IMPLANT
GLOVE SURG LX 7.0 MICRO (GLOVE) ×4
GLOVE SURG LX STRL 7.0 MICRO (GLOVE) ×2 IMPLANT
MARKER SKIN XFINE TIP W/RULER (MISCELLANEOUS) ×3 IMPLANT
NEEDLE FILTER BLUNT 18X 1/2SAF (NEEDLE) ×2
NEEDLE FILTER BLUNT 18X1 1/2 (NEEDLE) ×1 IMPLANT
NEEDLE HYPO 30X.5 LL (NEEDLE) ×6 IMPLANT
PACK DRAPE NASAL/ENT (PACKS) ×3 IMPLANT
SOL PREP PVP 2OZ (MISCELLANEOUS) ×3
SOLUTION PREP PVP 2OZ (MISCELLANEOUS) ×1 IMPLANT
SPONGE VERSALON 2X2 STRL (MISCELLANEOUS) ×20
SUT CHROMIC 4-0 (SUTURE)
SUT CHROMIC 4-0 M2 12X2 ARM (SUTURE)
SUT CHROMIC 5 0 P 3 (SUTURE) IMPLANT
SUT ETHILON 4 0 CL P 3 (SUTURE) IMPLANT
SUT MERSILENE 4-0 S-2 (SUTURE) IMPLANT
SUT PLAIN GUT (SUTURE) ×3 IMPLANT
SUT PROLENE 5 0 P 3 (SUTURE) IMPLANT
SUT PROLENE 6 0 P 1 18 (SUTURE) IMPLANT
SUT SILK 4 0 G 3 (SUTURE) IMPLANT
SUT VIC AB 5-0 P-3 18X BRD (SUTURE) IMPLANT
SUT VIC AB 5-0 P3 18 (SUTURE)
SUT VICRYL 6-0  S14 CTD (SUTURE)
SUT VICRYL 6-0 S14 CTD (SUTURE) IMPLANT
SUT VICRYL 7 0 TG140 8 (SUTURE) IMPLANT
SUTURE CHRMC 4-0 M2 12X2 ARM (SUTURE) IMPLANT
SYR 3ML LL SCALE MARK (SYRINGE) ×3 IMPLANT
SYRINGE 10CC LL (SYRINGE) ×3 IMPLANT
WATER STERILE IRR 250ML POUR (IV SOLUTION) ×3 IMPLANT

## 2017-06-12 NOTE — Transfer of Care (Signed)
Immediate Anesthesia Transfer of Care Note  Patient: Margaret LawlessShelvia S Mcintosh  Procedure(s) Performed: BLEPHAROPLASTY UPPER EYELID WITH EXCESS SKIN DIABETIC (Bilateral Eye)  Patient Location: PACU  Anesthesia Type: General  Level of Consciousness: awake, alert  and patient cooperative  Airway and Oxygen Therapy: Patient Spontanous Breathing and Patient connected to supplemental oxygen  Post-op Assessment: Post-op Vital signs reviewed, Patient's Cardiovascular Status Stable, Respiratory Function Stable, Patent Airway and No signs of Nausea or vomiting  Post-op Vital Signs: Reviewed and stable  Complications: No apparent anesthesia complications

## 2017-06-12 NOTE — Interval H&P Note (Signed)
History and Physical Interval Note:  06/12/2017 7:35 AM  Margaret Mcintosh  has presented today for surgery, with the diagnosis of H02.831 H02.834 DERMATOCHALASIS  The various methods of treatment have been discussed with the patient and family. After consideration of risks, benefits and other options for treatment, the patient has consented to  Procedure(s) with comments: BLEPHAROPLASTY UPPER EYELID WITH EXCESS SKIN DIABETIC (Bilateral) - Diabetic - oral meds as a surgical intervention .  The patient's history has been reviewed, patient examined, no change in status, stable for surgery.  I have reviewed the patient's chart and labs.  Questions were answered to the patient's satisfaction.     Ether GriffinsFowler, Chancy Claros M

## 2017-06-12 NOTE — Anesthesia Preprocedure Evaluation (Signed)
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; residual left-sided weakness (ambulates independently))    GI/Hepatic GERD  ,  Endo/Other  diabetes, Type 2  Renal/GU negative Renal ROS     Musculoskeletal   Abdominal   Peds  Hematology negative hematology ROS (+)   Anesthesia Other Findings   Reproductive/Obstetrics                             Anesthesia Physical Anesthesia Plan  ASA: II  Anesthesia Plan: General   Post-op Pain Management:    Induction: Intravenous  PONV Risk Score and Plan: 2 and Propofol infusion and TIVA  Airway Management Planned: Natural Airway  Additional Equipment:   Intra-op Plan:   Post-operative Plan:   Informed Consent: I have reviewed the patients History and Physical, chart, labs and discussed the procedure including the risks, benefits and alternatives for the proposed anesthesia with the patient or authorized representative who has indicated his/her understanding and acceptance.     Plan Discussed with: CRNA  Anesthesia Plan Comments:         Anesthesia Quick Evaluation

## 2017-06-12 NOTE — Anesthesia Postprocedure Evaluation (Signed)
Anesthesia Post Note  Patient: Margaret Mcintosh  Procedure(s) Performed: BLEPHAROPLASTY UPPER EYELID WITH EXCESS SKIN DIABETIC (Bilateral Eye)  Patient location during evaluation: PACU Anesthesia Type: General Level of consciousness: awake and alert, oriented and patient cooperative Pain management: pain level controlled Vital Signs Assessment: post-procedure vital signs reviewed and stable Respiratory status: spontaneous breathing, nonlabored ventilation and respiratory function stable Cardiovascular status: blood pressure returned to baseline and stable Postop Assessment: adequate PO intake Anesthetic complications: no    Reed BreechAndrea Adaia Matthies

## 2017-06-12 NOTE — H&P (Signed)
See the history and physical completed at [facility] on 05/28/17 and scanned into the chart.

## 2017-06-12 NOTE — Op Note (Signed)
Preoperative Diagnosis:  Visually significant dermatochalasis bilateral upper Eyelid(s)  Postoperative Diagnosis:  Same.  Procedure(s) Performed:   Upper eyelid blepharoplasty with excess skin excision bilateral upper Eyelid(s)  Teaching Surgeon: Philis Pique. Vickki Muff, M.D.  Assistants: none  Anesthesia: MAC  Specimens: None.  Estimated Blood Loss: Minimal.  Complications: None.  Operative Findings: None Dictated  Procedure:   Allergies were reviewed and the patient is allergic to Codeine; Diphenhydramine hcl; and Other.   After the risks, benefits, complications and alternatives were discussed with the patient, appropriate informed consent was obtained and the patient was brought to the operating suite. The patient was reclined supine and a timeout was conducted.  The patient was then sedated.  Local anesthetic consisting of a 50-50 mixture of 2% lidocaine with epinephrine and 0.75% bupivacaine with added Hylenex was injected subcutaneously to both upper eyelid(s). After adequate local was instilled, the patient was prepped and draped in the usual sterile fashion for eyelid surgery.   Attention was turned to the upper eyelids. A 59m upper eyelid crease incision line was marked with calipers on both upper eyelid(s).  A pinch test was used to estimate the amount of excess skin to remove and this was marked in standard blepharoplasty style fashion.  Due to significant redundancy medially, a small Burrow's triangle was also fashioned.  Attention was turned to the right upper eyelid. A #15 blade was used to open the premarked incision line. A skin and muscle flap was excised and hemostasis was obtained with bipolar cautery.   A buttonhole was created medially in orbicularis and orbital septum to reveal the medial fat pocket. This was dissected free from fascial attachments, cauterized towards the pedicle base and excised to produce a nice flattening of the medial corner of the upper  eyelid.  Attention was then turned to the opposite eyelid where the same procedure was performed in the same manner. Hemostasis was obtained with bipolar cautery throughout. All incisions were then closed with a combination of running and interrupted 6-0 fast absorbing plain suture. The patient tolerated the procedure well.  Erythromycin ophthalmic ointment was applied to her incision sites, followed by ice packs. She was taken to the recovery area where she recovered without difficulty.  Post-Op Plan/Instructions:  The patient was instructed to use ice packs frequently for the next 48 hours. She was instructed to use erythromycin ophthalmic ointment on her incisions 4 times a day for the next 12 to 14 days. She was given a prescription for Percocet for pain control should Tylenol not be effective. [facility] [place] needed [place] problems.  Brandan Robicheaux M. FVickki Muff M.D. Attending,Ophthalmology

## 2017-06-13 ENCOUNTER — Encounter: Payer: Self-pay | Admitting: Ophthalmology

## 2017-06-23 ENCOUNTER — Encounter: Payer: Self-pay | Admitting: Nurse Practitioner

## 2017-09-03 ENCOUNTER — Ambulatory Visit: Payer: PPO | Admitting: Nurse Practitioner

## 2017-09-28 ENCOUNTER — Encounter: Payer: Self-pay | Admitting: Nurse Practitioner

## 2017-09-28 ENCOUNTER — Ambulatory Visit (INDEPENDENT_AMBULATORY_CARE_PROVIDER_SITE_OTHER): Payer: PPO | Admitting: Nurse Practitioner

## 2017-09-28 VITALS — BP 135/63 | HR 62 | Resp 16 | Ht 64.0 in | Wt 177.0 lb

## 2017-09-28 DIAGNOSIS — E119 Type 2 diabetes mellitus without complications: Secondary | ICD-10-CM

## 2017-09-28 DIAGNOSIS — K219 Gastro-esophageal reflux disease without esophagitis: Secondary | ICD-10-CM | POA: Diagnosis not present

## 2017-09-28 DIAGNOSIS — E782 Mixed hyperlipidemia: Secondary | ICD-10-CM | POA: Diagnosis not present

## 2017-09-28 DIAGNOSIS — I10 Essential (primary) hypertension: Secondary | ICD-10-CM

## 2017-09-28 LAB — POCT GLYCOSYLATED HEMOGLOBIN (HGB A1C): Hemoglobin A1C: 6.4 % — AB (ref 4.0–5.6)

## 2017-09-28 MED ORDER — PANTOPRAZOLE SODIUM 40 MG PO TBEC: 40.0000 mg | DELAYED_RELEASE_TABLET | Freq: Every day | ORAL | 1 refills | Status: DC

## 2017-09-28 MED ORDER — LOSARTAN POTASSIUM 50 MG PO TABS: 50.0000 mg | ORAL_TABLET | Freq: Two times a day (BID) | ORAL | 1 refills | Status: DC

## 2017-09-28 MED ORDER — AMLODIPINE BESYLATE 10 MG PO TABS: 10.0000 mg | ORAL_TABLET | Freq: Every day | ORAL | 11 refills | Status: DC

## 2017-09-28 MED ORDER — METFORMIN HCL 500 MG PO TABS: 500.0000 mg | ORAL_TABLET | Freq: Two times a day (BID) | ORAL | 1 refills | Status: DC

## 2017-09-28 MED ORDER — SIMVASTATIN 20 MG PO TABS: 20.0000 mg | ORAL_TABLET | Freq: Every day | ORAL | 1 refills | Status: DC

## 2017-09-28 MED ORDER — LABETALOL HCL 100 MG PO TABS: 100.0000 mg | ORAL_TABLET | Freq: Three times a day (TID) | ORAL | 1 refills | Status: DC

## 2017-09-28 NOTE — Progress Notes (Signed)
Subjective:    Patient ID: Margaret Mcintosh, female    DOB: 1933/03/24, 84 y.o.   MRN: [id number]  Margaret Mcintosh is a 84 y.o. female presenting on 09/28/2017 for Diabetes   HPI Diabetes Pt presents today for follow up of Type 2 diabetes mellitus. - Current diabetic medications include: metformin - She is not currently symptomatic.    - Clinical course has been stable. - Weight trend: stable  Recent Labs    05/31/17 1502 09/28/17 0847  HGBA1C 6.4* 6.4*     Urinary Hesitancy Urinary hesitancy worsened in last 2-3 weeks.  Patient describes this as having returned, but patient admits no recent problems with this - only in remote past.    Patient states this is an urge to void with inability to void immediately after getting to the bathroom.  Symptoms worsen when she is busier and doesn't stop to void as often. - Nocturia 2-3 times at night, but goes back to sleep quickly - No dysuria, urinary incontinence. - She is voiding at least every 3 hours now.   GERD Patient reports significant improvement in heartburn symptoms on pantoprazole.  She takes this before breakfast every day.  Rare breakthrough symptoms.  She does have symptoms if she is not taking this medication.  Social History   Tobacco Use  . Smoking status: Never Smoker  . Smokeless tobacco: Never Used  Substance Use Topics  . Alcohol use: No  . Drug use: No    Review of Systems Per HPI unless specifically indicated above     Objective:    BP 135/63   Pulse 62   Resp 16   Ht 5\' 4"  (1.626 m)   Wt 177 lb (80.3 kg)   SpO2 98%   BMI 30.38 kg/m   Wt Readings from Last 3 Encounters:  09/28/17 177 lb (80.3 kg)  06/12/17 177 lb (80.3 kg)  05/31/17 176 lb (79.8 kg)    Physical Exam  Constitutional: She is oriented to person, place, and time. She appears well-developed and well-nourished. No distress.  HENT:  Head: Normocephalic and atraumatic.  Neck: Normal range of motion. Neck supple. No JVD present.  Carotid bruit is not present. No tracheal deviation present.  Cardiovascular: Normal rate, regular rhythm, S1 normal, S2 normal, normal heart sounds and intact distal pulses.  Pulmonary/Chest: Effort normal and breath sounds normal. No respiratory distress.  Abdominal: Soft. Bowel sounds are normal. She exhibits no distension. There is no hepatosplenomegaly. There is no tenderness. No hernia.  Musculoskeletal: She exhibits edema (Trace pedal edema (non-pitting)).  Neurological: She is alert and oriented to person, place, and time. Gait normal.  Skin: Skin is warm and dry. Capillary refill takes less than 2 seconds.  Psychiatric: She has a normal mood and affect. Her behavior is normal. Judgment and thought content normal.  Vitals reviewed.   Results for orders placed or performed during the hospital encounter of 06/12/17  Glucose, capillary  Result Value Ref Range   Glucose-Capillary 126 (H) 65 - 99 mg/dL  Glucose, capillary  Result Value Ref Range   Glucose-Capillary 155 (H) 65 - 99 mg/dL      Assessment & Plan:   Problem List Items Addressed This Visit      Cardiovascular and Mediastinum   Hypertension - STABLE not at goal < 130/80, but in range. No changes to medications.  Followup with labs at next visit.  OV in 6 months.   Relevant Medications   amLODipine (NORVASC) 10  MG tablet   labetalol (NORMODYNE) 100 MG tablet   losartan (COZAAR) 50 MG tablet   simvastatin (ZOCOR) 20 MG tablet     Endocrine   Diabetes (HCC) - Primary  - Controlled.  No changes to medications.  Followup with labs at next visit.  OV in 6 months.   Relevant Medications   losartan (COZAAR) 50 MG tablet   metFORMIN (GLUCOPHAGE) 500 MG tablet   simvastatin (ZOCOR) 20 MG tablet   Other Relevant Orders   POCT HgB A1C (Completed)     Other   Hyperlipemia  Current status unknown.  Recheck labs at next appointment.  Continue meds without changes today.  Refills provided. Followup 6 months   Relevant  Medications   amLODipine (NORVASC) 10 MG tablet   labetalol (NORMODYNE) 100 MG tablet   losartan (COZAAR) 50 MG tablet   simvastatin (ZOCOR) 20 MG tablet    Other Visit Diagnoses    Gastroesophageal reflux disease, esophagitis presence not specified     STABLE. No changes to medications.  Followup with labs at next visit.  OV in 6 months.   Relevant Medications   pantoprazole (PROTONIX) 40 MG tablet       Meds ordered this encounter  Medications  . amLODipine (NORVASC) 10 MG tablet    Sig: Take 1 tablet (10 mg total) by mouth daily.    Dispense:  30 tablet    Refill:  11    Order Specific Question:   Supervising Provider    Answer:   Smitty Cords [[id number]]  . labetalol (NORMODYNE) 100 MG tablet    Sig: Take 1 tablet (100 mg total) by mouth 3 (three) times daily.    Dispense:  90 tablet    Refill:  1    Order Specific Question:   Supervising Provider    Answer:   Smitty Cords [[id number]]  . losartan (COZAAR) 50 MG tablet    Sig: Take 1 tablet (50 mg total) by mouth 2 (two) times daily.    Dispense:  180 tablet    Refill:  1    Order Specific Question:   Supervising Provider    Answer:   Smitty Cords [[id number]]  . metFORMIN (GLUCOPHAGE) 500 MG tablet    Sig: Take 1 tablet (500 mg total) by mouth 2 (two) times daily with a meal.    Dispense:  180 tablet    Refill:  1    Order Specific Question:   Supervising Provider    Answer:   Smitty Cords [[id number]]  . pantoprazole (PROTONIX) 40 MG tablet    Sig: Take 1 tablet (40 mg total) by mouth daily at 6 (six) AM.    Dispense:  90 tablet    Refill:  1    Order Specific Question:   Supervising Provider    Answer:   Smitty Cords [[id number]]  . simvastatin (ZOCOR) 20 MG tablet    Sig: Take 1 tablet (20 mg total) by mouth at bedtime.    Dispense:  90 tablet    Refill:  1    Order Specific Question:   Supervising Provider    Answer:   Smitty Cords [[id number]]    Follow up  plan: Return in about 6 months (around 03/31/2018) for hypertension, diabetes AND in October for [facility].  Margaret Mcardle, DNP, AGPCNP-BC Adult Gerontology Primary Care Nurse Practitioner [facility] 09/28/2017, 8:46 AM

## 2017-09-28 NOTE — Patient Instructions (Addendum)
Sheran LawlessShelvia S Cephas,   Thank you for coming in to clinic today.  1. Great work with [employer] Holdingsyour diet and activity.  2. No changes to medications.  3. You will be due for FASTING BLOOD WORK.  This means you should eat no food or drink after midnight.  Drink only water or coffee without cream/sugar on the morning of your lab visit. - Please go ahead and schedule a "Lab Only" visit in the morning at the clinic for lab draw at your next office visit. - Your results will be available about 2-3 days after blood draw.  If you have set up a MyChart account, you can can log in to MyChart online to view your results and a brief explanation. Also, we can discuss your results together at your next office visit if you would like.   Please schedule a follow-up appointment with Wilhelmina McardleLauren Namiah Dunnavant, AGNP. Return in about 6 months (around 03/31/2018) for hypertension, diabetes.   If you have any other questions or concerns, please feel free to call the clinic or send a message through MyChart. You may also schedule an earlier appointment if necessary.  You will receive a survey after today's visit either digitally by e-mail or paper by [employer] mail. Your experiences and feedback matter to us.  Please respond so we know how we are doing as we provide care for you.   Wilhelmina McardleLauren Maleah Rabago, DNP, AGNP-BC Adult Gerontology Nurse Practitioner [facility], [facility]

## 2017-10-25 ENCOUNTER — Ambulatory Visit (INDEPENDENT_AMBULATORY_CARE_PROVIDER_SITE_OTHER): Payer: PPO | Admitting: Nurse Practitioner

## 2017-10-25 ENCOUNTER — Encounter: Payer: Self-pay | Admitting: Nurse Practitioner

## 2017-10-25 VITALS — BP 146/54 | HR 60 | Temp 97.4°F | Ht 64.0 in | Wt 175.8 lb

## 2017-10-25 DIAGNOSIS — M25562 Pain in left knee: Secondary | ICD-10-CM

## 2017-10-25 DIAGNOSIS — M25572 Pain in left ankle and joints of left foot: Secondary | ICD-10-CM

## 2017-10-25 DIAGNOSIS — M25552 Pain in left hip: Secondary | ICD-10-CM

## 2017-10-25 DIAGNOSIS — I6930 Unspecified sequelae of cerebral infarction: Secondary | ICD-10-CM

## 2017-10-25 DIAGNOSIS — M79605 Pain in left leg: Secondary | ICD-10-CM

## 2017-10-25 MED ORDER — PREDNISONE 10 MG PO TABS: ORAL_TABLET | ORAL | 0 refills | Status: DC

## 2017-10-25 NOTE — Progress Notes (Signed)
Subjective:    Patient ID: Margaret Mcintosh, female    DOB: January 10, 1933, 84 y.o.   MRN: [id number]  Margaret Mcintosh is a 84 y.o. female presenting on 10/25/2017 for Leg Pain (LEFT: constant ache , pt states bearing weight makes the pain worse. She doesn't recall injuring the leg. x 1 day)   HPI LEFT Leg pain Constant aching leg pain over last 1 day.  No known injury.  Worsening leg pain with weight bearing.  She has not had this pain ever in past.  No leg swelling - patient notes swelling is less than usual.  Pain is tolerable when not weight bearing.  Increases to 8/10 with associated nausea when standing. - Had a stroke 2 years ago affecting left side and is having residual left leg weakness. - Patient admits to working in her garden on Friday, cleaning a room out on Saturday, vacuuming on Saturday.  This is more activity than she is used to performing on consecutive days. - Medications: She took 2 tylenol yesterday - Minimal relief.; Last night 2 ibuprofen and this morning. - Helped some for allowing sleep.  Also elevated leg on pillow.    Social History   Tobacco Use  . Smoking status: Never Smoker  . Smokeless tobacco: Never Used  Substance Use Topics  . Alcohol use: No  . Drug use: No    Review of Systems Per HPI unless specifically indicated above     Objective:    BP (!) 146/54 (BP Location: Right Arm, Patient Position: Sitting, Cuff Size: Normal)   Pulse 60   Temp (!) 97.4 F (36.3 C) (Oral)   Ht 5\' 4"  (1.626 m)   Wt 175 lb 12.8 oz (79.7 kg)   BMI 30.18 kg/m   Wt Readings from Last 3 Encounters:  10/25/17 175 lb 12.8 oz (79.7 kg)  09/28/17 177 lb (80.3 kg)  06/12/17 177 lb (80.3 kg)    Physical Exam  Constitutional: She is oriented to person, place, and time. She appears well-developed and well-nourished. No distress.  HENT:  Head: Normocephalic and atraumatic.  Musculoskeletal:       Right hip: Normal.       Left hip: She exhibits decreased range of motion  (Pain with full flex/extension/abduction/adduction/internal and external rotation.  ROM remains full, but pain occurs at limits.) and decreased strength.       Right knee: Normal.       Left knee: She exhibits swelling (mild swelling > R knee). She exhibits normal range of motion, no effusion, no ecchymosis, no deformity, no laceration, no erythema, normal alignment, no LCL laxity, normal patellar mobility, no bony tenderness, normal meniscus (Normal varus, valgus, Lachmann) and no MCL laxity. Tenderness found. Medial joint line and MCL tenderness noted. No lateral joint line, no LCL and no patellar tendon tenderness noted.       Right ankle: Normal.       Left ankle: She exhibits swelling (mild swelling > R ankle). She exhibits normal range of motion, no ecchymosis, no deformity, no laceration and normal pulse. Tenderness. Lateral malleolus (screw in place from prior injury) tenderness found. No medial malleolus, no AITFL, no CF ligament, no posterior TFL, no head of 5th metatarsal and no proximal fibula tenderness found. Achilles tendon normal.  Patient very stoic with pain.  LEFT hip, knee, ankle joints tender with increased pressure inside joint with palpation.  Neurological: She is alert and oriented to person, place, and time.  Skin: Skin is  warm and dry.  Psychiatric: She has a normal mood and affect. Her speech is normal and behavior is normal. Thought content normal.  Vitals reviewed.    Results for orders placed or performed in visit on 09/28/17  POCT HgB A1C  Result Value Ref Range   Hemoglobin A1C 6.4 (A) 4.0 - 5.6 %   HbA1c POC (<> result, manual entry)  4.0 - 5.6 %   HbA1c, POC (prediabetic range)  5.7 - 6.4 %   HbA1c, POC (controlled diabetic range)  0.0 - 7.0 %      Assessment & Plan:   Problem List Items Addressed This Visit      Other   History of stroke with current residual effects    Other Visit Diagnoses    Left leg pain    -  Primary   Relevant Medications    predniSONE (DELTASONE) 10 MG tablet   Arthralgia of left hip       Relevant Medications   predniSONE (DELTASONE) 10 MG tablet   Arthralgia of left knee       Relevant Medications   predniSONE (DELTASONE) 10 MG tablet   Arthralgia of left ankle       Relevant Medications   predniSONE (DELTASONE) 10 MG tablet     Acute left leg pain with arthralgias of hip, knee, ankle.  No prior experience in past.  Pain worse upon standing.  No evidence of injury other than likely overuse and possible muscle strain. Pain likely self-limited.    Plan:  1. Treat with Start prednisone taper over 7 days Day 1-2: 60 mg, Day 3: 50 mg, Day 4: 40 mg; Day 5: 30 mg; Day 6 20 mg; Day 7: 10 mg then stop. - May also use OTC pain meds - acetaminophen only while on prednisone.  After prednisone, may add ibuprofen.  Discussed alternate dosing and max dosing. 2. Apply heat and/or ice to affected area. 3. May also apply a muscle rub with lidocaine or lidocaine patch after heat or ice. 4. Consider joint Xrays if pain persists.  May also resume PT for strengthening if pain persists or recurs.  May need gait asssessment.  Cautioned drowsiness. 5. Follow up 2-4 weeks prn.    Meds ordered this encounter  Medications  . predniSONE (DELTASONE) 10 MG tablet    Sig: Day 1-2 take 6 pills. Day 3 take 5 pills then reduce by 1 pill each day.    Dispense:  27 tablet    Refill:  0    Order Specific Question:   Supervising Provider    Answer:   Smitty CordsKARAMALEGOS, ALEXANDER J [[id number]]    Follow up plan: Return 1-2 weeks if symptoms worsen or fail to improve.  Wilhelmina McardleLauren Raianna Slight, DNP, AGPCNP-BC Adult Gerontology Primary Care Nurse Practitioner [facility] 10/25/2017, 10:05 AM

## 2017-10-25 NOTE — Patient Instructions (Addendum)
Margaret LawlessShelvia S Mcintosh,   Thank you for coming in to clinic today.  1. You have a left leg muscle strain. Likely caused by overuse, strengthening with exercise your normally weak leg.   - Start taking Tylenol extra strength 1 to 2 tablets every 6-8 hours for aches or fever/chills for next few days as needed.  Do not take more than 3,000 mg in 24 hours from all medicines.   - Take prednisone taper 10 mg tablets Day 1 (Today): Take 6 pills at one time Day 2: Take 6 pills  Day 3: Take 5 pills Day 4: Take 4 pills Day 5: Take 3 pills Day 6: Take 2 pills Day 7: Take 1 pill then stop.   AFTER prednisone, May take Ibuprofen as well if tolerated 200-400mg  every 8 hours as needed. May alternate tylenol and ibuprofen in same day.  - Use heat and ice.  Apply this for 15 minutes at a time 6-8 times per day.   - Muscle rub with lidocaine, lidocaine patch, Biofreeze, or tiger balm for topical pain relief.  Avoid using this with heat and ice to avoid burns.   - Call clinic if no improvement over the next 7-10 days.  We can consider xrays of your hip, knee, and ankle.  I would also recommend physical therapy in future if no improvement.   Please schedule a follow-up appointment with Margaret Mcintosh, AGNP. Return 1-2 weeks if symptoms worsen or fail to improve.  If you have any other questions or concerns, please feel free to call the clinic or send a message through MyChart. You may also schedule an earlier appointment if necessary.  You will receive a survey after today's visit either digitally by e-mail or paper by [employer] mail. Your experiences and feedback matter to us.  Please respond so we know how we are doing as we provide care for you.   Margaret McardleLauren Chastelyn Athens, DNP, AGNP-BC Adult Gerontology Nurse Practitioner [facility], [facility]

## 2017-12-31 ENCOUNTER — Ambulatory Visit (INDEPENDENT_AMBULATORY_CARE_PROVIDER_SITE_OTHER): Payer: PPO

## 2017-12-31 DIAGNOSIS — Z23 Encounter for immunization: Secondary | ICD-10-CM | POA: Diagnosis not present

## 2018-01-02 ENCOUNTER — Encounter: Payer: Self-pay | Admitting: Nurse Practitioner

## 2018-01-17 ENCOUNTER — Telehealth: Payer: Self-pay | Admitting: Nurse Practitioner

## 2018-01-17 ENCOUNTER — Other Ambulatory Visit: Payer: Self-pay

## 2018-01-17 DIAGNOSIS — K219 Gastro-esophageal reflux disease without esophagitis: Secondary | ICD-10-CM

## 2018-01-17 MED ORDER — PANTOPRAZOLE SODIUM 40 MG PO TBEC: 40.0000 mg | DELAYED_RELEASE_TABLET | Freq: Every day | ORAL | 1 refills | Status: DC

## 2018-01-17 NOTE — Telephone Encounter (Signed)
Pt  Stopped by requesting  a refill on pantoprazole 40 mg called into [facility]

## 2018-01-29 ENCOUNTER — Other Ambulatory Visit: Payer: Self-pay | Admitting: Nurse Practitioner

## 2018-01-29 DIAGNOSIS — I10 Essential (primary) hypertension: Secondary | ICD-10-CM

## 2018-03-04 ENCOUNTER — Other Ambulatory Visit: Payer: Self-pay | Admitting: Nurse Practitioner

## 2018-03-04 DIAGNOSIS — I10 Essential (primary) hypertension: Secondary | ICD-10-CM

## 2018-04-01 ENCOUNTER — Encounter: Payer: Self-pay | Admitting: Nurse Practitioner

## 2018-04-01 ENCOUNTER — Other Ambulatory Visit: Payer: Self-pay

## 2018-04-01 ENCOUNTER — Ambulatory Visit (INDEPENDENT_AMBULATORY_CARE_PROVIDER_SITE_OTHER): Payer: PPO | Admitting: Nurse Practitioner

## 2018-04-01 VITALS — BP 165/62 | HR 63 | Temp 97.9°F | Ht 64.0 in | Wt 180.4 lb

## 2018-04-01 DIAGNOSIS — M7989 Other specified soft tissue disorders: Secondary | ICD-10-CM | POA: Diagnosis not present

## 2018-04-01 DIAGNOSIS — E119 Type 2 diabetes mellitus without complications: Secondary | ICD-10-CM | POA: Diagnosis not present

## 2018-04-01 DIAGNOSIS — E782 Mixed hyperlipidemia: Secondary | ICD-10-CM

## 2018-04-01 DIAGNOSIS — K219 Gastro-esophageal reflux disease without esophagitis: Secondary | ICD-10-CM

## 2018-04-01 DIAGNOSIS — I10 Essential (primary) hypertension: Secondary | ICD-10-CM | POA: Diagnosis not present

## 2018-04-01 LAB — POCT GLYCOSYLATED HEMOGLOBIN (HGB A1C): Hemoglobin A1C: 6.6 % — AB (ref 4.0–5.6)

## 2018-04-01 MED ORDER — PANTOPRAZOLE SODIUM 40 MG PO TBEC: 40.0000 mg | DELAYED_RELEASE_TABLET | Freq: Every day | ORAL | 1 refills | Status: DC

## 2018-04-01 MED ORDER — METFORMIN HCL 500 MG PO TABS: 500.0000 mg | ORAL_TABLET | Freq: Two times a day (BID) | ORAL | 1 refills | Status: DC

## 2018-04-01 MED ORDER — LOSARTAN POTASSIUM 50 MG PO TABS: ORAL_TABLET | ORAL | 1 refills | Status: DC

## 2018-04-01 MED ORDER — AMLODIPINE BESYLATE 10 MG PO TABS: 10.0000 mg | ORAL_TABLET | Freq: Every day | ORAL | 3 refills | Status: DC

## 2018-04-01 MED ORDER — MEDICAL COMPRESSION SOCKS MISC: 2.0000 | Freq: Every day | 0 refills | Status: AC

## 2018-04-01 MED ORDER — LABETALOL HCL 100 MG PO TABS: 100.0000 mg | ORAL_TABLET | Freq: Three times a day (TID) | ORAL | 1 refills | Status: DC

## 2018-04-01 MED ORDER — SIMVASTATIN 20 MG PO TABS: 20.0000 mg | ORAL_TABLET | Freq: Every day | ORAL | 1 refills | Status: DC

## 2018-04-01 NOTE — Progress Notes (Signed)
Subjective:    Patient ID: Margaret LawlessShelvia S Sullenger, female    DOB: 1932/07/22, 85 y.o.   MRN: [id number]  Margaret Mcintosh is a 85 y.o. female presenting on 04/01/2018 for Hypertension and Diabetes   HPI Hypertension  - She is not checking BP at home or outside of clinic.    - Current medications: labetalol 100 mg tid, amlodipine 10 mg once daily, losartan 50 mg once daily , tolerating well without side effects - She is not currently symptomatic. - Pt denies headache, lightheadedness, dizziness, changes in vision, chest tightness/pressure, palpitations, leg swelling, sudden loss of speech or loss of consciousness. - She  reports no regular exercise routine. - Her diet is moderate in salt, moderate in fat, and moderate in carbohydrates.   Diabetes Pt presents today for follow up of Type 2 diabetes mellitus. She is not checking CBG at home.  She still has a working meter and enough supplies. - Current diabetic medications include: metformin 500 mg twice daily - She is not currently symptomatic.  - She denies polydipsia, polyphagia, polyuria, headaches, diaphoresis, shakiness, chills, pain, numbness or tingling in extremities and changes in vision.   - Clinical course has been improving.  PREVENTION: Eye exam current (within one year): yes - Dr. Brooke DareKing for DM eye exam  Foot exam current (within one year): yes  Lipid/ASCVD risk reduction - on statin: yes Kidney protection - on ace or arb: yes Recent Labs    05/31/17 1502 09/28/17 0847 04/01/18 0857  HGBA1C 6.4* 6.4* 6.6*   Hyperlipidemia Patient is currently taking simvastatin 20 mg once daily.  She is tolerating well. - Pt denies changes in vision, chest tightness/pressure, palpitations, shortness of breath, leg pain while walking, leg or arm weakness, and sudden loss of speech or loss of consciousness.  Social History   Tobacco Use  . Smoking status: Never Smoker  . Smokeless tobacco: Never Used  Substance Use Topics  . Alcohol  use: No  . Drug use: No    Review of Systems Per HPI unless specifically indicated above     Objective:    BP (!) 163/58 (BP Location: Left Arm, Patient Position: Sitting, Cuff Size: Normal)   Pulse 63   Temp 97.9 F (36.6 C) (Oral)   Ht 5\' 4"  (1.626 m)   Wt 180 lb 6.4 oz (81.8 kg)   BMI 30.97 kg/m   Wt Readings from Last 3 Encounters:  04/01/18 180 lb 6.4 oz (81.8 kg)  10/25/17 175 lb 12.8 oz (79.7 kg)  09/28/17 177 lb (80.3 kg)    Physical Exam Vitals signs reviewed.  Constitutional:      General: She is awake. She is not in acute distress.    Appearance: She is well-developed.  HENT:     Head: Normocephalic and atraumatic.  Neck:     Musculoskeletal: Normal range of motion and neck supple.     Vascular: No carotid bruit.  Cardiovascular:     Rate and Rhythm: Normal rate and regular rhythm.     Pulses:          Radial pulses are 2+ on the right side and 2+ on the left side.       Posterior tibial pulses are 1+ on the right side and 1+ on the left side.     Heart sounds: Normal heart sounds, S1 normal and S2 normal.  Pulmonary:     Effort: Pulmonary effort is normal. No respiratory distress.     Breath sounds:  Normal breath sounds and air entry.  Skin:    General: Skin is warm and dry.  Neurological:     Mental Status: She is alert and oriented to person, place, and time.  Psychiatric:        Attention and Perception: Attention normal.        Mood and Affect: Mood and affect normal.        Behavior: Behavior normal. Behavior is cooperative.    Results for orders placed or performed in visit on 09/28/17  POCT HgB A1C  Result Value Ref Range   Hemoglobin A1C 6.4 (A) 4.0 - 5.6 %   HbA1c POC (<> result, manual entry)  4.0 - 5.6 %   HbA1c, POC (prediabetic range)  5.7 - 6.4 %   HbA1c, POC (controlled diabetic range)  0.0 - 7.0 %      Assessment & Plan:   Problem List Items Addressed This Visit      Cardiovascular and Mediastinum    Hypertension Uncontrolled hypertension.  BP goal < 140/80 due to medical frailty.  Pt is not currently working on lifestyle modifications.  Taking medications tolerating well without side effects. Complications: more dietary indiscretions.  Plan: 1. Continue taking amlodipine, labetalol without change - INCREASE losartan by 50 mg - take 100 mg in am and 50 mg in evening.   2. Obtain labs today  3. Encouraged heart healthy diet and increasing exercise to 30 minutes most days of the week. 4. Check BP 1-2 x per week at home, keep log, and bring to clinic at next appointment. 5. Follow up 3 months.     Relevant Medications   Elastic Bandages & Supports (MEDICAL COMPRESSION SOCKS) MISC   losartan (COZAAR) 50 MG tablet   amLODipine (NORVASC) 10 MG tablet   labetalol (NORMODYNE) 100 MG tablet   simvastatin (ZOCOR) 20 MG tablet   Other Relevant Orders   COMPLETE METABOLIC PANEL WITH GFR     Endocrine   Diabetes (HCC) - Primary ControlledDM with A1c 6.6% stable from 3 months ago and goal A1c < 7.0%. - Complications - hyperlipidemia and cataracts.  Plan:  1. Continue current therapy: metformin 500 mg bid 2. Encourage improved lifestyle: - low carb/low glycemic diet reinforced prior education - Increase physical activity to 30 minutes most days of the week.  Explained that increased physical activity increases body's use of sugar for energy. 3. Check fasting am CBG and bring log to next visit for review 4. Continue ASA, ARB and Statin 5. Advised to schedule DM ophtho exam, send record.  Labs today. 6. Follow-up 3 months.    Relevant Medications   Elastic Bandages & Supports (MEDICAL COMPRESSION SOCKS) MISC   losartan (COZAAR) 50 MG tablet   metFORMIN (GLUCOPHAGE) 500 MG tablet   simvastatin (ZOCOR) 20 MG tablet   Other Relevant Orders   POCT glycosylated hemoglobin (Hb A1C) (Completed)   COMPLETE METABOLIC PANEL WITH GFR   Lipid panel     Other   Hyperlipemia Unknown status of  hyperlipidemia without recent labs.  Currently taking simvastatin 10 mg once daily. No new ASCVD events since last visit.   - Prior personal history of prior ASCVD events.  Plan: 1. Continue taking simvastatin 10 mg once daily.  May increase dose dependent upon labs today 2. Discussed low glycemic diet. - handout provided 3. Discussed increasing exercise to 30 minutes most days of the week. 4. Labs today. 5. Follow-up in about 3 months.    Relevant Medications  losartan (COZAAR) 50 MG tablet   amLODipine (NORVASC) 10 MG tablet   labetalol (NORMODYNE) 100 MG tablet   simvastatin (ZOCOR) 20 MG tablet   Other Relevant Orders   Lipid panel    Other Visit Diagnoses    Leg swelling     Patient with venous insufficiency, leg swelling with more dependent time.   - Start wearing compression socks 15-22 mmhg preferred. - Follow-up prn.   Relevant Medications   Elastic Bandages & Supports (MEDICAL COMPRESSION SOCKS) MISC   Gastroesophageal reflux disease, esophagitis presence not specified     Stable today on exam.  No signs and symptoms of bleeding.  Medications tolerated without side effects.  Continue at current doses.  Refills provided.  Check labs today. Followup 3 months.      Relevant Medications   pantoprazole (PROTONIX) 40 MG tablet   Other Relevant Orders   CBC with Differential/Platelet      Meds ordered this encounter  Medications  . labetalol (NORMODYNE) 100 MG tablet    Sig: Take 1 tablet (100 mg total) by mouth 3 (three) times daily.    Dispense:  90 tablet    Refill:  1    Please consider 90 day supplies to promote better adherence  . Elastic Bandages & Supports (MEDICAL COMPRESSION SOCKS) MISC    Sig: 2 Devices by Does not apply route daily.    Dispense:  2 each    Refill:  0    Order Specific Question:   Supervising Provider    Answer:   Smitty CordsKARAMALEGOS, ALEXANDER J [[id number]]  . losartan (COZAAR) 50 MG tablet    Sig: Take 2 tablets (100 mg total) by mouth daily AND 1  tablet (50 mg total) daily.    Dispense:  270 tablet    Refill:  1    Order Specific Question:   Supervising Provider    Answer:   Smitty CordsKARAMALEGOS, ALEXANDER J [[id number]]    Follow up plan: Return in about 3 months (around 07/01/2018) for hypertension, diabetes.  Wilhelmina McardleLauren Jennefer Kopp, DNP, AGPCNP-BC Adult Gerontology Primary Care Nurse Practitioner [facility] 04/01/2018, 8:36 AM

## 2018-04-01 NOTE — Patient Instructions (Addendum)
Margaret Mcintosh,   Thank you for coming in to clinic today.  1. Start taking a calcium tablet or chew at lunch time.  - Take 500-600 mg calcium once daily.  One brand is [place], but you can use any brand you prefer.  2. INCREASE losartan to 100 mg in morning and 50 mg in evening.  3. Continue all other medications without change.  Please schedule a follow-up appointment with Wilhelmina Mcardle, AGNP. Return in about 3 months (around 07/01/2018) for hypertension, diabetes.  If you have any other questions or concerns, please feel free to call the clinic or send a message through MyChart. You may also schedule an earlier appointment if necessary.  You will receive a survey after today's visit either digitally by e-mail or paper by [employer]. Your experiences and feedback matter to [place].  Please respond so we know how we are doing as we provide care for you.  Wilhelmina Mcardle, DNP, AGNP-BC Adult Gerontology Nurse Practitioner [facility], [facility]

## 2018-04-02 ENCOUNTER — Other Ambulatory Visit: Payer: Self-pay | Admitting: Nurse Practitioner

## 2018-04-02 DIAGNOSIS — I10 Essential (primary) hypertension: Secondary | ICD-10-CM

## 2018-04-02 LAB — COMPLETE METABOLIC PANEL WITH GFR
AG Ratio: 1.7 (calc) (ref 1.0–2.5)
ALT: 13 U/L (ref 6–29)
AST: 15 U/L (ref 10–35)
Albumin: 4.3 g/dL (ref 3.6–5.1)
Alkaline phosphatase (APISO): 55 U/L (ref 33–130)
BUN: 14 mg/dL (ref 7–25)
CO2: 29 mmol/L (ref 20–32)
Calcium: 9.7 mg/dL (ref 8.6–10.4)
Chloride: 104 mmol/L (ref 98–110)
Creat: 0.78 mg/dL (ref 0.60–0.88)
GFR, Est African American: 81 mL/min/{1.73_m2} (ref >=60)
GFR, Est Non African American: 70 mL/min/{1.73_m2} (ref >=60)
Globulin: 2.5 g/dL (calc) (ref 1.9–3.7)
Glucose, Bld: 136 mg/dL — ABNORMAL HIGH (ref 65–99)
Potassium: 4.1 mmol/L (ref 3.5–5.3)
Sodium: 140 mmol/L (ref 135–146)
Total Bilirubin: 0.6 mg/dL (ref 0.2–1.2)
Total Protein: 6.8 g/dL (ref 6.1–8.1)

## 2018-04-02 LAB — CBC WITH DIFFERENTIAL/PLATELET
Absolute Monocytes: 495 cells/uL (ref 200–950)
Basophils Absolute: 30 cells/uL (ref 0–200)
Basophils Relative: 0.9 %
Eosinophils Absolute: 99 cells/uL (ref 15–500)
Eosinophils Relative: 3 %
HCT: 35.3 % (ref 35.0–45.0)
Hemoglobin: 11.3 g/dL — ABNORMAL LOW (ref 11.7–15.5)
Lymphs Abs: 723 cells/uL — ABNORMAL LOW (ref 850–3900)
MCH: 27.9 pg (ref 27.0–33.0)
MCHC: 32 g/dL (ref 32.0–36.0)
MCV: 87.2 fL (ref 80.0–100.0)
MPV: 12.5 fL (ref 7.5–12.5)
Monocytes Relative: 15 %
Neutro Abs: 1954 cells/uL (ref 1500–7800)
Neutrophils Relative %: 59.2 %
Platelets: 179 10*3/uL (ref 140–400)
RBC: 4.05 10*6/uL (ref 3.80–5.10)
RDW: 12.2 % (ref 11.0–15.0)
Total Lymphocyte: 21.9 %
WBC: 3.3 10*3/uL — ABNORMAL LOW (ref 3.8–10.8)

## 2018-04-02 LAB — LIPID PANEL
Cholesterol: 145 mg/dL (ref <200)
HDL: 60 mg/dL (ref >50)
LDL Cholesterol (Calc): 72 mg/dL (calc)
Non-HDL Cholesterol (Calc): 85 mg/dL (calc) (ref <130)
Total CHOL/HDL Ratio: 2.4 (calc) (ref <5.0)
Triglycerides: 58 mg/dL (ref <150)

## 2018-04-02 MED ORDER — LOSARTAN POTASSIUM 50 MG PO TABS: ORAL_TABLET | ORAL | 1 refills | Status: DC

## 2018-07-08 ENCOUNTER — Other Ambulatory Visit: Payer: Self-pay

## 2018-07-08 ENCOUNTER — Encounter: Payer: Self-pay | Admitting: Nurse Practitioner

## 2018-07-08 ENCOUNTER — Ambulatory Visit (INDEPENDENT_AMBULATORY_CARE_PROVIDER_SITE_OTHER): Payer: PPO | Admitting: Nurse Practitioner

## 2018-07-08 DIAGNOSIS — E1165 Type 2 diabetes mellitus with hyperglycemia: Secondary | ICD-10-CM

## 2018-07-08 DIAGNOSIS — I10 Essential (primary) hypertension: Secondary | ICD-10-CM

## 2018-07-08 DIAGNOSIS — R29898 Other symptoms and signs involving the musculoskeletal system: Secondary | ICD-10-CM | POA: Diagnosis not present

## 2018-07-08 DIAGNOSIS — K219 Gastro-esophageal reflux disease without esophagitis: Secondary | ICD-10-CM

## 2018-07-08 DIAGNOSIS — E782 Mixed hyperlipidemia: Secondary | ICD-10-CM | POA: Diagnosis not present

## 2018-07-08 DIAGNOSIS — I6930 Unspecified sequelae of cerebral infarction: Secondary | ICD-10-CM | POA: Diagnosis not present

## 2018-07-08 DIAGNOSIS — E119 Type 2 diabetes mellitus without complications: Secondary | ICD-10-CM

## 2018-07-08 MED ORDER — METFORMIN HCL 500 MG PO TABS: 500.0000 mg | ORAL_TABLET | Freq: Two times a day (BID) | ORAL | 1 refills | Status: DC

## 2018-07-08 MED ORDER — LABETALOL HCL 100 MG PO TABS: 100.0000 mg | ORAL_TABLET | Freq: Three times a day (TID) | ORAL | 1 refills | Status: DC

## 2018-07-08 MED ORDER — AMLODIPINE BESYLATE 10 MG PO TABS: 10.0000 mg | ORAL_TABLET | Freq: Every day | ORAL | 3 refills | Status: DC

## 2018-07-08 MED ORDER — LOSARTAN POTASSIUM 50 MG PO TABS: ORAL_TABLET | ORAL | 1 refills | Status: DC

## 2018-07-08 MED ORDER — PANTOPRAZOLE SODIUM 40 MG PO TBEC: 40.0000 mg | DELAYED_RELEASE_TABLET | Freq: Every day | ORAL | 1 refills | Status: DC

## 2018-07-08 MED ORDER — SIMVASTATIN 20 MG PO TABS: 20.0000 mg | ORAL_TABLET | Freq: Every day | ORAL | 1 refills | Status: DC

## 2018-07-08 MED ORDER — FREESTYLE LITE TEST VI STRP: 1.0000 | ORAL_STRIP | Freq: Every day | 4 refills | Status: DC

## 2018-07-08 NOTE — Progress Notes (Signed)
Telemedicine Encounter: Disclosed to patient at start of encounter that we will provide appropriate telemedicine services.  Patient consents to be treated via phone prior to discussion. - Patient is at her home and is accessed via telephone. - Services are provided by Wilhelmina Mcardle from [facility].  Subjective:    Patient ID: Margaret Mcintosh, female    DOB: [birth year], 85 y.o.   MRN: [id number]  Margaret Mcintosh is a 85 y.o. female presenting on 07/08/2018 for Diabetes (blood sugar a little elevated yesterday 136 mcg fasting blood sugar) and Hypertension (133.74 w/ 63 pulse)  HPI Diabetes Pt presents today for follow up of Type 2 diabetes mellitus.  She is not checking CBG at home. Yesterday was 136 and a "little high" had a baked potato the night prior. - Current diabetic medications include: metformin 500 mg twice daily - She is not currently symptomatic.  - She denies polydipsia, polyphagia, polyuria, headaches, diaphoresis, shakiness, chills, pain, numbness or tingling in extremities and changes in vision.   - Clinical course has been improving. - She reports no regular exercise routine. - Her diet is low in salt, moderate in fat, and moderate in carbohydrates. - Weight trend: stable  PREVENTION: Eye exam current (within one year): no  Foot exam current (within one year): yes  Lipid/ASCVD risk reduction - on statin: yes Kidney protection - on ace or arb: yes Recent Labs    09/28/17 0847 04/01/18 0857  HGBA1C 6.4* 6.6*   Hypertension - She is checking BP at home or outside of clinic.  Readings 130-135/80s usually; Lowest: 115/80; Highest: 162 in January - Current medications: amlodipine 10 mg once daily, labetalol tid, losartan 100 mg in am and 50 mg in pm, tolerating well without side effects.  - She is not currently symptomatic. - Pt denies headache, lightheadedness, dizziness, changes in vision, chest tightness/pressure, palpitations, leg swelling,  sudden loss of speech or loss of consciousness.   Weakness Patient continues to have leg weakness occasionally when cooking. Is not exercising regularly.  Stable without change since stroke.   GERD - Patient is taking pantoprazole 40 mg once daily with good control of heartburn symptoms.  Rarely has breakthrough heartburn - She is not currently symptomatic.  - She reports no n/v, coffee ground emesis, dark/black/tarry stool, BRBPR, or other GI bleeding.    Hyperlipidemia History of CVA Patient is taking her simvastatin 20 mg once daily at bedtime and tolerates well without side effects. - Pt denies changes in vision, chest tightness/pressure, palpitations, shortness of breath, leg pain while walking, leg or arm weakness, and sudden loss of speech or loss of consciousness.   Social History   Tobacco Use  . Smoking status: Never Smoker  . Smokeless tobacco: Never Used  Substance Use Topics  . Alcohol use: No  . Drug use: No    Review of Systems Per HPI unless specifically indicated above     Objective:    There were no vitals taken for this visit.  Wt Readings from Last 3 Encounters:  04/01/18 180 lb 6.4 oz (81.8 kg)  10/25/17 175 lb 12.8 oz (79.7 kg)  09/28/17 177 lb (80.3 kg)    Physical Exam Patient remotely monitored.  Verbal communication appropriate.  Cognition normal.   Results for orders placed or performed in visit on 04/01/18  COMPLETE METABOLIC PANEL WITH GFR  Result Value Ref Range   Glucose, Bld 136 (H) 65 - 99 mg/dL   BUN 14 7 -  25 mg/dL   Creat 1.610.78 0.960.60 - 0.450.88 mg/dL   GFR, Est Non African American 70 > OR = 60 mL/min/1.6673m2   GFR, Est African American 81 > OR = 60 mL/min/1.2373m2   BUN/Creatinine Ratio NOT APPLICABLE 6 - 22 (calc)   Sodium 140 135 - 146 mmol/L   Potassium 4.1 3.5 - 5.3 mmol/L   Chloride 104 98 - 110 mmol/L   CO2 29 20 - 32 mmol/L   Calcium 9.7 8.6 - 10.4 mg/dL   Total Protein 6.8 6.1 - 8.1 g/dL   Albumin 4.3 3.6 - 5.1 g/dL    Globulin 2.5 1.9 - 3.7 g/dL (calc)   AG Ratio 1.7 1.0 - 2.5 (calc)   Total Bilirubin 0.6 0.2 - 1.2 mg/dL   Alkaline phosphatase (APISO) 55 33 - 130 U/L   AST 15 10 - 35 U/L   ALT 13 6 - 29 U/L  Lipid panel  Result Value Ref Range   Cholesterol 145 <200 mg/dL   HDL 60 >40>50 mg/dL   Triglycerides 58 <981<150 mg/dL   LDL Cholesterol (Calc) 72 mg/dL (calc)   Total CHOL/HDL Ratio 2.4 <5.0 (calc)   Non-HDL Cholesterol (Calc) 85 <191<130 mg/dL (calc)  CBC with Differential/Platelet  Result Value Ref Range   WBC 3.3 (L) 3.8 - 10.8 Thousand/uL   RBC 4.05 3.80 - 5.10 Million/uL   Hemoglobin 11.3 (L) 11.7 - 15.5 g/dL   HCT 47.835.3 29.535.0 - 62.145.0 %   MCV 87.2 80.0 - 100.0 fL   MCH 27.9 27.0 - 33.0 pg   MCHC 32.0 32.0 - 36.0 g/dL   RDW 30.812.2 65.711.0 - 84.615.0 %   Platelets 179 140 - 400 Thousand/uL   MPV 12.5 7.5 - 12.5 fL   Neutro Abs 1,954 1,500 - 7,800 cells/uL   Lymphs Abs 723 (L) 850 - 3,900 cells/uL   Absolute Monocytes 495 200 - 950 cells/uL   Eosinophils Absolute 99 15 - 500 cells/uL   Basophils Absolute 30 0 - 200 cells/uL   Neutrophils Relative % 59.2 %   Total Lymphocyte 21.9 %   Monocytes Relative 15.0 %   Eosinophils Relative 3.0 %   Basophils Relative 0.9 %  POCT glycosylated hemoglobin (Hb A1C)  Result Value Ref Range   Hemoglobin A1C 6.6 (A) 4.0 - 5.6 %   HbA1c POC (<> result, manual entry)     HbA1c, POC (prediabetic range)     HbA1c, POC (controlled diabetic range)        Assessment & Plan:   Problem List Items Addressed This Visit      Cardiovascular and Mediastinum   Accelerated hypertension Stable today on patient's HPI and telephone evaluation. Patient has had mild dizziness, but is only positional.  May need to focus more on staying hydrated to avoid these.  Medications tolerated without side effects.  Continue at current doses.  Refills provided.  Check labs in 3 mos at next visit.  Last labs 3 months ago were WNL. Followup 3 months.    Relevant Medications   amLODipine  (NORVASC) 10 MG tablet   labetalol (NORMODYNE) 100 MG tablet   losartan (COZAAR) 50 MG tablet   simvastatin (ZOCOR) 20 MG tablet     Digestive   GERD (gastroesophageal reflux disease) Stable today on patient's HPI and telephone evaluation.  Medications tolerated without side effects. Patient has not had any bleeding or breakthrough heartburn. Continue at current doses.  Refills provided.  Check labs in 3 mos at next visit.  Last  labs 3 months ago were WNL. Followup 3 months.    Relevant Medications   pantoprazole (PROTONIX) 40 MG tablet     Endocrine   Diabetes (HCC) - Primary Stable today on patient's HPI and telephone evaluation.  Medications tolerated without side effects.  Continue at current doses.  Refills provided. Encouraged patient to resume checking CBG every morning.  Also, patient will need to get eye exam in next 3-6 months. Check labs in 3 months at next visit.  Last labs 3 months ago were WNL. Followup 3 months.    Relevant Medications   FREESTYLE LITE test strip   losartan (COZAAR) 50 MG tablet   metFORMIN (GLUCOPHAGE) 500 MG tablet   simvastatin (ZOCOR) 20 MG tablet     Nervous and Auditory   Left leg weakness (Chronic) History of stroke with current residual effects  Hyperlipemia  Stable since stroke.  Encouraged patient to continue staying active to encourage strength.  Continue balance exercises.  Manage cholesterol to prevent future strokes.  No ASCVD events since last evaluation. - Continue simvastatin without changes - Continue regular activity - Continue heart healthy diet. - Follow-up 3 months with labs.      Relevant Medications   amLODipine (NORVASC) 10 MG tablet   labetalol (NORMODYNE) 100 MG tablet   losartan (COZAAR) 50 MG tablet   simvastatin (ZOCOR) 20 MG tablet    Other Visit Diagnoses    Essential hypertension     See Accelerated hypertension above.   Relevant Medications   amLODipine (NORVASC) 10 MG tablet   labetalol (NORMODYNE) 100 MG  tablet   losartan (COZAAR) 50 MG tablet   simvastatin (ZOCOR) 20 MG tablet      Meds ordered this encounter  Medications  . FREESTYLE LITE test strip    Sig: 1 each by Other route daily.    Dispense:  90 each    Refill:  4    Order Specific Question:   Supervising Provider    Answer:   Smitty Cords [[id number]]  . amLODipine (NORVASC) 10 MG tablet    Sig: Take 1 tablet (10 mg total) by mouth daily.    Dispense:  90 tablet    Refill:  3    Order Specific Question:   Supervising Provider    Answer:   Smitty Cords [[id number]]  . labetalol (NORMODYNE) 100 MG tablet    Sig: Take 1 tablet (100 mg total) by mouth 3 (three) times daily.    Dispense:  270 tablet    Refill:  1    Please provide 90-day supply.    Order Specific Question:   Supervising Provider    Answer:   Smitty Cords [[id number]]  . losartan (COZAAR) 50 MG tablet    Sig: Take 2 tablets (100 mg total) by mouth every morning AND 1 tablet (50 mg total) every evening.    Dispense:  270 tablet    Refill:  1    Order Specific Question:   Supervising Provider    Answer:   Smitty Cords [[id number]]  . metFORMIN (GLUCOPHAGE) 500 MG tablet    Sig: Take 1 tablet (500 mg total) by mouth 2 (two) times daily with a meal.    Dispense:  180 tablet    Refill:  1    Order Specific Question:   Supervising Provider    Answer:   Smitty Cords [[id number]]  . pantoprazole (PROTONIX) 40 MG tablet    Sig: Take 1 tablet (40 mg  total) by mouth daily at 6 (six) AM.    Dispense:  90 tablet    Refill:  1    Order Specific Question:   Supervising Provider    Answer:   Smitty Cords [[id number]]  . simvastatin (ZOCOR) 20 MG tablet    Sig: Take 1 tablet (20 mg total) by mouth at bedtime.    Dispense:  90 tablet    Refill:  1    Order Specific Question:   Supervising Provider    Answer:   Smitty Cords [[id number]]   - Time spent in direct consultation with patient via telemedicine about above  concerns: 14 minutes  Follow up plan: Return in about 3 months (around 10/07/2018) for hypertension, diabetes, cholesterol.  Wilhelmina Mcardle, DNP, AGPCNP-BC Adult Gerontology Primary Care Nurse Practitioner [facility] 07/08/2018, 8:21 AM

## 2018-10-14 ENCOUNTER — Other Ambulatory Visit: Payer: Self-pay

## 2018-10-14 ENCOUNTER — Encounter: Payer: Self-pay | Admitting: Nurse Practitioner

## 2018-10-14 ENCOUNTER — Ambulatory Visit (INDEPENDENT_AMBULATORY_CARE_PROVIDER_SITE_OTHER): Payer: PPO | Admitting: Nurse Practitioner

## 2018-10-14 VITALS — BP 137/61 | HR 60 | Ht 64.0 in | Wt 176.8 lb

## 2018-10-14 DIAGNOSIS — I10 Essential (primary) hypertension: Secondary | ICD-10-CM

## 2018-10-14 DIAGNOSIS — I6930 Unspecified sequelae of cerebral infarction: Secondary | ICD-10-CM

## 2018-10-14 DIAGNOSIS — K219 Gastro-esophageal reflux disease without esophagitis: Secondary | ICD-10-CM | POA: Diagnosis not present

## 2018-10-14 DIAGNOSIS — E119 Type 2 diabetes mellitus without complications: Secondary | ICD-10-CM

## 2018-10-14 DIAGNOSIS — E1165 Type 2 diabetes mellitus with hyperglycemia: Secondary | ICD-10-CM | POA: Diagnosis not present

## 2018-10-14 DIAGNOSIS — E782 Mixed hyperlipidemia: Secondary | ICD-10-CM

## 2018-10-14 LAB — POCT GLYCOSYLATED HEMOGLOBIN (HGB A1C): Hemoglobin A1C: 6.4 % — AB (ref 4.0–5.6)

## 2018-10-14 MED ORDER — LABETALOL HCL 100 MG PO TABS: 100.0000 mg | ORAL_TABLET | Freq: Three times a day (TID) | ORAL | 1 refills | Status: DC

## 2018-10-14 MED ORDER — LOSARTAN POTASSIUM 50 MG PO TABS: ORAL_TABLET | ORAL | 1 refills | Status: DC

## 2018-10-14 MED ORDER — PANTOPRAZOLE SODIUM 40 MG PO TBEC: 40.0000 mg | DELAYED_RELEASE_TABLET | Freq: Every day | ORAL | 1 refills | Status: DC

## 2018-10-14 MED ORDER — SIMVASTATIN 20 MG PO TABS: 20.0000 mg | ORAL_TABLET | Freq: Every day | ORAL | 1 refills | Status: DC

## 2018-10-14 MED ORDER — METFORMIN HCL 500 MG PO TABS: 500.0000 mg | ORAL_TABLET | Freq: Two times a day (BID) | ORAL | 1 refills | Status: DC

## 2018-10-14 NOTE — Progress Notes (Signed)
Subjective:    Patient ID: Margaret LawlessShelvia S Buntyn, female    DOB: Jan 05, 1933, 85 y.o.   MRN: [id number]  Margaret Mcintosh is a 85 y.o. female presenting on 10/14/2018 for Diabetes and Hypertension  HPI Diabetes Pt presents today for follow up of Type 2 diabetes mellitus. She is checking CBG at home with a range of 140-150 when she doesn't feel good.  Matters what she eats. - Current diabetic medications include: metformin - She is not currently symptomatic.  - She denies polydipsia, polyphagia, polyuria, headaches, diaphoresis, shakiness, chills, pain, numbness or tingling in extremities and changes in vision.   - Clinical course has been stable. - She  limited by back pain, but is as active as possible.  Also limited currently for heat outdoors.. - Her diet is moderate in salt, moderate in fat, and moderate in carbohydrates. - Weight trend: stable  PREVENTION: Eye exam current (within one year): no Foot exam current (within one year): no  Lipid/ASCVD risk reduction - on statin: yes Kidney protection - on ace or arb: yes Recent Labs    04/01/18 0857 10/14/18 0854  HGBA1C 6.6* 6.4*   Hypertension - She is checking BP at home or outside of clinic.  Readings 140 SBP when she doesn't feel well.  SBP normally in 120-130s - Current medications: labetalol, losartan, amlodipine, tolerating well without side effects - She is not currently symptomatic. - Pt denies headache, lightheadedness, dizziness, changes in vision, chest tightness/pressure, palpitations, leg swelling, sudden loss of speech or loss of consciousness.  Notes occasionally doesn't feel well and has higher BP when she checks her BP readings.  GERD Patient continues taking pantoprazole without side effects.  No signs and symptoms of bleeding are noted.  She also has good control of GERD with appropriate diet modifications.  No breakthrough symptoms unless she has dietary indiscretions.  Social History   Tobacco Use  .  Smoking status: Never Smoker  . Smokeless tobacco: Never Used  Substance Use Topics  . Alcohol use: No  . Drug use: No   Review of Systems Per HPI unless specifically indicated above     Objective:    BP 137/61 (BP Location: Right Arm, Patient Position: Sitting, Cuff Size: Normal)   Pulse 60   Ht 5\' 4"  (1.626 m)   Wt 176 lb 12.8 oz (80.2 kg)   BMI 30.35 kg/m   Wt Readings from Last 3 Encounters:  10/14/18 176 lb 12.8 oz (80.2 kg)  04/01/18 180 lb 6.4 oz (81.8 kg)  10/25/17 175 lb 12.8 oz (79.7 kg)    Physical Exam Vitals signs reviewed.  Constitutional:      General: She is awake. She is not in acute distress.    Appearance: She is well-developed.  HENT:     Head: Normocephalic and atraumatic.     Nose: Nose normal.     Mouth/Throat:     Mouth: Mucous membranes are moist.     Pharynx: Oropharynx is clear.  Eyes:     Pupils: Pupils are equal, round, and reactive to light.  Neck:     Musculoskeletal: Normal range of motion and neck supple.     Vascular: No carotid bruit.  Cardiovascular:     Rate and Rhythm: Normal rate and regular rhythm.     Pulses:          Radial pulses are 2+ on the right side and 2+ on the left side.       Posterior tibial pulses  are 1+ on the right side and 1+ on the left side.     Heart sounds: Normal heart sounds, S1 normal and S2 normal.  Pulmonary:     Effort: Pulmonary effort is normal. No respiratory distress.     Breath sounds: Normal breath sounds and air entry.  Abdominal:     General: Abdomen is flat. Bowel sounds are normal.     Palpations: Abdomen is soft. There is no hepatomegaly or splenomegaly.     Tenderness: There is no abdominal tenderness.  Skin:    General: Skin is warm and dry.     Capillary Refill: Capillary refill takes less than 2 seconds.  Neurological:     General: No focal deficit present.     Mental Status: She is alert and oriented to person, place, and time. Mental status is at baseline.  Psychiatric:         Attention and Perception: Attention normal.        Mood and Affect: Mood and affect normal.        Behavior: Behavior normal. Behavior is cooperative.        Thought Content: Thought content normal.        Judgment: Judgment normal.    Results for orders placed or performed in visit on 04/01/18  COMPLETE METABOLIC PANEL WITH GFR  Result Value Ref Range   Glucose, Bld 136 (H) 65 - 99 mg/dL   BUN 14 7 - 25 mg/dL   Creat 1.610.78 0.960.60 - 0.450.88 mg/dL   GFR, Est Non African American 70 > OR = 60 mL/min/1.1173m2   GFR, Est African American 81 > OR = 60 mL/min/1.173m2   BUN/Creatinine Ratio NOT APPLICABLE 6 - 22 (calc)   Sodium 140 135 - 146 mmol/L   Potassium 4.1 3.5 - 5.3 mmol/L   Chloride 104 98 - 110 mmol/L   CO2 29 20 - 32 mmol/L   Calcium 9.7 8.6 - 10.4 mg/dL   Total Protein 6.8 6.1 - 8.1 g/dL   Albumin 4.3 3.6 - 5.1 g/dL   Globulin 2.5 1.9 - 3.7 g/dL (calc)   AG Ratio 1.7 1.0 - 2.5 (calc)   Total Bilirubin 0.6 0.2 - 1.2 mg/dL   Alkaline phosphatase (APISO) 55 33 - 130 U/L   AST 15 10 - 35 U/L   ALT 13 6 - 29 U/L  Lipid panel  Result Value Ref Range   Cholesterol 145 <200 mg/dL   HDL 60 >40>50 mg/dL   Triglycerides 58 <981<150 mg/dL   LDL Cholesterol (Calc) 72 mg/dL (calc)   Total CHOL/HDL Ratio 2.4 <5.0 (calc)   Non-HDL Cholesterol (Calc) 85 <191<130 mg/dL (calc)  CBC with Differential/Platelet  Result Value Ref Range   WBC 3.3 (L) 3.8 - 10.8 Thousand/uL   RBC 4.05 3.80 - 5.10 Million/uL   Hemoglobin 11.3 (L) 11.7 - 15.5 g/dL   HCT 47.835.3 29.535.0 - 62.145.0 %   MCV 87.2 80.0 - 100.0 fL   MCH 27.9 27.0 - 33.0 pg   MCHC 32.0 32.0 - 36.0 g/dL   RDW 30.812.2 65.711.0 - 84.615.0 %   Platelets 179 140 - 400 Thousand/uL   MPV 12.5 7.5 - 12.5 fL   Neutro Abs 1,954 1,500 - 7,800 cells/uL   Lymphs Abs 723 (L) 850 - 3,900 cells/uL   Absolute Monocytes 495 200 - 950 cells/uL   Eosinophils Absolute 99 15 - 500 cells/uL   Basophils Absolute 30 0 - 200 cells/uL   Neutrophils Relative % 59.2 %  Total Lymphocyte 21.9  %   Monocytes Relative 15.0 %   Eosinophils Relative 3.0 %   Basophils Relative 0.9 %  POCT glycosylated hemoglobin (Hb A1C)  Result Value Ref Range   Hemoglobin A1C 6.6 (A) 4.0 - 5.6 %   HbA1c POC (<> result, manual entry)     HbA1c, POC (prediabetic range)     HbA1c, POC (controlled diabetic range)        Assessment & Plan:   Problem List Items Addressed This Visit      Cardiovascular and Mediastinum   Essential hypertension Controlled hypertension.  BP goal < 140/80 due to medical frailty/age.  Pt is continuing regular lifestyle modifications.  Taking medications tolerating well without side effects. No current complications.  Plan: 1. Continue taking labetalol, losartan, and simvastatin without changes.  Stable doses. 2. Obtain labs today for renal assessment, potassium  3. Encouraged heart healthy diet and increasing exercise to 30 minutes most days of the week. 4. Check BP 1-2 x per week at home, keep log, and bring to clinic at next appointment. 5. Follow up 6 months.     Relevant Medications   labetalol (NORMODYNE) 100 MG tablet   losartan (COZAAR) 50 MG tablet   simvastatin (ZOCOR) 20 MG tablet   Other Relevant Orders   COMPLETE METABOLIC PANEL WITH GFR     Digestive   GERD (gastroesophageal reflux disease) Stable today on exam.  Medications tolerated without side effects.  Continue at current doses.  Refills provided.   . Followup 6 months.   Relevant Medications   pantoprazole (PROTONIX) 40 MG tablet   Other Relevant Orders   COMPLETE METABOLIC PANEL WITH GFR     Endocrine   Diabetes ([place]) - Primary ControlledDM with A1c 6.4% stable from 6.6% and goal A1c < 7.0%. - Complications - hyperglycemia at times with CBG 150s.  Plan:  1. Continue current therapy: metformin only since A1c WNL 2. Encourage improved lifestyle: - low carb/low glycemic diet reinforced prior education - Increase physical activity to 30 minutes most days of the week.  Explained that  increased physical activity increases body's use of sugar for energy. 3. Check fasting am CBG and bring log to next visit for review 4. Continue ASA, ARB and Statin 5. Advised to schedule DM ophtho exam, send record.  6. Follow-up 6 months   Relevant Medications   losartan (COZAAR) 50 MG tablet   metFORMIN (GLUCOPHAGE) 500 MG tablet   simvastatin (ZOCOR) 20 MG tablet   Other Relevant Orders   POCT glycosylated hemoglobin (Hb A1C) (Completed)   COMPLETE METABOLIC PANEL WITH GFR   Lipid panel     Other   History of stroke with current residual effects Stable without new signs and symptoms of CVA since last visit.  Continue control of hyperlipidemia, DM and HYPERTENSION as above.   Relevant Orders   COMPLETE METABOLIC PANEL WITH GFR   Lipid panel   Hyperlipemia Stable on last labs.  Due for recheck today.   Meds stable without side effects.  Continue at current doses.  Follow-up after labs with changes if needed and in 6 months.   Relevant Medications   labetalol (NORMODYNE) 100 MG tablet   losartan (COZAAR) 50 MG tablet   simvastatin (ZOCOR) 20 MG tablet   Other Relevant Orders   Lipid panel      Meds ordered this encounter  Medications  . labetalol (NORMODYNE) 100 MG tablet    Sig: Take 1 tablet (100 mg total) by  mouth 3 (three) times daily.    Dispense:  270 tablet    Refill:  1    Please provide 90-day supply.    Order Specific Question:   Supervising Provider    Answer:   Smitty CordsKARAMALEGOS, ALEXANDER J [[id number]]  . losartan (COZAAR) 50 MG tablet    Sig: Take 2 tablets (100 mg total) by mouth every morning AND 1 tablet (50 mg total) every evening.    Dispense:  270 tablet    Refill:  1    Order Specific Question:   Supervising Provider    Answer:   Smitty CordsKARAMALEGOS, ALEXANDER J [[id number]]  . metFORMIN (GLUCOPHAGE) 500 MG tablet    Sig: Take 1 tablet (500 mg total) by mouth 2 (two) times daily with a meal.    Dispense:  180 tablet    Refill:  1    Order Specific Question:   Supervising  Provider    Answer:   Smitty CordsKARAMALEGOS, ALEXANDER J [[id number]]  . simvastatin (ZOCOR) 20 MG tablet    Sig: Take 1 tablet (20 mg total) by mouth at bedtime.    Dispense:  90 tablet    Refill:  1    Order Specific Question:   Supervising Provider    Answer:   Smitty CordsKARAMALEGOS, ALEXANDER J [[id number]]  . pantoprazole (PROTONIX) 40 MG tablet    Sig: Take 1 tablet (40 mg total) by mouth daily at 6 (six) AM.    Dispense:  90 tablet    Refill:  1    Order Specific Question:   Supervising Provider    Answer:   Smitty CordsKARAMALEGOS, ALEXANDER J [[id number]]    Follow up plan: Return in about 6 months (around 04/16/2019) for diabetes, hypertension, cholesterol.  Wilhelmina McardleLauren Annick Dimaio, DNP, AGPCNP-BC Adult Gerontology Primary Care Nurse Practitioner [facility] 10/14/2018, 8:42 AM

## 2018-10-14 NOTE — Patient Instructions (Addendum)
Margaret Mcintosh,   Thank you for coming in to clinic today.  1. Continue all medications without changes.  You are doing very well with your blood pressure and blood sugar control.  2. For feeling better overall, stay as active as you are able.  This will help with energy levels over time.  Please schedule a follow-up appointment with Cassell Smiles, AGNP. Return in about 6 months (around 04/16/2019) for diabetes, hypertension, cholesterol.  If you have any other questions or concerns, please feel free to call the clinic or send a message through [place]. You may also schedule an earlier appointment if necessary.  You will receive a survey after today's visit either digitally by e-mail or paper by [employer]. Your experiences and feedback matter to [place].  Please respond so we know how we are doing as we provide care for you.   Cassell Smiles, DNP, AGNP-BC Adult Gerontology Nurse Practitioner [place]

## 2018-10-15 LAB — COMPLETE METABOLIC PANEL WITH GFR
AG Ratio: 1.7 (calc) (ref 1.0–2.5)
ALT: 12 U/L (ref 6–29)
AST: 14 U/L (ref 10–35)
Albumin: 4.5 g/dL (ref 3.6–5.1)
Alkaline phosphatase (APISO): 54 U/L (ref 37–153)
BUN: 18 mg/dL (ref 7–25)
CO2: 26 mmol/L (ref 20–32)
Calcium: 9.9 mg/dL (ref 8.6–10.4)
Chloride: 106 mmol/L (ref 98–110)
Creat: 0.86 mg/dL (ref 0.60–0.88)
GFR, Est African American: 72 mL/min/{1.73_m2} (ref >=60)
GFR, Est Non African American: 62 mL/min/{1.73_m2} (ref >=60)
Globulin: 2.6 g/dL (calc) (ref 1.9–3.7)
Glucose, Bld: 128 mg/dL — ABNORMAL HIGH (ref 65–99)
Potassium: 4.2 mmol/L (ref 3.5–5.3)
Sodium: 140 mmol/L (ref 135–146)
Total Bilirubin: 0.7 mg/dL (ref 0.2–1.2)
Total Protein: 7.1 g/dL (ref 6.1–8.1)

## 2018-10-15 LAB — LIPID PANEL
Cholesterol: 143 mg/dL (ref <200)
HDL: 61 mg/dL (ref >=50)
LDL Cholesterol (Calc): 67 mg/dL (calc)
Non-HDL Cholesterol (Calc): 82 mg/dL (calc) (ref <130)
Total CHOL/HDL Ratio: 2.3 (calc) (ref <5.0)
Triglycerides: 66 mg/dL (ref <150)

## 2018-11-29 ENCOUNTER — Ambulatory Visit (INDEPENDENT_AMBULATORY_CARE_PROVIDER_SITE_OTHER): Payer: PPO | Admitting: Nurse Practitioner

## 2018-11-29 ENCOUNTER — Encounter: Payer: Self-pay | Admitting: Nurse Practitioner

## 2018-11-29 ENCOUNTER — Other Ambulatory Visit: Payer: Self-pay

## 2018-11-29 VITALS — BP 151/51 | HR 62 | Temp 98.3°F | Resp 16 | Ht 64.0 in | Wt 180.0 lb

## 2018-11-29 DIAGNOSIS — G8929 Other chronic pain: Secondary | ICD-10-CM

## 2018-11-29 DIAGNOSIS — R202 Paresthesia of skin: Secondary | ICD-10-CM

## 2018-11-29 DIAGNOSIS — M5442 Lumbago with sciatica, left side: Secondary | ICD-10-CM

## 2018-11-29 MED ORDER — GABAPENTIN 100 MG PO CAPS: 100.0000 mg | ORAL_CAPSULE | Freq: Every day | ORAL | 1 refills | Status: DC

## 2018-11-29 NOTE — Patient Instructions (Addendum)
Margaret Mcintosh,   Thank you for coming in to clinic today.  1. START your physical therapy  2. START gabapentin 100 mg capsule at bedtime.  Caution for drowsiness and dizziness. - May increase if needed to 100 mg twice daily (am and bedtime)  3. Use your cane every night and first in the morning.  Please schedule a follow-up appointment with Cassell Smiles, AGNP. Return 4-6 weeks if symptoms worsen or fail to improve.  If you have any other questions or concerns, please feel free to call the clinic or send a message through [place]. You may also schedule an earlier appointment if necessary.  You will receive a survey after today's visit either digitally by e-mail or paper by [employer]. Your experiences and feedback matter to [place].  Please respond so we know how we are doing as we provide care for you.   Cassell Smiles, DNP, AGNP-BC Adult Gerontology Nurse Practitioner [place]

## 2018-11-29 NOTE — Progress Notes (Signed)
Subjective:    Patient ID: Margaret Mcintosh, female    DOB: September 16, 1932, 85 y.o.   MRN: [id number]  Margaret Mcintosh is a 85 y.o. female presenting on 11/29/2018 for Leg Pain (Patient here today c/o of left leg pain x's 4 weeks. Patient reports pain is worse when walking. Patient denies any stiffness, swelling or redness. Patient reports taking Ibuprofen and medication for cramps. Patient reports OTC medications are not helping with pain.)   HPI LEFT leg pain Worse with walking and prolonged sitting, lying inbed.  Hurts more in hip when she is turning.   - Front of thigh hurts most.   - Lower leg cramps in calf.   - Has tingling from hip down to feet - No known recent injury - Past ankle fracture with screws at age 72.  No perceived weakness - difficulty  - Patient has not used gabapentin in past - Patient has tried OTC meds: leg cramp relief - [facility] brand is not helping  Ibuprofen not helpful   Social History   Tobacco Use  . Smoking status: Never Smoker  . Smokeless tobacco: Never Used  Substance Use Topics  . Alcohol use: No  . Drug use: No    Review of Systems Per HPI unless specifically indicated above     Objective:    BP (!) 151/51 (BP Location: Right Arm, Patient Position: Sitting, Cuff Size: Large)   Pulse 62   Temp 98.3 F (36.8 C) (Oral)   Resp 16   Ht 5\' 4"  (1.626 m)   Wt 180 lb (81.6 kg)   SpO2 97%   BMI 30.90 kg/m   Wt Readings from Last 3 Encounters:  11/29/18 180 lb (81.6 kg)  10/14/18 176 lb 12.8 oz (80.2 kg)  04/01/18 180 lb 6.4 oz (81.8 kg)    Physical Exam Vitals signs reviewed.  Constitutional:      General: She is not in acute distress.    Appearance: She is well-developed.  HENT:     Head: Normocephalic and atraumatic.  Cardiovascular:     Rate and Rhythm: Normal rate and regular rhythm.     Pulses:          Radial pulses are 2+ on the right side and 2+ on the left side.       Posterior tibial pulses are 1+ on the right side  and 1+ on the left side.     Heart sounds: Normal heart sounds, S1 normal and S2 normal.  Pulmonary:     Effort: Pulmonary effort is normal. No respiratory distress.     Breath sounds: Normal breath sounds and air entry.  Musculoskeletal:     Left hip: Normal. She exhibits normal range of motion, normal strength, no tenderness, no bony tenderness, no swelling, no crepitus, no deformity and no laceration.     Left knee: Normal. She exhibits normal range of motion, no swelling, no effusion, no ecchymosis, no deformity, no laceration, no erythema, normal alignment and normal patellar mobility. No tenderness found.     Lumbar back: She exhibits decreased range of motion, tenderness (paraspinal hypertonicity), pain and spasm. She exhibits no bony tenderness, no swelling, no edema, no deformity, no laceration and normal pulse.     Right lower leg: No edema.     Left lower leg: No edema.  Skin:    General: Skin is warm and dry.     Capillary Refill: Capillary refill takes less than 2 seconds.  Neurological:  Mental Status: She is alert and oriented to person, place, and time.  Psychiatric:        Attention and Perception: Attention normal.        Mood and Affect: Mood and affect normal.        Behavior: Behavior normal. Behavior is cooperative.    Results for orders placed or performed in visit on 10/14/18  COMPLETE METABOLIC PANEL WITH GFR  Result Value Ref Range   Glucose, Bld 128 (H) 65 - 99 mg/dL   BUN 18 7 - 25 mg/dL   Creat 1.610.86 0.960.60 - 0.450.88 mg/dL   GFR, Est Non African American 62 > OR = 60 mL/min/1.5373m2   GFR, Est African American 72 > OR = 60 mL/min/1.573m2   BUN/Creatinine Ratio NOT APPLICABLE 6 - 22 (calc)   Sodium 140 135 - 146 mmol/L   Potassium 4.2 3.5 - 5.3 mmol/L   Chloride 106 98 - 110 mmol/L   CO2 26 20 - 32 mmol/L   Calcium 9.9 8.6 - 10.4 mg/dL   Total Protein 7.1 6.1 - 8.1 g/dL   Albumin 4.5 3.6 - 5.1 g/dL   Globulin 2.6 1.9 - 3.7 g/dL (calc)   AG Ratio 1.7 1.0 -  2.5 (calc)   Total Bilirubin 0.7 0.2 - 1.2 mg/dL   Alkaline phosphatase (APISO) 54 37 - 153 U/L   AST 14 10 - 35 U/L   ALT 12 6 - 29 U/L  Lipid panel  Result Value Ref Range   Cholesterol 143 <200 mg/dL   HDL 61 > OR = 50 mg/dL   Triglycerides 66 <409<150 mg/dL   LDL Cholesterol (Calc) 67 mg/dL (calc)   Total CHOL/HDL Ratio 2.3 <5.0 (calc)   Non-HDL Cholesterol (Calc) 82 <811<130 mg/dL (calc)  POCT glycosylated hemoglobin (Hb A1C)  Result Value Ref Range   Hemoglobin A1C 6.4 (A) 4.0 - 5.6 %   HbA1c POC (<> result, manual entry)     HbA1c, POC (prediabetic range)     HbA1c, POC (controlled diabetic range)        Assessment & Plan:   Problem List Items Addressed This Visit    None    Visit Diagnoses    Chronic midline low back pain with left-sided sciatica    -  Primary   Relevant Medications   gabapentin (NEURONTIN) 100 MG capsule   Other Relevant Orders   Ambulatory referral to Physical Therapy   Paresthesia       Relevant Medications   gabapentin (NEURONTIN) 100 MG capsule     Chronic low back pain with sciatica.  Paresthesias of LEFT leg likely due to back pain.   Plan:  1. Treat with OTC pain meds (acetaminophen and ibuprofen OR Aleve).  Discussed alternate dosing and max dosing. - START gabapentin 100 mg at bedtime.  Increase to am and pm if tolerated and needing additional pain relief. Cautioned drowsiness. 2. Apply heat and/or ice to affected area. 3. May also apply a muscle rub with lidocaine or lidocaine patch after heat or ice. 4. Recommend physical therapy.  Order provided for [facility] to patient in office. 5. Follow up 4-6 weeks prn worsening    Meds ordered this encounter  Medications  . gabapentin (NEURONTIN) 100 MG capsule    Sig: Take 1 capsule (100 mg total) by mouth at bedtime. INCREASE to 1 capsule in am and at bedtime after 2 weeks if needed.    Dispense:  60 capsule    Refill:  1    Order Specific Question:   Supervising Provider     Answer:   Olin Hauser [[id number]]    Follow up plan: Return 4-6 weeks if symptoms worsen or fail to improve.  Cassell Smiles, DNP, AGPCNP-BC Adult Gerontology Primary Care Nurse Practitioner [facility] 11/29/2018, 3:55 PM

## 2018-12-06 ENCOUNTER — Encounter: Payer: Self-pay | Admitting: Nurse Practitioner

## 2018-12-10 DIAGNOSIS — M25552 Pain in left hip: Secondary | ICD-10-CM | POA: Diagnosis not present

## 2018-12-10 DIAGNOSIS — M545 Low back pain: Secondary | ICD-10-CM | POA: Diagnosis not present

## 2018-12-12 DIAGNOSIS — M25552 Pain in left hip: Secondary | ICD-10-CM | POA: Diagnosis not present

## 2018-12-12 DIAGNOSIS — M545 Low back pain: Secondary | ICD-10-CM | POA: Diagnosis not present

## 2018-12-16 DIAGNOSIS — M545 Low back pain: Secondary | ICD-10-CM | POA: Diagnosis not present

## 2018-12-16 DIAGNOSIS — M25552 Pain in left hip: Secondary | ICD-10-CM | POA: Diagnosis not present

## 2018-12-19 DIAGNOSIS — M25552 Pain in left hip: Secondary | ICD-10-CM | POA: Diagnosis not present

## 2018-12-19 DIAGNOSIS — M545 Low back pain: Secondary | ICD-10-CM | POA: Diagnosis not present

## 2018-12-23 DIAGNOSIS — M545 Low back pain: Secondary | ICD-10-CM | POA: Diagnosis not present

## 2018-12-23 DIAGNOSIS — M25552 Pain in left hip: Secondary | ICD-10-CM | POA: Diagnosis not present

## 2018-12-26 DIAGNOSIS — M25552 Pain in left hip: Secondary | ICD-10-CM | POA: Diagnosis not present

## 2018-12-26 DIAGNOSIS — M545 Low back pain: Secondary | ICD-10-CM | POA: Diagnosis not present

## 2018-12-30 ENCOUNTER — Telehealth: Payer: Self-pay | Admitting: Nurse Practitioner

## 2018-12-30 NOTE — Chronic Care Management (AMB) (Signed)
Chronic Care Management   Note  12/30/2018 Name: LYNETTA TOMCZAK MRN: [id number] DOB: 02-Feb-1933   KERAH HARDEBECK is a 85 y.o. year old female who is a primary care patient of Mikey College, NP. I reached out to Alessandra Grout by phone today in response to a referral sent by Ms. America Brown Stankovic's health plan.     Ms. Plouff was given information about Chronic Care Management services today including:  1. CCM service includes personalized support from designated clinical staff supervised by her physician, including individualized plan of care and coordination with other care providers 2. 24/7 contact phone numbers for assistance for urgent and routine care needs. 3. Service will only be billed when office clinical staff spend 20 minutes or more in a month to coordinate care. 4. Only one practitioner may furnish and bill the service in a calendar month. 5. The patient may stop CCM services at any time (effective at the end of the month) by phone call to the office staff. 6. The patient will be responsible for cost sharing (co-pay) of up to 20% of the service fee (after annual deductible is met).  Patient did not agree to enrollment in care management services and does not wish to consider at this time.  Follow up plan: The patient has been provided with contact information for the chronic care management team and has been advised to call with any health related questions or concerns.   [place]  [email or web address]   ??[phone]

## 2018-12-31 DIAGNOSIS — M25552 Pain in left hip: Secondary | ICD-10-CM | POA: Diagnosis not present

## 2018-12-31 DIAGNOSIS — M545 Low back pain: Secondary | ICD-10-CM | POA: Diagnosis not present

## 2019-01-02 DIAGNOSIS — M545 Low back pain: Secondary | ICD-10-CM | POA: Diagnosis not present

## 2019-01-02 DIAGNOSIS — M25552 Pain in left hip: Secondary | ICD-10-CM | POA: Diagnosis not present

## 2019-01-07 DIAGNOSIS — M25552 Pain in left hip: Secondary | ICD-10-CM | POA: Diagnosis not present

## 2019-01-07 DIAGNOSIS — M545 Low back pain: Secondary | ICD-10-CM | POA: Diagnosis not present

## 2019-02-19 ENCOUNTER — Other Ambulatory Visit: Payer: Self-pay

## 2019-04-18 ENCOUNTER — Ambulatory Visit: Payer: PPO | Admitting: Nurse Practitioner

## 2019-05-21 ENCOUNTER — Ambulatory Visit (INDEPENDENT_AMBULATORY_CARE_PROVIDER_SITE_OTHER): Payer: PPO | Admitting: Family Medicine

## 2019-05-21 ENCOUNTER — Encounter: Payer: Self-pay | Admitting: Family Medicine

## 2019-05-21 ENCOUNTER — Other Ambulatory Visit: Payer: Self-pay

## 2019-05-21 VITALS — BP 156/46 | HR 58 | Temp 97.1°F | Ht 64.0 in | Wt 178.8 lb

## 2019-05-21 DIAGNOSIS — K219 Gastro-esophageal reflux disease without esophagitis: Secondary | ICD-10-CM

## 2019-05-21 DIAGNOSIS — E782 Mixed hyperlipidemia: Secondary | ICD-10-CM

## 2019-05-21 DIAGNOSIS — S161XXA Strain of muscle, fascia and tendon at neck level, initial encounter: Secondary | ICD-10-CM

## 2019-05-21 DIAGNOSIS — E1165 Type 2 diabetes mellitus with hyperglycemia: Secondary | ICD-10-CM | POA: Diagnosis not present

## 2019-05-21 DIAGNOSIS — I10 Essential (primary) hypertension: Secondary | ICD-10-CM

## 2019-05-21 LAB — POCT GLYCOSYLATED HEMOGLOBIN (HGB A1C): Hemoglobin A1C: 6.4 % — AB (ref 4.0–5.6)

## 2019-05-21 NOTE — Assessment & Plan Note (Signed)
Stable, well controlled diabetic.  A1C in clinic today was 6.4%, last A1C prior was 09/2018 at 6.4%  Is currently taking Metformin 500mg  PO BID and tolerating well.  Plan: 1) Labs in 6 months  2) Continue metformin 500mg  2x a day 3) Take blood glucose readings regularly and write in a log.  Bring that log to your next appointment. 4) Low carb, low glycemic index diet and to exercise every other day for 30 minutes per day, going no more than 2 days in a row without exercise. 5) We will see you back in 6 months

## 2019-05-21 NOTE — Assessment & Plan Note (Signed)
No injury/trauma.  Is muscular and more than likely related to sleep.  Will look at pillows when she returns home to ensure that when she is laying on her side that her neck isn't extended or flexed and is in a neutral position. Discussed can use over the counter BenGay, IcyHot, Aspercreme or Biofreeze as needed as a topical and to be sure to wash hands well after application and avoid contact with eyes.

## 2019-05-21 NOTE — Assessment & Plan Note (Addendum)
BP well controlled. Currently taking Amlodipine, Labetolol and Losartan and tolerating well.   Plan: 1) Labs to be drawn in 6 months for next follow up 2) Continue amlodipine 10mg  daily, labetolol 100mg  3x a day and losartan 100mg  each morning and 50mg  each evening 3) Take blood pressure readings regularly and write in a log.  Bring that log to your next appointment. 4) Heart healthy diet and to exercise every other day for 30 minutes per day, going no more than 2 days in a row without exercise. 5) We will see you back in 6 months

## 2019-05-21 NOTE — Progress Notes (Addendum)
Subjective:    Patient ID: Margaret Mcintosh, female    DOB: 01-09-1933, 86 y.o.   MRN: [id number]  Margaret Mcintosh is a 86 y.o. female presenting on 05/21/2019 for Diabetes, Hypertension, Gastroesophageal Reflux, and Hyperlipidemia   HPI  Margaret Mcintosh is here for follow up on her Diabetes, Hypertension and GERD today.  Has been taking her Amlodipine, Labetolol, Losartan, Metformin, Pantoprazole, and Simvastatin and tolerating well.  Denies any headaches, visual changes, dizziness, lightheadedness, cough, SOB, CP, abdominal pain, n/v/d, numbness, tingling or weakness.  Depression screen [employer] 2/9 05/21/2019 09/28/2017 05/31/2017  Decreased Interest 0 0 0  Down, Depressed, Hopeless 0 0 0  PHQ - 2 Score 0 0 0  Altered sleeping - 0 0  Tired, decreased energy - 0 0  Change in appetite - 0 0  Feeling bad or failure about yourself  - 0 0  Trouble concentrating - 0 0  Moving slowly or fidgety/restless - 0 0  Suicidal thoughts - 0 0  PHQ-9 Score - 0 0  Difficult doing work/chores - - Not difficult at all    Social History   Tobacco Use  . Smoking status: Never Smoker  . Smokeless tobacco: Never Used  Substance Use Topics  . Alcohol use: No  . Drug use: No    Review of Systems  Constitutional: Negative for activity change, appetite change, fatigue, fever and unexpected weight change.  HENT: Negative.   Eyes: Negative for pain and visual disturbance.  Respiratory: Negative for cough, chest tightness and shortness of breath.   Cardiovascular: Negative for chest pain, palpitations and leg swelling.  Gastrointestinal: Negative for abdominal pain, constipation, diarrhea, nausea and vomiting.  Endocrine: Negative for polydipsia, polyphagia and polyuria.  Genitourinary: Negative for difficulty urinating, dysuria, frequency, hematuria and urgency.  Musculoskeletal: Positive for neck pain. Negative for back pain and gait problem.  Skin: Negative.   Neurological: Negative for dizziness,  tremors, speech difficulty, light-headedness, numbness and headaches.  Hematological: Negative for adenopathy. Does not bruise/bleed easily.  Psychiatric/Behavioral: Negative.    Diabetic Review of Systems - medication compliance: compliant all of the time, diabetic diet compliance: compliant all of the time, home glucose monitoring: is performed regularly, last eye exam approximately 1 year ago Per HPI unless specifically indicated above     Objective:    BP (!) 156/46   Pulse (!) 58   Temp (!) 97.1 F (36.2 C) (Temporal)   Ht 5\' 4"  (1.626 m)   Wt 178 lb 12.8 oz (81.1 kg)   BMI 30.69 kg/m   Wt Readings from Last 3 Encounters:  05/21/19 178 lb 12.8 oz (81.1 kg)  11/29/18 180 lb (81.6 kg)  10/14/18 176 lb 12.8 oz (80.2 kg)    Physical Exam Vitals reviewed.  Constitutional:      General: She is not in acute distress.    Appearance: Normal appearance. She is obese. She is not ill-appearing or toxic-appearing.  HENT:     Head: Normocephalic.  Eyes:     General: Lids are normal. Vision grossly intact.        Right eye: No discharge.        Left eye: No discharge.     Extraocular Movements: Extraocular movements intact.     Conjunctiva/sclera: Conjunctivae normal.     Pupils: Pupils are equal, round, and reactive to light.  Cardiovascular:     Rate and Rhythm: Normal rate and regular rhythm.     Pulses: Normal pulses.  Dorsalis pedis pulses are 2+ on the right side and 2+ on the left side.       Posterior tibial pulses are 2+ on the right side and 2+ on the left side.     Heart sounds: Normal heart sounds. No murmur. No friction rub. No gallop.   Pulmonary:     Effort: Pulmonary effort is normal. No respiratory distress.     Breath sounds: Normal breath sounds.  Abdominal:     General: Abdomen is flat. Bowel sounds are normal.     Palpations: Abdomen is soft.     Tenderness: There is no abdominal tenderness.  Musculoskeletal:     Cervical back: Normal range of  motion. Tenderness present. No swelling, edema, erythema, rigidity, spasms or torticollis. Normal range of motion.     Right lower leg: No edema.     Left lower leg: No edema.     Comments: Tenderness to right trapezius.  Feet:     Right foot:     Skin integrity: Skin integrity normal.     Toenail Condition: Right toenails are normal.     Left foot:     Skin integrity: Skin integrity normal.     Toenail Condition: Left toenails are normal.     Comments: Normal monofilament exam Skin:    General: Skin is warm and dry.     Capillary Refill: Capillary refill takes less than 2 seconds.  Neurological:     General: No focal deficit present.     Mental Status: She is alert and oriented to person, place, and time.     Cranial Nerves: Cranial nerves are intact. No cranial nerve deficit.     Sensory: Sensation is intact. No sensory deficit.     Motor: Motor function is intact. No weakness.  Psychiatric:        Attention and Perception: Attention and perception normal.        Mood and Affect: Mood and affect normal.        Speech: Speech normal.        Behavior: Behavior normal. Behavior is cooperative.        Thought Content: Thought content normal.        Cognition and Memory: Cognition and memory normal.        Judgment: Judgment normal.    Results for orders placed or performed in visit on 05/21/19  POCT glycosylated hemoglobin (Hb A1C)  Result Value Ref Range   Hemoglobin A1C 6.4 (A) 4.0 - 5.6 %      Assessment & Plan:   Problem List Items Addressed This Visit      Cardiovascular and Mediastinum   Essential hypertension    BP well controlled. Currently taking Labetolol and Losartan and tolerating well.   Plan: 1) Labs to be drawn in 6 months for next follow up 2) Continue labetolol 100mg  3x a day and losartan 100mg  each morning and 50mg  each evening 3) Take blood pressure readings regularly and write in a log.  Bring that log to your next appointment. 4) Heart healthy diet  and to exercise every other day for 30 minutes per day, going no more than 2 days in a row without exercise. 5) We will see you back in 6 months        Digestive   GERD (gastroesophageal reflux disease)    Currently taking pantoprazole 40mg  daily and tolerating it well.   Plan: 1) Continue pantoprazole 40mg  daily 2) Continue to avoid spicy & acidic  foods and avoid eating 2 hours before bed 3) We will see you back in 6 months        Endocrine   Diabetes (HCC) - Primary    Stable, well controlled diabetic.  A1C in clinic today was 6.4%, last A1C prior was 09/2018 at 6.4%  Is currently taking Metformin 500mg  PO BID and tolerating well.  Plan: 1) Labs in 6 months  2) Continue metformin 500mg  2x a day 3) Take blood glucose readings regularly and write in a log.  Bring that log to your next appointment. 4) Low carb, low glycemic index diet and to exercise every other day for 30 minutes per day, going no more than 2 days in a row without exercise. 5) We will see you back in 6 months      Relevant Orders   POCT glycosylated hemoglobin (Hb A1C) (Completed)     Musculoskeletal and Integument   Neck strain, initial encounter    No injury/trauma.  Is muscular and more than likely related to sleep.  Will look at pillows when she returns home to ensure that when she is laying on her side that her neck isn't extended or flexed and is in a neutral position. Discussed can use over the counter BenGay, IcyHot, Aspercreme or Biofreeze as needed as a topical and to be sure to wash hands well after application and avoid contact with eyes.        Other   Hyperlipemia    Currently taking simvastatin 20mg  daily and tolerating it well.   Plan: 1) Labs to be done in 6 months 2) Continue simvastatin 20mg  daily 3) Heart healthy diet and to exercise every other day for 30 minutes per day, going no more than 2 days in a row without exercise. 5) We will see you back in 6 months         No orders of  the defined types were placed in this encounter.   Diabetes Mellitus: well controlled, stable and no significant medication side effects noted  Follow up plan: Return in about 6 months (around 11/18/2019) for HTN, Hyperlipidemia, DM F/U - A1C & Labs.  , FNP Family Nurse Practitioner [facility] 05/21/2019, 10:33 AM

## 2019-05-21 NOTE — Patient Instructions (Addendum)
Try to get exercise a minimum of 30 minutes per day at least 5 days per week as well as  adequate water intake all while measuring blood pressure a few times per week.  Keep a blood pressure log and bring back to clinic at your next visit.  If your readings are consistently over 140/90 to contact our office/send me a MyChart message and we will see you sooner.  Can try DASH and Mediterranean diet options, avoiding processed foods, lowering sodium intake, avoiding pork products, and eating a plant based diet for optimal health.  Reminded to have yearly dilated eye exams, daily feet check, and following a heart healthy diet (low fat, low salt, low carb and protein control) along with daily exercise.  Eat a plant based diet for optimal health.  Advised to avoid juices, sodas, rice, pasta, potatoes and bread and to get at least 30 minutes of exercise 5x a week.  Other than as above, no change in treatment plans.    Establish with local podiatrist [facility] [address] [place]  Phone# [phone]  You will receive a survey after today's visit either digitally by e-mail or paper by USPS mail. Your experiences and feedback matter to [place].  Please respond so we know how we are doing as we provide care for you.  Call us with any questions/concerns/needs.  It is my goal to be available to you for your health concerns.  Thanks for choosing me to be a partner in your healthcare needs!  Charlaine Dalton, FNP-C Family Nurse Practitioner [facility] Phone: [phone]

## 2019-05-21 NOTE — Assessment & Plan Note (Signed)
Currently taking simvastatin 20mg  daily and tolerating it well.   Plan: 1) Labs to be done in 6 months 2) Continue simvastatin 20mg  daily 3) Heart healthy diet and to exercise every other day for 30 minutes per day, going no more than 2 days in a row without exercise. 5) We will see you back in 6 months

## 2019-05-21 NOTE — Assessment & Plan Note (Signed)
Currently taking pantoprazole 40mg  daily and tolerating it well.   Plan: 1) Continue pantoprazole 40mg  daily 2) Continue to avoid spicy & acidic foods and avoid eating 2 hours before bed 3) We will see you back in 6 months

## 2019-05-21 NOTE — Progress Notes (Signed)
I have reviewed this encounter including the documentation in this note and/or discussed this patient with the provider, Danielle Rankin FNP. I am certifying that I agree with the content of this note as supervising physician.  Saralyn Pilar, DO [facility] 05/21/2019, 1:12 PM

## 2019-06-01 ENCOUNTER — Other Ambulatory Visit: Payer: Self-pay | Admitting: Nurse Practitioner

## 2019-06-01 DIAGNOSIS — I10 Essential (primary) hypertension: Secondary | ICD-10-CM

## 2019-06-25 ENCOUNTER — Other Ambulatory Visit: Payer: Self-pay | Admitting: Nurse Practitioner

## 2019-06-25 DIAGNOSIS — K219 Gastro-esophageal reflux disease without esophagitis: Secondary | ICD-10-CM

## 2019-07-10 ENCOUNTER — Other Ambulatory Visit: Payer: Self-pay | Admitting: Nurse Practitioner

## 2019-07-10 DIAGNOSIS — E782 Mixed hyperlipidemia: Secondary | ICD-10-CM

## 2019-08-31 ENCOUNTER — Other Ambulatory Visit: Payer: Self-pay | Admitting: Family Medicine

## 2019-08-31 DIAGNOSIS — I10 Essential (primary) hypertension: Secondary | ICD-10-CM

## 2019-08-31 NOTE — Telephone Encounter (Signed)
Requested Prescriptions  Pending Prescriptions Disp Refills  . labetalol (NORMODYNE) 100 MG tablet [Pharmacy Med Name: Labetalol HCl 100 MG Oral Tablet] 270 tablet 0    Sig: TAKE 1 TABLET BY MOUTH THREE TIMES DAILY     Cardiovascular:  Beta Blockers Failed - 08/31/2019  8:12 AM      Failed - Last BP in normal range    BP Readings from Last 1 Encounters:  05/21/19 (!) 156/46         Failed - Valid encounter within last 6 months    Recent Outpatient Visits          3 months ago Type 2 diabetes mellitus with hyperglycemia, without long-term current use of insulin [facility])   [facility], Jodelle Gross, FNP   9 months ago Chronic midline low back pain with left-sided sciatica   [facility] Galen Manila, NP   10 months ago Type 2 diabetes mellitus with hyperglycemia, without long-term current use of insulin [facility])   [facility], Alison Stalling, NP   1 year ago Type 2 diabetes mellitus with hyperglycemia, without long-term current use of insulin [facility])   [facility] Rudd, Alison Stalling, NP   1 year ago Type 2 diabetes mellitus without complication, without long-term current use of insulin [facility])   [facility] Kyung Rudd, Alison Stalling, NP      Future Appointments            In 2 months Malfi, Jodelle Gross, FNP [facility]           Passed - Last Heart Rate in normal range    Pulse Readings from Last 1 Encounters:  05/21/19 (!) 58

## 2019-09-03 ENCOUNTER — Other Ambulatory Visit: Payer: Self-pay | Admitting: Family Medicine

## 2019-09-03 DIAGNOSIS — I10 Essential (primary) hypertension: Secondary | ICD-10-CM

## 2019-09-03 NOTE — Telephone Encounter (Signed)
Requested Prescriptions  Pending Prescriptions Disp Refills   labetalol (NORMODYNE) 100 MG tablet [Pharmacy Med Name: Labetalol HCl 100 MG Oral Tablet] 270 tablet 0    Sig: TAKE 1 TABLET BY MOUTH THREE TIMES DAILY     Cardiovascular:  Beta Blockers Failed - 09/03/2019  1:31 PM      Failed - Last BP in normal range    BP Readings from Last 1 Encounters:  05/21/19 (!) 156/46         Failed - Valid encounter within last 6 months    Recent Outpatient Visits          3 months ago Type 2 diabetes mellitus with hyperglycemia, without long-term current use of insulin [facility])   [facility], Jodelle Gross, FNP   9 months ago Chronic midline low back pain with left-sided sciatica   [facility] Galen Manila, NP   10 months ago Type 2 diabetes mellitus with hyperglycemia, without long-term current use of insulin [facility])   [facility] Kyung Rudd, Alison Stalling, NP   1 year ago Type 2 diabetes mellitus with hyperglycemia, without long-term current use of insulin [facility])   [facility] Kyung Rudd, Alison Stalling, NP   1 year ago Type 2 diabetes mellitus without complication, without long-term current use of insulin [facility])   [facility] Kyung Rudd, Alison Stalling, NP      Future Appointments            In 2 months Malfi, Jodelle Gross, FNP [facility]           Passed - Last Heart Rate in normal range    Pulse Readings from Last 1 Encounters:  05/21/19 (!) 58

## 2019-09-15 ENCOUNTER — Other Ambulatory Visit: Payer: Self-pay | Admitting: Nurse Practitioner

## 2019-09-15 DIAGNOSIS — I10 Essential (primary) hypertension: Secondary | ICD-10-CM

## 2019-09-15 NOTE — Telephone Encounter (Signed)
Requested medication (s) are due for refill today: yes  Requested medication (s) are on the active medication list: yes  Last refill:  07/08/2018 #90 3 refills   Future visit scheduled: yes  Notes to clinic:  prescription expired      Requested Prescriptions  Pending Prescriptions Disp Refills   amLODipine (NORVASC) 10 MG tablet [Pharmacy Med Name: amLODIPine Besylate 10 MG Oral Tablet] 90 tablet 0    Sig: Take 1 tablet by mouth once daily      Cardiovascular:  Calcium Channel Blockers Failed - 09/15/2019  2:00 PM      Failed - Last BP in normal range    BP Readings from Last 1 Encounters:  05/21/19 (!) 156/46          Failed - Valid encounter within last 6 months    Recent Outpatient Visits           3 months ago Type 2 diabetes mellitus with hyperglycemia, without long-term current use of insulin (HCC)   [facility], Jodelle Gross, FNP   9 months ago Chronic midline low back pain with left-sided sciatica   [facility] Galen Manila, NP   11 months ago Type 2 diabetes mellitus with hyperglycemia, without long-term current use of insulin [facility])   [facility] Kyung Rudd, Alison Stalling, NP   1 year ago Type 2 diabetes mellitus with hyperglycemia, without long-term current use of insulin [facility])   [facility] Kyung Rudd, Alison Stalling, NP   1 year ago Type 2 diabetes mellitus without complication, without long-term current use of insulin (HCC)   [facility] Kyung Rudd, Alison Stalling, NP       Future Appointments             In 2 months Malfi, Jodelle Gross, FNP [facility], [facility]

## 2019-09-18 ENCOUNTER — Other Ambulatory Visit: Payer: Self-pay | Admitting: Nurse Practitioner

## 2019-09-18 DIAGNOSIS — I10 Essential (primary) hypertension: Secondary | ICD-10-CM

## 2019-09-19 ENCOUNTER — Telehealth: Payer: Self-pay | Admitting: Family Medicine

## 2019-09-19 ENCOUNTER — Other Ambulatory Visit: Payer: Self-pay | Admitting: Nurse Practitioner

## 2019-09-19 DIAGNOSIS — I10 Essential (primary) hypertension: Secondary | ICD-10-CM

## 2019-09-19 NOTE — Telephone Encounter (Signed)
Medication: amLODipine (NORVASC) 10 MG tablet [[id number]] Pharmacy did not receive the script. Can this script me resent. Patient will out of medication tomorrow.  Has the patient contacted their pharmacy? Yes (Agent: If no, request that the patient contact the pharmacy for the refill.) (Agent: If yes, when and what did the pharmacy advise?)  Preferred Pharmacy (with phone number or street name): [facility] [address] [place]), [place] - [address]  Phone:  [phone] Fax:  [phone]     Agent: Please be advised that RX refills may take up to 3 business days. We ask that you follow-up with your pharmacy.

## 2019-09-20 ENCOUNTER — Other Ambulatory Visit: Payer: Self-pay | Admitting: Nurse Practitioner

## 2019-09-20 DIAGNOSIS — I10 Essential (primary) hypertension: Secondary | ICD-10-CM

## 2019-09-22 NOTE — Telephone Encounter (Signed)
Called [facility] pt has already picked up meds.   KP

## 2019-09-23 ENCOUNTER — Telehealth: Payer: Self-pay

## 2019-09-23 ENCOUNTER — Ambulatory Visit: Payer: PPO

## 2019-09-23 NOTE — Telephone Encounter (Signed)
This nurse attempted to contact patient three times in order to perform telephonic AWV. Called at 8:55, 9:00 and 9:10. Message left to call office to reschedule.

## 2019-09-30 ENCOUNTER — Ambulatory Visit (INDEPENDENT_AMBULATORY_CARE_PROVIDER_SITE_OTHER): Payer: PPO

## 2019-09-30 VITALS — Ht 64.0 in | Wt 175.0 lb

## 2019-09-30 DIAGNOSIS — Z0000 Encounter for general adult medical examination without abnormal findings: Secondary | ICD-10-CM

## 2019-09-30 NOTE — Patient Instructions (Signed)
Margaret Mcintosh , Thank you for taking time to come for your Medicare Wellness Visit. I appreciate your ongoing commitment to your health goals. Please review the following plan we discussed and let me know if I can assist you in the future.   Screening recommendations/referrals: Colonoscopy: not required Mammogram: not required Bone Density: patient states she had Recommended yearly ophthalmology/optometry visit for glaucoma screening and checkup Recommended yearly dental visit for hygiene and checkup  Vaccinations: Influenza vaccine: completed 01/03/2019, due 10/26/2019 Pneumococcal vaccine: completed 02/28/2016 Tdap vaccine: patient states she had Shingles vaccine: discussed   Covid-19:05/07/2019, 06/10/2019  Advanced directives: Advance directive discussed with you today. Even though you declined this today please call our office should you change your mind and we can give you the proper paperwork for you to fill out.  Conditions/risks identified: none  Next appointment: Follow up in one year for your annual wellness visit    Preventive Care 65 Years and Older, Female Preventive care refers to lifestyle choices and visits with your health care provider that can promote health and wellness. What does preventive care include?  A yearly physical exam. This is also called an annual well check.  Dental exams once or twice a year.  Routine eye exams. Ask your health care provider how often you should have your eyes checked.  Personal lifestyle choices, including:  Daily care of your teeth and gums.  Regular physical activity.  Eating a healthy diet.  Avoiding tobacco and drug use.  Limiting alcohol use.  Practicing safe sex.  Taking low-dose aspirin every day.  Taking vitamin and mineral supplements as recommended by your health care provider. What happens during an annual well check? The services and screenings done by your health care provider during your annual well check  will depend on your age, overall health, lifestyle risk factors, and family history of disease. Counseling  Your health care provider may ask you questions about your:  Alcohol use.  Tobacco use.  Drug use.  Emotional well-being.  Home and relationship well-being.  Sexual activity.  Eating habits.  History of falls.  Memory and ability to understand (cognition).  Work and work Astronomer.  Reproductive health. Screening  You may have the following tests or measurements:  Height, weight, and BMI.  Blood pressure.  Lipid and cholesterol levels. These may be checked every 5 years, or more frequently if you are over 66 years old.  Skin check.  Lung cancer screening. You may have this screening every year starting at age 71 if you have a 30-pack-year history of smoking and currently smoke or have quit within the past 15 years.  Fecal occult blood test (FOBT) of the stool. You may have this test every year starting at age 90.  Flexible sigmoidoscopy or colonoscopy. You may have a sigmoidoscopy every 5 years or a colonoscopy every 10 years starting at age 36.  Hepatitis C blood test.  Hepatitis B blood test.  Sexually transmitted disease (STD) testing.  Diabetes screening. This is done by checking your blood sugar (glucose) after you have not eaten for a while (fasting). You may have this done every 1-3 years.  Bone density scan. This is done to screen for osteoporosis. You may have this done starting at age 58.  Mammogram. This may be done every 1-2 years. Talk to your health care provider about how often you should have regular mammograms. Talk with your health care provider about your test results, treatment options, and if necessary, the need for  more tests. Vaccines  Your health care provider may recommend certain vaccines, such as:  Influenza vaccine. This is recommended every year.  Tetanus, diphtheria, and acellular pertussis (Tdap, Td) vaccine. You may  need a Td booster every 10 years.  Zoster vaccine. You may need this after age 58.  Pneumococcal 13-valent conjugate (PCV13) vaccine. One dose is recommended after age 43.  Pneumococcal polysaccharide (PPSV23) vaccine. One dose is recommended after age 45. Talk to your health care provider about which screenings and vaccines you need and how often you need them. This information is not intended to replace advice given to you by your health care provider. Make sure you discuss any questions you have with your health care provider. Document Released: 04/09/2015 Document Revised: 12/01/2015 Document Reviewed: 01/12/2015 Elsevier Interactive Patient Education  2017 [place] Prevention in the Home Falls can cause injuries. They can happen to people of all ages. There are many things you can do to make your home safe and to help prevent falls. What can I do on the outside of my home?  Regularly fix the edges of walkways and driveways and fix any cracks.  Remove anything that might make you trip as you walk through a door, such as a raised step or threshold.  Trim any bushes or trees on the path to your home.  Use bright outdoor lighting.  Clear any walking paths of anything that might make someone trip, such as rocks or tools.  Regularly check to see if handrails are loose or broken. Make sure that both sides of any steps have handrails.  Any raised decks and porches should have guardrails on the edges.  Have any leaves, snow, or ice cleared regularly.  Use sand or salt on walking paths during winter.  Clean up any spills in your garage right away. This includes oil or grease spills. What can I do in the bathroom?  Use night lights.  Install grab bars by the toilet and in the tub and shower. Do not use towel bars as grab bars.  Use non-skid mats or decals in the tub or shower.  If you need to sit down in the shower, use a plastic, non-slip stool.  Keep the floor  dry. Clean up any water that spills on the floor as soon as it happens.  Remove soap buildup in the tub or shower regularly.  Attach bath mats securely with double-sided non-slip rug tape.  Do not have throw rugs and other things on the floor that can make you trip. What can I do in the bedroom?  Use night lights.  Make sure that you have a light by your bed that is easy to reach.  Do not use any sheets or blankets that are too big for your bed. They should not hang down onto the floor.  Have a firm chair that has side arms. You can use this for support while you get dressed.  Do not have throw rugs and other things on the floor that can make you trip. What can I do in the kitchen?  Clean up any spills right away.  Avoid walking on wet floors.  Keep items that you use a lot in easy-to-reach places.  If you need to reach something above you, use a strong step stool that has a grab bar.  Keep electrical cords out of the way.  Do not use floor polish or wax that makes floors slippery. If you must use wax, use non-skid  floor wax.  Do not have throw rugs and other things on the floor that can make you trip. What can I do with my stairs?  Do not leave any items on the stairs.  Make sure that there are handrails on both sides of the stairs and use them. Fix handrails that are broken or loose. Make sure that handrails are as long as the stairways.  Check any carpeting to make sure that it is firmly attached to the stairs. Fix any carpet that is loose or worn.  Avoid having throw rugs at the top or bottom of the stairs. If you do have throw rugs, attach them to the floor with carpet tape.  Make sure that you have a light switch at the top of the stairs and the bottom of the stairs. If you do not have them, ask someone to add them for you. What else can I do to help prevent falls?  Wear shoes that:  Do not have high heels.  Have rubber bottoms.  Are comfortable and fit you  well.  Are closed at the toe. Do not wear sandals.  If you use a stepladder:  Make sure that it is fully opened. Do not climb a closed stepladder.  Make sure that both sides of the stepladder are locked into place.  Ask someone to hold it for you, if possible.  Clearly mark and make sure that you can see:  Any grab bars or handrails.  First and last steps.  Where the edge of each step is.  Use tools that help you move around (mobility aids) if they are needed. These include:  Canes.  Walkers.  Scooters.  Crutches.  Turn on the lights when you go into a dark area. Replace any light bulbs as soon as they burn out.  Set up your furniture so you have a clear path. Avoid moving your furniture around.  If any of your floors are uneven, fix them.  If there are any pets around you, be aware of where they are.  Review your medicines with your doctor. Some medicines can make you feel dizzy. This can increase your chance of falling. Ask your doctor what other things that you can do to help prevent falls. This information is not intended to replace advice given to you by your health care provider. Make sure you discuss any questions you have with your health care provider. Document Released: 01/07/2009 Document Revised: 08/19/2015 Document Reviewed: 04/17/2014 Elsevier Interactive Patient Education  2017 [employer].

## 2019-09-30 NOTE — Progress Notes (Addendum)
I connected with Margaret Mcintosh today by telephone and verified that I am speaking with the correct person using two identifiers. Location patient: home Location provider: work Persons participating in the virtual visit: Lauralee Evener LPN.   I discussed the limitations, risks, security and privacy concerns of performing an evaluation and management service by telephone and the availability of in person appointments. I also discussed with the patient that there may be a patient responsible charge related to this service. The patient expressed understanding and verbally consented to this telephonic visit.    Interactive audio and video telecommunications were attempted between this provider and patient, however failed, due to patient having technical difficulties OR patient did not have access to video capability.  We continued and completed visit with audio only.  Vital signs maybe patient reported or missing.     Subjective:   Margaret Mcintosh is a 86 y.o. female who presents for Medicare Annual (Subsequent) preventive examination.  Review of Systems     Cardiac Risk Factors include: advanced age (>73men, >70 women);diabetes mellitus;dyslipidemia;hypertension;obesity (BMI >30kg/m2);sedentary lifestyle     Objective:    Today's Vitals   09/30/19 0858  Weight: 175 lb (79.4 kg)  Height: 5\' 4"  (1.626 m)   Body mass index is 30.04 kg/m.  Advanced Directives 09/30/2019 06/12/2017 03/07/2016 02/08/2016 11/04/2015 11/04/2015 11/04/2015  Does Patient Have a Medical Advance Directive? No Yes Yes Yes Yes Yes Yes  Type of Advance Directive - Healthcare Power of 01/04/2016 Power of [place];Living will Healthcare Power of [place];Living will - Living will Living will  Copy of Healthcare Power of Attorney in Chart? - Yes No - copy requested No - copy requested - No - copy requested No - copy requested  Would patient like information on creating a medical advance  directive? No - Patient declined - - - Yes - Educational materials given - -    Current Medications (verified) Outpatient Encounter Medications as of 09/30/2019  Medication Sig  . acetaminophen (TYLENOL) 325 MG tablet Take 325-650 mg by mouth every 6 (six) hours as needed for mild pain.  12/01/2019 amLODipine (NORVASC) 10 MG tablet Take 1 tablet by mouth once daily  . Cholecalciferol (VITAMIN D3 PO) Take by mouth.  Marland Kitchen FREESTYLE LITE test strip 1 each by Other route daily.  Marland Kitchen gabapentin (NEURONTIN) 100 MG capsule Take 1 capsule (100 mg total) by mouth at bedtime. INCREASE to 1 capsule in am and at bedtime after 2 weeks if needed.  . labetalol (NORMODYNE) 100 MG tablet TAKE 1 TABLET BY MOUTH THREE TIMES DAILY  . losartan (COZAAR) 50 MG tablet Take 2 tablets (100 mg total) by mouth every morning AND 1 tablet (50 mg total) every evening.  . metFORMIN (GLUCOPHAGE) 500 MG tablet Take 1 tablet (500 mg total) by mouth 2 (two) times daily with a meal.  . Omega-3 Fatty Acids (FISH OIL) 1200 MG CAPS Take by mouth 3 (three) times daily.  . pantoprazole (PROTONIX) 40 MG tablet TAKE 1 TABLET BY MOUTH ONCE DAILY AT 6AM  . simvastatin (ZOCOR) 20 MG tablet TAKE 1 TABLET BY MOUTH AT BEDTIME  . Elastic Bandages & Supports (MEDICAL COMPRESSION SOCKS) MISC 2 Devices by Does not apply route daily. (Patient not taking: Reported on 09/30/2019)   No facility-administered encounter medications on file as of 09/30/2019.    Allergies (verified) Codeine, Diphenhydramine hcl, and Other   History: Past Medical History:  Diagnosis Date  . Arthritis   . Cataract   .  Complication of anesthesia    "takes a long time to wake me up" (11/04/2015)  . Diabetes mellitus (HCC)   . GERD (gastroesophageal reflux disease)   . Glaucoma   . Hypertension   . Stroke [facility])    last 8/17. Mild left arm/leg weakness  . TIA (transient ischemic attack) 11/04/2015   Past Surgical History:  Procedure Laterality Date  . ANKLE FRACTURE SURGERY Left  2000   "screw is still there" (11/04/2015)  . APPENDECTOMY  ~1960  . BROW LIFT Bilateral 06/12/2017   Procedure: BLEPHAROPLASTY UPPER EYELID WITH EXCESS SKIN DIABETIC;  Surgeon: Imagene Riches, MD;  Location: [facility];  Service: Ophthalmology;  Laterality: Bilateral;  Diabetic - oral meds  . CATARACT EXTRACTION W/PHACO Right 02/08/2016   Procedure: CATARACT EXTRACTION PHACO AND INTRAOCULAR LENS PLACEMENT (IOC);  Surgeon: Nevada Crane, MD;  Location: [facility];  Service: Ophthalmology;  Laterality: Right;  DIABETIC - oral meds RIGHT  . CATARACT EXTRACTION W/PHACO Left 03/07/2016   Procedure: CATARACT EXTRACTION PHACO AND INTRAOCULAR LENS PLACEMENT (IOC);  Surgeon: Nevada Crane, MD;  Location: [facility];  Service: Ophthalmology;  Laterality: Left;  DIABETIC - oral meds LEFT  . FRACTURE SURGERY    . VAGINAL HYSTERECTOMY  1973   Family History  Problem Relation Age of Onset  . Hypertension Mother   . Diabetes Mother   . Heart disease Mother   . Cancer Sister   . Diabetes Brother   . Hypertension Daughter   . Other Son        Pituitary gland disease  . Hypertension Son   . Obesity Son   . Colon cancer Brother   . Diabetes Brother   . Alzheimer's disease Brother   . Diabetes Sister    Social History   Socioeconomic History  . Marital status: Widowed    Spouse name: Not on file  . Number of children: 2  . Years of education: Not on file  . Highest education level: Not on file  Occupational History  . Occupation: Retired  Tobacco Use  . Smoking status: Never Smoker  . Smokeless tobacco: Never Used  Vaping Use  . Vaping Use: Never used  Substance and Sexual Activity  . Alcohol use: No  . Drug use: No  . Sexual activity: Not Currently  Other Topics Concern  . Not on file  Social History Narrative  . Not on file   Social Determinants of Health   Financial Resource Strain: Low Risk   . Difficulty of Paying Living Expenses: Not hard  at all  Food Insecurity: No Food Insecurity  . Worried About Programme researcher, broadcasting/film/video in the Last Year: Never true  . Ran Out of Food in the Last Year: Never true  Transportation Needs: No Transportation Needs  . Lack of Transportation (Medical): No  . Lack of Transportation (Non-Medical): No  Physical Activity: Inactive  . Days of Exercise per Week: 0 days  . Minutes of Exercise per Session: 0 min  Stress: Stress Concern Present  . Feeling of Stress : To some extent  Social Connections:   . Frequency of Communication with Friends and Family:   . Frequency of Social Gatherings with Friends and Family:   . Attends Religious Services:   . Active Member of Clubs or Organizations:   . Attends Banker Meetings:   Marland Kitchen Marital Status:     Tobacco Counseling Counseling given: Not Answered   Clinical Intake:  Pre-visit preparation  completed: Yes  Pain : No/denies pain     Nutritional Status: BMI > 30  Obese Nutritional Risks: None Diabetes: Yes  How often do you need to have someone help you when you read instructions, pamphlets, or other written materials from your doctor or pharmacy?: 1 - Never What is the last grade level you completed in school?: 12th grdae  Diabetic? Yes Nutrition Risk Assessment:  Has the patient had any N/V/D within the last 2 months?  No  Does the patient have any non-healing wounds?  No  Has the patient had any unintentional weight loss or weight gain?  No   Diabetes:  Is the patient diabetic?  Yes  If diabetic, was a CBG obtained today?  No  Did the patient bring in their glucometer from home?  No  How often do you monitor your CBG's? Does not check.   Financial Strains and Diabetes Management:  Are you having any financial strains with the device, your supplies or your medication? No .  Does the patient want to be seen by Chronic Care Management for management of their diabetes?  No  Would the patient like to be referred to a  Nutritionist or for Diabetic Management?  No   Diabetic Exams:  Diabetic Eye Exam: Overdue for diabetic eye exam. Pt has been advised about the importance in completing this exam. Patient advised to call and schedule an eye exam. Diabetic Foot Exam: Overdue, Pt has been advised about the importance in completing this exam. Pt is scheduled for diabetic foot exam on next appointment.   Interpreter Needed?: No  Information entered by :: NAllen LPN   Activities of Daily Living In your present state of health, do you have any difficulty performing the following activities: 09/30/2019  Hearing? N  Vision? N  Difficulty concentrating or making decisions? N  Walking or climbing stairs? N  Dressing or bathing? N  Doing errands, shopping? N  Preparing Food and eating ? N  Using the Toilet? N  In the past six months, have you accidently leaked urine? Y  Comment wears a pad  Do you have problems with loss of bowel control? N  Managing your Medications? N  Managing your Finances? N  Housekeeping or managing your Housekeeping? N  Some recent data might be hidden    Patient Care Team: Malfi, Jodelle Gross, FNP as PCP - General (Family Medicine)  Indicate any recent Medical Services you may have received from other than [facility] providers in the past year (date may be approximate).     Assessment:   This is a routine wellness examination for [facility].  Hearing/Vision screen  Hearing Screening   125Hz  250Hz  500Hz  1000Hz  2000Hz  3000Hz  4000Hz  6000Hz  8000Hz   Right ear:           Left ear:           Vision Screening Comments: Regular eye exams. [facility]  Dietary issues and exercise activities discussed: Current Exercise Habits: The patient does not participate in regular exercise at present  Goals    . Patient Stated     09/30/2019, stay healthy      Depression Screen PHQ 2/9 Scores 09/30/2019 05/21/2019 09/28/2017 05/31/2017 11/04/2015  PHQ - 2 Score 0 0 0 0 0  PHQ- 9 Score - - 0 0 -      Fall Risk Fall Risk  09/30/2019 02/19/2019 09/28/2017 01/17/2016 11/04/2015  Falls in the past year? 0 0 No No Yes  Comment - Emmi  Telephone Survey: data to providers prior to load - - -  Number falls in past yr: - - - - 1  Injury with Fall? - - - - No  Risk for fall due to : Medication side effect - - - History of fall(s);Impaired mobility  Follow up Falls evaluation completed;Education provided;Falls prevention discussed - - - Falls evaluation completed;Education provided;Falls prevention discussed    Any stairs in or around the home? Yes  If so, are there any without handrails? Yes  Home free of loose throw rugs in walkways, pet beds, electrical cords, etc? Yes  Adequate lighting in your home to reduce risk of falls? Yes   ASSISTIVE DEVICES UTILIZED TO PREVENT FALLS:  Life alert? No  Use of a cane, walker or w/c? Yes  Grab bars in the bathroom? Yes  Shower chair or bench in shower? Yes  Elevated toilet seat or a handicapped toilet? No   TIMED UP AND GO:  Was the test performed? No .    Cognitive Function:     6CIT Screen 09/30/2019  What Year? 0 points  What month? 0 points  What time? 0 points  Count back from 20 0 points  Months in reverse 0 points  Repeat phrase 4 points  Total Score 4    Immunizations Immunization History  Administered Date(s) Administered  . Influenza, High Dose Seasonal PF 12/31/2017  . Influenza,inj,Quad PF,6+ Mos 01/31/2016, 02/12/2017  . Influenza-Unspecified 01/03/2019  . Moderna SARS-COVID-2 Vaccination 05/07/2019, 06/10/2019  . Pneumococcal Conjugate-13 02/28/2016  . Pneumococcal Polysaccharide-23 04/21/2013    TDAP status: Due, Education has been provided regarding the importance of this vaccine. Advised may receive this vaccine at local pharmacy or Health Dept. Aware to provide a copy of the vaccination record if obtained from local pharmacy or Health Dept. Verbalized acceptance and understanding. Flu Vaccine status: Up to  date Pneumococcal vaccine status: Up to date Covid-19 vaccine status: Completed vaccines  Qualifies for Shingles Vaccine? Yes   Zostavax completed No   Shingrix Completed?: No.    Education has been provided regarding the importance of this vaccine. Patient has been advised to call insurance company to determine out of pocket expense if they have not yet received this vaccine. Advised may also receive vaccine at local pharmacy or Health Dept. Verbalized acceptance and understanding.  Screening Tests Health Maintenance  Topic Date Due  . TETANUS/TDAP  Never done  . DEXA SCAN  Never done  . OPHTHALMOLOGY EXAM  01/24/2018  . FOOT EXAM  06/01/2018  . INFLUENZA VACCINE  10/26/2019  . HEMOGLOBIN A1C  11/18/2019  . COVID-19 Vaccine  Completed  . PNA vac Low Risk Adult  Completed    Health Maintenance  Health Maintenance Due  Topic Date Due  . TETANUS/TDAP  Never done  . DEXA SCAN  Never done  . OPHTHALMOLOGY EXAM  01/24/2018  . FOOT EXAM  06/01/2018    Colorectal cancer screening: No longer required.  Mammogram status: No longer required.  Bone Density status: Completed per patient  Lung Cancer Screening: (Low Dose CT Chest recommended if Age 39-80 years, 30 pack-year currently smoking OR have quit w/in 15years.) does not qualify.   Lung Cancer Screening Referral: no  Additional Screening:  Hepatitis C Screening: does not qualify;   Vision Screening: Recommended annual ophthalmology exams for early detection of glaucoma and other disorders of the eye. Is the patient up to date with their annual eye exam?  No  Who is the provider or what  is the name of the office in which the patient attends annual eye exams? [facility] If pt is not established with a provider, would they like to be referred to a provider to establish care? No .   Dental Screening: Recommended annual dental exams for proper oral hygiene  Community Resource Referral / Chronic Care Management: CRR  required this visit?  No   CCM required this visit?  No      Plan:     I have personally reviewed and noted the following in the patient's chart:   . Medical and social history . Use of alcohol, tobacco or illicit drugs  . Current medications and supplements . Functional ability and status . Nutritional status . Physical activity . Advanced directives . List of other physicians . Hospitalizations, surgeries, and ER visits in previous 12 months . Vitals . Screenings to include cognitive, depression, and falls . Referrals and appointments  In addition, I have reviewed and discussed with patient certain preventive protocols, quality metrics, and best practice recommendations. A written personalized care plan for preventive services as well as general preventive health recommendations were provided to patient.   Due to this being a telephonic visit, the after visit summary with patients personalized plan was offered to patient via mail  Patient preferred to pick up at office at next visit.   Barb Merinoickeah E Frieda Arnall, LPN   4/0/98117/08/2019   Nurse Notes: Due for DEXA. States that she has had one in the past. States she will follow up for eye exams

## 2019-10-12 ENCOUNTER — Other Ambulatory Visit: Payer: Self-pay | Admitting: Family Medicine

## 2019-10-12 ENCOUNTER — Other Ambulatory Visit: Payer: Self-pay | Admitting: Nurse Practitioner

## 2019-10-12 DIAGNOSIS — I10 Essential (primary) hypertension: Secondary | ICD-10-CM

## 2019-10-12 DIAGNOSIS — E782 Mixed hyperlipidemia: Secondary | ICD-10-CM

## 2019-10-12 NOTE — Telephone Encounter (Signed)
Requested Prescriptions  Pending Prescriptions Disp Refills   losartan (COZAAR) 50 MG tablet [Pharmacy Med Name: Losartan Potassium 50 MG Oral Tablet] 270 tablet 0    Sig: TAKE 2 TABLETS BY MOUTH IN THE MORNING AND 1 IN THE EVENING     Cardiovascular:  Angiotensin Receptor Blockers Failed - 10/12/2019 11:24 AM      Failed - Cr in normal range and within 180 days    Creat  Date Value Ref Range Status  10/14/2018 0.86 0.60 - 0.88 mg/dL Final    Comment:    For patients >80 years of age, the reference limit for Creatinine is approximately 13% higher for people identified as African-American. .          Failed - K in normal range and within 180 days    Potassium  Date Value Ref Range Status  10/14/2018 4.2 3.5 - 5.3 mmol/L Final  04/09/2013 3.8 3.5 - 5.1 mmol/L Final         Failed - Last BP in normal range    BP Readings from Last 1 Encounters:  05/21/19 (!) 156/46         Passed - Patient is not pregnant      Passed - Valid encounter within last 6 months    Recent Outpatient Visits          4 months ago Type 2 diabetes mellitus with hyperglycemia, without long-term current use of insulin (HCC)   [facility], Jodelle Gross, FNP   10 months ago Chronic midline low back pain with left-sided sciatica   [facility] Galen Manila, NP   12 months ago Type 2 diabetes mellitus with hyperglycemia, without long-term current use of insulin [facility])   [facility], Alison Stalling, NP   1 year ago Type 2 diabetes mellitus with hyperglycemia, without long-term current use of insulin [facility])   [facility], Alison Stalling, NP   1 year ago Type 2 diabetes mellitus without complication, without long-term current use of insulin [facility])   [facility] Kyung Rudd, Alison Stalling, NP      Future Appointments            In 1 month Malfi, Jodelle Gross, FNP [facility], [facility]

## 2019-10-12 NOTE — Telephone Encounter (Signed)
Courtesy RF- pt has blood work and an Scientist, research (medical) Prescriptions  Pending Prescriptions Disp Refills   simvastatin (ZOCOR) 20 MG tablet [Pharmacy Med Name: Simvastatin 20 MG Oral Tablet] 90 tablet 0    Sig: TAKE 1 TABLET BY MOUTH AT BEDTIME     Cardiovascular:  Antilipid - Statins Failed - 10/12/2019 11:24 AM      Failed - Total Cholesterol in normal range and within 360 days    Cholesterol  Date Value Ref Range Status  10/14/2018 143 <200 mg/dL Final         Failed - LDL in normal range and within 360 days    LDL Cholesterol (Calc)  Date Value Ref Range Status  10/14/2018 67 mg/dL (calc) Final    Comment:    Reference range: <100 . Desirable range <100 mg/dL for primary prevention;   <70 mg/dL for patients with CHD or diabetic patients  with > or = 2 CHD risk factors. Marland Kitchen LDL-C is now calculated using the Martin-Hopkins  calculation, which is a validated novel method providing  better accuracy than the Friedewald equation in the  estimation of LDL-C.  Horald Pollen et al. Lenox Ahr. 4656;812(75): 2061-2068  (http://education.[employer].com/faq/FAQ164)          Failed - HDL in normal range and within 360 days    HDL  Date Value Ref Range Status  10/14/2018 61 > OR = 50 mg/dL Final         Failed - Triglycerides in normal range and within 360 days    Triglycerides  Date Value Ref Range Status  10/14/2018 66 <150 mg/dL Final         Passed - Patient is not pregnant      Passed - Valid encounter within last 12 months    Recent Outpatient Visits          4 months ago Type 2 diabetes mellitus with hyperglycemia, without long-term current use of insulin (HCC)   [facility], Jodelle Gross, FNP   10 months ago Chronic midline low back pain with left-sided sciatica   [facility] Galen Manila, NP   12 months ago Type 2 diabetes mellitus with hyperglycemia, without long-term current use of insulin [facility])   [facility]  Kyung Rudd, Alison Stalling, NP   1 year ago Type 2 diabetes mellitus with hyperglycemia, without long-term current use of insulin [facility])   [facility] Kyung Rudd, Alison Stalling, NP   1 year ago Type 2 diabetes mellitus without complication, without long-term current use of insulin [facility])   [facility] Kyung Rudd, Alison Stalling, NP      Future Appointments            In 1 month Malfi, Jodelle Gross, FNP [facility], [facility]

## 2019-11-11 ENCOUNTER — Other Ambulatory Visit: Payer: Self-pay | Admitting: Nurse Practitioner

## 2019-11-11 DIAGNOSIS — E119 Type 2 diabetes mellitus without complications: Secondary | ICD-10-CM

## 2019-11-18 ENCOUNTER — Encounter: Payer: Self-pay | Admitting: Family Medicine

## 2019-11-18 ENCOUNTER — Other Ambulatory Visit: Payer: Self-pay

## 2019-11-18 ENCOUNTER — Ambulatory Visit (INDEPENDENT_AMBULATORY_CARE_PROVIDER_SITE_OTHER): Payer: PPO | Admitting: Family Medicine

## 2019-11-18 VITALS — BP 115/45 | HR 67 | Temp 97.8°F | Resp 18 | Ht 64.0 in | Wt 177.0 lb

## 2019-11-18 DIAGNOSIS — R42 Dizziness and giddiness: Secondary | ICD-10-CM | POA: Diagnosis not present

## 2019-11-18 DIAGNOSIS — E782 Mixed hyperlipidemia: Secondary | ICD-10-CM

## 2019-11-18 DIAGNOSIS — E1165 Type 2 diabetes mellitus with hyperglycemia: Secondary | ICD-10-CM

## 2019-11-18 DIAGNOSIS — R635 Abnormal weight gain: Secondary | ICD-10-CM | POA: Diagnosis not present

## 2019-11-18 DIAGNOSIS — K219 Gastro-esophageal reflux disease without esophagitis: Secondary | ICD-10-CM | POA: Diagnosis not present

## 2019-11-18 DIAGNOSIS — I10 Essential (primary) hypertension: Secondary | ICD-10-CM | POA: Diagnosis not present

## 2019-11-18 LAB — POCT GLYCOSYLATED HEMOGLOBIN (HGB A1C): Hemoglobin A1C: 6.2 % — AB (ref 4.0–5.6)

## 2019-11-18 NOTE — Assessment & Plan Note (Signed)
Likely over-controlled hypertension.  BP with diastolic 39-47 with patient concerns of dizziness with positional changes x 1 day.   Complications: Obesity, GERD, T2DM, Hyperlipidemia  Plan: 1. Continue taking labetalol 100mg  3x per day, losartan 100mg  in AM and 50mg  in PM.  STOP amlodipine 10mg  daily 2. Obtain labs today  3. Encouraged heart healthy diet and increasing exercise to 30 minutes most days of the week, going no more than 2 days in a row without exercise. 4. Check BP 1-2 x per day at home, keep log, and bring to clinic at next appointment. 5. Follow up 2 weeks for blood pressure re-evaluation

## 2019-11-18 NOTE — Assessment & Plan Note (Signed)
Stable and well controlled.  Currently taking simvastatin 20mg  daily and tolerating it well.  Reviewed common side effects of myalgia (reversible off med), less common side effects of cognitive impairment (reversible off med), increased glucose, rhabdomyolysis.  Plan: 1) Labs to be drawn today 2) Continue simvastatin 20mg  daily  3) Heart healthy diet and to exercise every other day for 30 minutes per day, going no more than 2 days in a row without exercise. 4) We will see you back in 6 months

## 2019-11-18 NOTE — Assessment & Plan Note (Signed)
Currently well controlled on pantoprazole 40mg once daily.  Plan: 1. Continue pantoprazole 40mg once daily. Side effects discussed. Pt wants to continue med. 2. Avoid diet triggers. Reviewed need to seek care if globus sensation, difficulty swallowing, s/sx of GI bleed. 3. Follow up as needed and in 6 months. 

## 2019-11-18 NOTE — Patient Instructions (Addendum)
STOP the amlodipine 10mg  daily.  It is possible that you are overmedicated with your hypertension.  Take your blood pressure 2x per day for the next 2 weeks.  If you continue to have dizziness or blood pressures with readings under 90 SBP or 50 DBP to contact our office sooner to discuss.  Continue all other medications as prescribed  You can learn more information online about your diabetes at American Diabetes Association: http://www.diabetes.org/ - General self-care (diet, medications, blood sugar checks). - Diet recommendations - There are even recipes available for you to look at and try.  We will plan to see you back in 2 weeks for hypertension re-evaluation  You will receive a survey after today's visit either digitally by e-mail or paper by USPS mail. Your experiences and feedback matter to .  Please respond so we know how we are doing as we provide care for you.  Call us with any questions/concerns/needs.  It is my goal to be available to you for your health concerns.  Thanks for choosing me to be a partner in your healthcare needs!  [place], FNP-C Family Nurse Practitioner [facility] Phone: [phone]

## 2019-11-18 NOTE — Assessment & Plan Note (Signed)
Well-controlledDM with A1c 6.2% improved from 6.4% and goal A1c < 7.0%. - No known complications or hypoglycemia.  Plan:  1. Continue current therapy: metformin 500mg  BID WC 2. Encourage improved lifestyle: - low carb/low glycemic diet reinforced prior education - Increase physical activity to 30 minutes most days of the week.  Explained that increased physical activity increases body's use of sugar for energy. 3. Check fasting am CBG and log these.  Bring log to next visit for review 4. Continue ARB and Statin 5. Diabetic foot exam completed and WNL - Advised to schedule DM ophtho exam, send record. 6. Follow-up 6 months

## 2019-11-18 NOTE — Assessment & Plan Note (Signed)
Likely related to being overmedicated with her hypertension.  Will remove amlodipine from her medication regimen and have her take her blood pressure readings 1-2x per day for the next 2 weeks to re-evaluate.  See HTN A/P

## 2019-11-18 NOTE — Progress Notes (Signed)
Subjective:    Patient ID: Margaret Mcintosh, female    DOB: May 27, 1932, 86 y.o.   MRN: [id number]  Margaret Mcintosh is a 86 y.o. female presenting on 11/18/2019 for Diabetes and Hypertension (report dizziness with sudden movement. x lastnight)   HPI  Hypertension - She is checking BP at home or outside of clinic.    - Current medications: amlodipine 10mg  daily, losartan 100mg  in AM and 50mg  in PM, labetalol 100mg  TID, tolerating with side effects of dizziness x 1 day - She is symptomatic with dizziness x 1 day. - Pt denies headache, lightheadedness, changes in vision, chest tightness/pressure, palpitations, leg swelling, sudden loss of speech or loss of consciousness. - She  reports no regular exercise routine. - Her diet is moderate in salt, moderate in fat, and moderate in carbohydrates.  Diabetes Pt presents today for follow up Type 2 Diabetes Mellitus.  He/she (caps): She ACTION; IS/IS NOT: is checking AM CBG at home. -Current diabetic medications include: metformin 500mg  twice daily with meals -ACTION; IS/IS NOT: is not currently symptomatic -Actions; denies/reports/admits to: denies polydipsia, polyphagia, polyuria, headaches, diaphoresis, shakiness, chills, pain, numbness or tingling in extremities or changes in vision -Clinical course has been improving  -Reports no exercise routine -Diet is moderate in salt, moderate in fat, and moderate in carbohydrates  PREVENTION Eye exam current (within 1 year) Has visit scheduled Foot exam current (within 1 year) Up to date Lipid/ASCVD risk reduction - on statin: YES/NO: Yes  Kidney Protection (On ACE/ARB)? YES/NO: Yes   Depression screen [facility] 2/9 09/30/2019 05/21/2019 09/28/2017  Decreased Interest 0 0 0  Down, Depressed, Hopeless 0 0 0  PHQ - 2 Score 0 0 0  Altered sleeping - - 0  Tired, decreased energy - - 0  Change in appetite - - 0  Feeling bad or failure about yourself  - - 0  Trouble concentrating - - 0  Moving slowly or  fidgety/restless - - 0  Suicidal thoughts - - 0  PHQ-9 Score - - 0  Difficult doing work/chores - - -    Social History   Tobacco Use  . Smoking status: Never Smoker  . Smokeless tobacco: Never Used  Vaping Use  . Vaping Use: Never used  Substance Use Topics  . Alcohol use: No  . Drug use: No    Review of Systems  Constitutional: Negative.   HENT: Negative.   Eyes: Negative.   Respiratory: Negative.   Cardiovascular: Negative.   Gastrointestinal: Negative.   Endocrine: Negative.   Genitourinary: Negative.   Musculoskeletal: Negative.   Skin: Negative.   Allergic/Immunologic: Negative.   Neurological: Positive for dizziness. Negative for tremors, seizures, syncope, facial asymmetry, speech difficulty, weakness, light-headedness, numbness and headaches.  Hematological: Negative.   Psychiatric/Behavioral: Negative.    Per HPI unless specifically indicated above     Objective:    BP (!) 115/45 (BP Location: Right Arm, Patient Position: Standing, Cuff Size: Normal)   Pulse 67   Temp 97.8 F (36.6 C) (Oral)   Resp 18   Ht 5\' 4"  (1.626 m)   Wt 177 lb (80.3 kg)   SpO2 97%   BMI 30.38 kg/m   Wt Readings from Last 3 Encounters:  11/18/19 177 lb (80.3 kg)  09/30/19 175 lb (79.4 kg)  05/21/19 178 lb 12.8 oz (81.1 kg)    Physical Exam Vitals reviewed.  Constitutional:      General: She is not in acute distress.    Appearance: Normal  appearance. She is well-developed and well-groomed. She is obese. She is not ill-appearing or toxic-appearing.  HENT:     Head: Normocephalic and atraumatic.     Nose:     Comments: Lesia Sago is in place, covering mouth and nose. Eyes:     General: Lids are normal. Vision grossly intact.        Right eye: No discharge.        Left eye: No discharge.     Extraocular Movements: Extraocular movements intact.     Conjunctiva/sclera: Conjunctivae normal.     Pupils: Pupils are equal, round, and reactive to light.  Cardiovascular:      Rate and Rhythm: Normal rate and regular rhythm.     Pulses: Normal pulses.          Dorsalis pedis pulses are 2+ on the right side and 2+ on the left side.     Heart sounds: Normal heart sounds. No murmur heard.  No friction rub. No gallop.   Pulmonary:     Effort: Pulmonary effort is normal. No respiratory distress.     Breath sounds: Normal breath sounds.  Musculoskeletal:     Right lower leg: No edema.     Left lower leg: No edema.  Feet:     Comments: Absent of callus and all other lesions.  Some dry skin noted, but pt reports regular foot care and can see bottom of her feet.  Skin:    General: Skin is warm and dry.     Capillary Refill: Capillary refill takes less than 2 seconds.  Neurological:     General: No focal deficit present.     Mental Status: She is alert and oriented to person, place, and time.     Cranial Nerves: No cranial nerve deficit.     Sensory: Sensation is intact.     Motor: No weakness.     Coordination: Coordination normal.     Gait: Gait normal.     Deep Tendon Reflexes: Reflexes normal.  Psychiatric:        Attention and Perception: Attention and perception normal.        Mood and Affect: Mood and affect normal.        Speech: Speech normal.        Behavior: Behavior normal. Behavior is cooperative.        Thought Content: Thought content normal.        Cognition and Memory: Cognition and memory normal.        Judgment: Judgment normal.    Results for orders placed or performed in visit on 11/18/19  POCT glycosylated hemoglobin (Hb A1C)  Result Value Ref Range   Hemoglobin A1C 6.2 (A) 4.0 - 5.6 %   HbA1c POC (<> result, manual entry)     HbA1c, POC (prediabetic range)     HbA1c, POC (controlled diabetic range)        Assessment & Plan:   Problem List Items Addressed This Visit      Cardiovascular and Mediastinum   Essential hypertension    Likely over-controlled hypertension.  BP with diastolic 39-47 with patient concerns of dizziness  with positional changes x 1 day.   Complications: Obesity, GERD, T2DM, Hyperlipidemia  Plan: 1. Continue taking labetalol 100mg  3x per day, losartan 100mg  in AM and 50mg  in PM.  STOP amlodipine 10mg  daily 2. Obtain labs today  3. Encouraged heart healthy diet and increasing exercise to 30 minutes most days of the week, going no more  than 2 days in a row without exercise. 4. Check BP 1-2 x per day at home, keep log, and bring to clinic at next appointment. 5. Follow up 2 weeks for blood pressure re-evaluation        Relevant Orders   CBC with Differential   COMPLETE METABOLIC PANEL WITH GFR     Digestive   GERD (gastroesophageal reflux disease)    Currently well controlled on pantoprazole 40mg  once daily.  Plan: 1. Continue pantoprazole 40mg  once daily. Side effects discussed. Pt wants to continue med. 2. Avoid diet triggers. Reviewed need to seek care if globus sensation, difficulty swallowing, s/sx of GI bleed. 3. Follow up as needed and in 6 months.         Endocrine   Diabetes (HCC) - Primary    Well-controlledDM with A1c 6.2% improved from 6.4% and goal A1c < 7.0%. - No known complications or hypoglycemia.  Plan:  1. Continue current therapy: metformin 500mg  BID WC 2. Encourage improved lifestyle: - low carb/low glycemic diet reinforced prior education - Increase physical activity to 30 minutes most days of the week.  Explained that increased physical activity increases body's use of sugar for energy. 3. Check fasting am CBG and log these.  Bring log to next visit for review 4. Continue ARB and Statin 5. Diabetic foot exam completed and WNL - Advised to schedule DM ophtho exam, send record. 6. Follow-up 6 months        Relevant Orders   POCT glycosylated hemoglobin (Hb A1C) (Completed)   CBC with Differential   COMPLETE METABOLIC PANEL WITH GFR   Ambulatory referral to Podiatry     Other   Hyperlipemia    Stable and well controlled.  Currently taking  simvastatin 20mg  daily and tolerating it well.  Reviewed common side effects of myalgia (reversible off med), less common side effects of cognitive impairment (reversible off med), increased glucose, rhabdomyolysis.  Plan: 1) Labs to be drawn today 2) Continue simvastatin 20mg  daily  3) Heart healthy diet and to exercise every other day for 30 minutes per day, going no more than 2 days in a row without exercise. 4) We will see you back in 6 months       Relevant Orders   Lipid Profile   Dizziness    Likely related to being overmedicated with her hypertension.  Will remove amlodipine from her medication regimen and have her take her blood pressure readings 1-2x per day for the next 2 weeks to re-evaluate.  See HTN A/P      Relevant Orders   Orthostatic vital signs    Other Visit Diagnoses    Weight gain       Relevant Orders   Thyroid Panel With TSH      No orders of the defined types were placed in this encounter.     Follow up plan: Return in about 2 weeks (around 12/02/2019) for HTN F/U.   , FNP Family Nurse Practitioner [facility] 11/18/2019, 12:07 PM

## 2019-11-19 LAB — COMPLETE METABOLIC PANEL WITH GFR
AG Ratio: 1.6 (calc) (ref 1.0–2.5)
ALT: 10 U/L (ref 6–29)
AST: 14 U/L (ref 10–35)
Albumin: 4.4 g/dL (ref 3.6–5.1)
Alkaline phosphatase (APISO): 55 U/L (ref 37–153)
BUN/Creatinine Ratio: 20 (calc) (ref 6–22)
BUN: 18 mg/dL (ref 7–25)
CO2: 27 mmol/L (ref 20–32)
Calcium: 9.8 mg/dL (ref 8.6–10.4)
Chloride: 104 mmol/L (ref 98–110)
Creat: 0.91 mg/dL — ABNORMAL HIGH (ref 0.60–0.88)
GFR, Est African American: 67 mL/min/{1.73_m2} (ref >=60)
GFR, Est Non African American: 58 mL/min/{1.73_m2} — ABNORMAL LOW (ref >=60)
Globulin: 2.7 g/dL (calc) (ref 1.9–3.7)
Glucose, Bld: 134 mg/dL — ABNORMAL HIGH (ref 65–99)
Potassium: 4.7 mmol/L (ref 3.5–5.3)
Sodium: 140 mmol/L (ref 135–146)
Total Bilirubin: 0.7 mg/dL (ref 0.2–1.2)
Total Protein: 7.1 g/dL (ref 6.1–8.1)

## 2019-11-19 LAB — CBC WITH DIFFERENTIAL/PLATELET
Absolute Monocytes: 399 cells/uL (ref 200–950)
Basophils Absolute: 29 cells/uL (ref 0–200)
Basophils Relative: 0.7 %
Eosinophils Absolute: 50 cells/uL (ref 15–500)
Eosinophils Relative: 1.2 %
HCT: 32.5 % — ABNORMAL LOW (ref 35.0–45.0)
Hemoglobin: 10.1 g/dL — ABNORMAL LOW (ref 11.7–15.5)
Lymphs Abs: 752 cells/uL — ABNORMAL LOW (ref 850–3900)
MCH: 26.2 pg — ABNORMAL LOW (ref 27.0–33.0)
MCHC: 31.1 g/dL — ABNORMAL LOW (ref 32.0–36.0)
MCV: 84.4 fL (ref 80.0–100.0)
MPV: 12.3 fL (ref 7.5–12.5)
Monocytes Relative: 9.5 %
Neutro Abs: 2969 cells/uL (ref 1500–7800)
Neutrophils Relative %: 70.7 %
Platelets: 175 10*3/uL (ref 140–400)
RBC: 3.85 10*6/uL (ref 3.80–5.10)
RDW: 14 % (ref 11.0–15.0)
Total Lymphocyte: 17.9 %
WBC: 4.2 10*3/uL (ref 3.8–10.8)

## 2019-11-19 LAB — THYROID PANEL WITH TSH
Free Thyroxine Index: 2.8 (ref 1.4–3.8)
T3 Uptake: 32 % (ref 22–35)
T4, Total: 8.6 ug/dL (ref 5.1–11.9)
TSH: 1.41 mIU/L (ref 0.40–4.50)

## 2019-11-19 LAB — LIPID PANEL
Cholesterol: 121 mg/dL (ref <200)
HDL: 56 mg/dL (ref >=50)
LDL Cholesterol (Calc): 50 mg/dL (calc)
Non-HDL Cholesterol (Calc): 65 mg/dL (calc) (ref <130)
Total CHOL/HDL Ratio: 2.2 (calc) (ref <5.0)
Triglycerides: 66 mg/dL (ref <150)

## 2019-12-04 ENCOUNTER — Encounter: Payer: Self-pay | Admitting: Family Medicine

## 2019-12-04 ENCOUNTER — Other Ambulatory Visit: Payer: Self-pay

## 2019-12-04 ENCOUNTER — Ambulatory Visit (INDEPENDENT_AMBULATORY_CARE_PROVIDER_SITE_OTHER): Payer: PPO | Admitting: Family Medicine

## 2019-12-04 VITALS — BP 160/56 | HR 63 | Temp 98.4°F | Resp 18 | Ht 64.0 in | Wt 177.0 lb

## 2019-12-04 DIAGNOSIS — I10 Essential (primary) hypertension: Secondary | ICD-10-CM

## 2019-12-04 DIAGNOSIS — Z23 Encounter for immunization: Secondary | ICD-10-CM | POA: Diagnosis not present

## 2019-12-04 MED ORDER — AMLODIPINE BESYLATE 5 MG PO TABS: 5.0000 mg | ORAL_TABLET | Freq: Every day | ORAL | 3 refills | Status: DC

## 2019-12-04 NOTE — Assessment & Plan Note (Signed)
Pt is over age 86.  Needs annual influenza vaccine.  VIS provided.  Plan: 1. Administer high dose fluzone today.

## 2019-12-04 NOTE — Patient Instructions (Signed)
I have sent in a prescription for Amlodipine 5mg  to resume taking daily.  Continue all other medications as prescribed.  We will plan to see you back in 2 weeks for hypertension re-evaluation  You will receive a survey after today's visit either digitally by e-mail or paper by USPS mail. Your experiences and feedback matter to .  Please respond so we know how we are doing as we provide care for you.  Call us with any questions/concerns/needs.  It is my goal to be available to you for your health concerns.  Thanks for choosing me to be a partner in your healthcare needs!  [place], FNP-C Family Nurse Practitioner [facility] Phone: [phone]

## 2019-12-04 NOTE — Progress Notes (Signed)
Subjective:    Patient ID: Margaret Mcintosh, female    DOB: 02/19/1933, 86 y.o.   MRN: [id number]  Margaret Mcintosh is a 86 y.o. female presenting on 12/04/2019 for Hypertension   HPI   Ms. Six presents to clinic for follow up visit on her hypertension.  Reports since she has stopped her amlodipine 10mg  2 weeks ago her dizziness has resolved.  Hypertension - She is checking BP at home or outside of clinic.  Readings 150-160/60-80 - Current medications: labetolol 100mg  TID, losartan 50mg  BID, tolerating well without side effects - She is not currently symptomatic. - Pt denies headache, lightheadedness, dizziness, changes in vision, chest tightness/pressure, palpitations, leg swelling, sudden loss of speech or loss of consciousness. - She  reports no regular exercise routine. - Her diet is high in salt, high in fat, and high in carbohydrates.  Depression screen [facility] 2/9 09/30/2019 05/21/2019 09/28/2017  Decreased Interest 0 0 0  Down, Depressed, Hopeless 0 0 0  PHQ - 2 Score 0 0 0  Altered sleeping - - 0  Tired, decreased energy - - 0  Change in appetite - - 0  Feeling bad or failure about yourself  - - 0  Trouble concentrating - - 0  Moving slowly or fidgety/restless - - 0  Suicidal thoughts - - 0  PHQ-9 Score - - 0  Difficult doing work/chores - - -    Social History   Tobacco Use  . Smoking status: Never Smoker  . Smokeless tobacco: Never Used  Vaping Use  . Vaping Use: Never used  Substance Use Topics  . Alcohol use: No  . Drug use: No    Review of Systems  Constitutional: Negative.   HENT: Negative.   Eyes: Negative.   Respiratory: Negative.   Cardiovascular: Negative.   Gastrointestinal: Negative.   Endocrine: Negative.   Genitourinary: Negative.   Musculoskeletal: Negative.   Skin: Negative.   Allergic/Immunologic: Negative.   Neurological: Negative.   Hematological: Negative.   Psychiatric/Behavioral: Negative.    Per HPI unless specifically  indicated above     Objective:    BP (!) 160/56 (BP Location: Left Arm, Patient Position: Sitting, Cuff Size: Normal)   Pulse 63   Temp 98.4 F (36.9 C) (Oral)   Resp 18   Ht 5\' 4"  (1.626 m)   Wt 177 lb (80.3 kg)   SpO2 99%   BMI 30.38 kg/m   Wt Readings from Last 3 Encounters:  12/04/19 177 lb (80.3 kg)  11/18/19 177 lb (80.3 kg)  09/30/19 175 lb (79.4 kg)    Physical Exam Vitals reviewed.  Constitutional:      General: She is not in acute distress.    Appearance: Normal appearance. She is well-developed and well-groomed. She is obese. She is not ill-appearing or toxic-appearing.  HENT:     Head: Normocephalic and atraumatic.     Nose:     Comments: is in place, covering mouth and nose. Eyes:     General: Lids are normal. Vision grossly intact.        Right eye: No discharge.        Left eye: No discharge.     Extraocular Movements: Extraocular movements intact.     Conjunctiva/sclera: Conjunctivae normal.     Pupils: Pupils are equal, round, and reactive to light.  Cardiovascular:     Rate and Rhythm: Normal rate and regular rhythm.     Pulses: Normal pulses.  Dorsalis pedis pulses are 2+ on the right side and 2+ on the left side.     Heart sounds: Normal heart sounds. No murmur heard.  No friction rub. No gallop.   Pulmonary:     Effort: Pulmonary effort is normal. No respiratory distress.     Breath sounds: Normal breath sounds.  Musculoskeletal:     Right lower leg: No edema.     Left lower leg: No edema.  Skin:    General: Skin is warm and dry.     Capillary Refill: Capillary refill takes less than 2 seconds.  Neurological:     General: No focal deficit present.     Mental Status: She is alert and oriented to person, place, and time.  Psychiatric:        Attention and Perception: Attention and perception normal.        Mood and Affect: Mood and affect normal.        Speech: Speech normal.        Behavior: Behavior normal. Behavior is  cooperative.        Thought Content: Thought content normal.        Cognition and Memory: Cognition and memory normal.        Judgment: Judgment normal.    Results for orders placed or performed in visit on 11/18/19  CBC with Differential  Result Value Ref Range   WBC 4.2 3.8 - 10.8 Thousand/uL   RBC 3.85 3.80 - 5.10 Million/uL   Hemoglobin 10.1 (L) 11.7 - 15.5 g/dL   HCT 84.1 (L) 35 - 45 %   MCV 84.4 80.0 - 100.0 fL   MCH 26.2 (L) 27.0 - 33.0 pg   MCHC 31.1 (L) 32.0 - 36.0 g/dL   RDW 66.0 63.0 - 16.0 %   Platelets 175 140 - 400 Thousand/uL   MPV 12.3 7.5 - 12.5 fL   Neutro Abs 2,969 1,500 - 7,800 cells/uL   Lymphs Abs 752 (L) 850 - 3,900 cells/uL   Absolute Monocytes 399 200 - 950 cells/uL   Eosinophils Absolute 50 15 - 500 cells/uL   Basophils Absolute 29 0 - 200 cells/uL   Neutrophils Relative % 70.7 %   Total Lymphocyte 17.9 %   Monocytes Relative 9.5 %   Eosinophils Relative 1.2 %   Basophils Relative 0.7 %  COMPLETE METABOLIC PANEL WITH GFR  Result Value Ref Range   Glucose, Bld 134 (H) 65 - 99 mg/dL   BUN 18 7 - 25 mg/dL   Creat 1.09 (H) 3.23 - 0.88 mg/dL   GFR, Est Non African American 58 (L) > OR = 60 mL/min/1.32m2   GFR, Est African American 67 > OR = 60 mL/min/1.58m2   BUN/Creatinine Ratio 20 6 - 22 (calc)   Sodium 140 135 - 146 mmol/L   Potassium 4.7 3.5 - 5.3 mmol/L   Chloride 104 98 - 110 mmol/L   CO2 27 20 - 32 mmol/L   Calcium 9.8 8.6 - 10.4 mg/dL   Total Protein 7.1 6.1 - 8.1 g/dL   Albumin 4.4 3.6 - 5.1 g/dL   Globulin 2.7 1.9 - 3.7 g/dL (calc)   AG Ratio 1.6 1.0 - 2.5 (calc)   Total Bilirubin 0.7 0.2 - 1.2 mg/dL   Alkaline phosphatase (APISO) 55 37 - 153 U/L   AST 14 10 - 35 U/L   ALT 10 6 - 29 U/L  Lipid Profile  Result Value Ref Range   Cholesterol 121 <200 mg/dL  HDL 56 > OR = 50 mg/dL   Triglycerides 66 <045 mg/dL   LDL Cholesterol (Calc) 50 mg/dL (calc)   Total CHOL/HDL Ratio 2.2 <5.0 (calc)   Non-HDL Cholesterol (Calc) 65 <409 mg/dL  (calc)  Thyroid Panel With TSH  Result Value Ref Range   T3 Uptake 32 22 - 35 %   T4, Total 8.6 5.1 - 11.9 mcg/dL   Free Thyroxine Index 2.8 1.4 - 3.8   TSH 1.41 0.40 - 4.50 mIU/L  POCT glycosylated hemoglobin (Hb A1C)  Result Value Ref Range   Hemoglobin A1C 6.2 (A) 4.0 - 5.6 %   HbA1c POC (<> result, manual entry)     HbA1c, POC (prediabetic range)     HbA1c, POC (controlled diabetic range)        Assessment & Plan:   Problem List Items Addressed This Visit      Cardiovascular and Mediastinum   Essential hypertension - Primary   Relevant Medications   amLODipine (NORVASC) 5 MG tablet     Other   Needs flu shot    Pt is over age 68.  Needs annual influenza vaccine.  VIS provided.  Plan: 1. Administer high dose fluzone today.       Relevant Orders   Flu Vaccine QUAD High Dose(Fluad) (Completed)      Meds ordered this encounter  Medications  . amLODipine (NORVASC) 5 MG tablet    Sig: Take 1 tablet (5 mg total) by mouth daily.    Dispense:  90 tablet    Refill:  3    Follow up plan: Return in about 2 weeks (around 12/18/2019) for HTN F/U (Can be virtual).   Charlaine Dalton, FNP Family Nurse Practitioner [facility] 12/04/2019, 11:34 AM

## 2019-12-08 ENCOUNTER — Encounter: Payer: Self-pay | Admitting: Podiatry

## 2019-12-08 ENCOUNTER — Ambulatory Visit: Payer: PPO | Admitting: Podiatry

## 2019-12-08 ENCOUNTER — Other Ambulatory Visit: Payer: Self-pay

## 2019-12-08 DIAGNOSIS — E1142 Type 2 diabetes mellitus with diabetic polyneuropathy: Secondary | ICD-10-CM

## 2019-12-08 DIAGNOSIS — E1140 Type 2 diabetes mellitus with diabetic neuropathy, unspecified: Secondary | ICD-10-CM | POA: Insufficient documentation

## 2019-12-08 NOTE — Progress Notes (Signed)
This patient presents to the office for an evaluation of herdiabetic feet.  This patient  says there  is  no pain and discomfort in their feet.   She does experience coldness through her feet especially at night.    She says she walks when her feet get cold.  This patient presents for a diabetic foot exam.  She is diabetic for which she takes gabapentin.  She also has history of foot cramps for which she takes mustard.  General Appearance  Alert, conversant and in no acute stress.  Vascular  Dorsalis pedis and posterior tibial  pulses are palpable  bilaterally.  Capillary return is within normal limits  bilaterally. Temperature is within normal limits  bilaterally.  Neurologic  Senn-Weinstein monofilament wire test within normal limits  bilaterally. Muscle power within normal limits bilaterally.  Nails Normal nails noted with. no evidence of bacterial infection or drainage bilaterally.  Orthopedic  No limitations of motion of motion feet .  No crepitus or effusions noted.  HAV  B/L.  Skin  normotropic skin with no porokeratosis noted bilaterally.  No signs of infections or ulcers noted.     Onychomycosis  Diabetes with no foot complications  IE  Debride nails x 10.  A diabetic foot exam was performed and there is no evidence of any vascular or neurologic pathology. Told this patient to check with her PCP about gabapentin .   RTC 1 year.   Helane Gunther DPM

## 2019-12-10 DIAGNOSIS — H26492 Other secondary cataract, left eye: Secondary | ICD-10-CM | POA: Diagnosis not present

## 2019-12-10 LAB — HM DIABETES EYE EXAM

## 2019-12-18 ENCOUNTER — Other Ambulatory Visit: Payer: Self-pay

## 2019-12-18 ENCOUNTER — Telehealth (INDEPENDENT_AMBULATORY_CARE_PROVIDER_SITE_OTHER): Payer: PPO | Admitting: Family Medicine

## 2019-12-18 ENCOUNTER — Encounter: Payer: Self-pay | Admitting: Family Medicine

## 2019-12-18 VITALS — BP 128/62 | HR 61

## 2019-12-18 DIAGNOSIS — I10 Essential (primary) hypertension: Secondary | ICD-10-CM | POA: Diagnosis not present

## 2019-12-18 NOTE — Progress Notes (Signed)
Virtual Visit via Telephone  The purpose of this virtual visit is to provide medical care while limiting exposure to the novel coronavirus (COVID19) for both patient and office staff.  Consent was obtained for phone visit:  Yes.   Answered questions that patient had about telehealth interaction:  Yes.   I discussed the limitations, risks, security and privacy concerns of performing an evaluation and management service by telephone. I also discussed with the patient that there may be a patient responsible charge related to this service. The patient expressed understanding and agreed to proceed.  Patient is at home and is accessed via telephone Services are provided by Charlaine Dalton, FNP-C from [facility])  ---------------------------------------------------------------------- Chief Complaint  Patient presents with  . Hypertension    Tuesday 134/64, 69, 11:53pm 130/63, 66 Wednesday bp 11:53am  138/53, 11:00pm 128/62    S: Reviewed CMA documentation. I have called patient and gathered additional HPI as follows:  Margaret Mcintosh presents for telemedicine virtual visit for review of her blood pressure since medication change at last visit of adding amlodipine 5mg  daily.  Reports that her blood pressure readings have been ranging 128-134 SBP/53-64.  Denies any dizziness, visual changes, SOB, cough, DOE, lower extremity edema.  Patient is currently home Denies any high risk travel to areas of current concern for COVID19. Denies any known or suspected exposure to person with or possibly with COVID19  Past Medical History:  Diagnosis Date  . Arthritis   . Cataract   . Complication of anesthesia    "takes a long time to wake me up" (11/04/2015)  . Diabetes mellitus (HCC)   . GERD (gastroesophageal reflux disease)   . Glaucoma   . Hypertension   . Stroke [facility])    last 8/17. Mild left arm/leg weakness  . TIA (transient ischemic attack) 11/04/2015   Social History    Tobacco Use  . Smoking status: Never Smoker  . Smokeless tobacco: Never Used  Vaping Use  . Vaping Use: Never used  Substance Use Topics  . Alcohol use: No  . Drug use: No    Current Outpatient Medications:  .  acetaminophen (TYLENOL) 325 MG tablet, Take 325-650 mg by mouth every 6 (six) hours as needed for mild pain. , Disp: , Rfl:  .  amLODipine (NORVASC) 5 MG tablet, Take 1 tablet (5 mg total) by mouth daily., Disp: 90 tablet, Rfl: 3 .  Cholecalciferol (VITAMIN D3 PO), Take by mouth., Disp: , Rfl:  .  Elastic Bandages & Supports (MEDICAL COMPRESSION SOCKS) MISC, 2 Devices by Does not apply route daily., Disp: 2 each, Rfl: 0 .  FREESTYLE LITE test strip, 1 each by Other route daily., Disp: 90 each, Rfl: 4 .  gabapentin (NEURONTIN) 100 MG capsule, Take 1 capsule (100 mg total) by mouth at bedtime. INCREASE to 1 capsule in am and at bedtime after 2 weeks if needed., Disp: 60 capsule, Rfl: 1 .  labetalol (NORMODYNE) 100 MG tablet, TAKE 1 TABLET BY MOUTH THREE TIMES DAILY, Disp: 270 tablet, Rfl: 0 .  losartan (COZAAR) 50 MG tablet, TAKE 2 TABLETS BY MOUTH IN THE MORNING AND 1 IN THE EVENING, Disp: 270 tablet, Rfl: 0 .  metFORMIN (GLUCOPHAGE) 500 MG tablet, TAKE 1 TABLET BY MOUTH TWICE DAILY WITH A MEAL, Disp: 180 tablet, Rfl: 0 .  Multiple Vitamin (MULTIVITAMIN) tablet, Take 1 tablet by mouth daily., Disp: , Rfl:  .  Omega-3 Fatty Acids (FISH OIL) 1200 MG CAPS, Take by mouth  3 (three) times daily., Disp: , Rfl:  .  pantoprazole (PROTONIX) 40 MG tablet, TAKE 1 TABLET BY MOUTH ONCE DAILY AT 6AM, Disp: 90 tablet, Rfl: 0 .  simvastatin (ZOCOR) 20 MG tablet, TAKE 1 TABLET BY MOUTH AT BEDTIME, Disp: 90 tablet, Rfl: 0  Depression screen [facility] 2/9 09/30/2019 05/21/2019 09/28/2017  Decreased Interest 0 0 0  Down, Depressed, Hopeless 0 0 0  PHQ - 2 Score 0 0 0  Altered sleeping - - 0  Tired, decreased energy - - 0  Change in appetite - - 0  Feeling bad or failure about yourself  - - 0  Trouble  concentrating - - 0  Moving slowly or fidgety/restless - - 0  Suicidal thoughts - - 0  PHQ-9 Score - - 0  Difficult doing work/chores - - -    No flowsheet data found.  -------------------------------------------------------------------------- O: No physical exam performed due to remote telephone encounter.  Physical Exam: Patient remotely monitored without video.  Verbal communication appropriate.  Cognition normal.   Recent Results (from the past 2160 hour(s))  POCT glycosylated hemoglobin (Hb A1C)     Status: Abnormal   Collection Time: 11/18/19  9:45 AM  Result Value Ref Range   Hemoglobin A1C 6.2 (A) 4.0 - 5.6 %   HbA1c POC (<> result, manual entry)     HbA1c, POC (prediabetic range)     HbA1c, POC (controlled diabetic range)    CBC with Differential     Status: Abnormal   Collection Time: 11/18/19 10:10 AM  Result Value Ref Range   WBC 4.2 3.8 - 10.8 Thousand/uL   RBC 3.85 3.80 - 5.10 Million/uL   Hemoglobin 10.1 (L) 11.7 - 15.5 g/dL   HCT 65.4 (L) 35 - 45 %   MCV 84.4 80.0 - 100.0 fL   MCH 26.2 (L) 27.0 - 33.0 pg   MCHC 31.1 (L) 32.0 - 36.0 g/dL   RDW 65.0 35.4 - 65.6 %   Platelets 175 140 - 400 Thousand/uL   MPV 12.3 7.5 - 12.5 fL   Neutro Abs 2,969 1,500 - 7,800 cells/uL   Lymphs Abs 752 (L) 850 - 3,900 cells/uL   Absolute Monocytes 399 200 - 950 cells/uL   Eosinophils Absolute 50 15 - 500 cells/uL   Basophils Absolute 29 0 - 200 cells/uL   Neutrophils Relative % 70.7 %   Total Lymphocyte 17.9 %   Monocytes Relative 9.5 %   Eosinophils Relative 1.2 %   Basophils Relative 0.7 %  COMPLETE METABOLIC PANEL WITH GFR     Status: Abnormal   Collection Time: 11/18/19 10:10 AM  Result Value Ref Range   Glucose, Bld 134 (H) 65 - 99 mg/dL    Comment: .            Fasting reference interval . For someone without known diabetes, a glucose value >125 mg/dL indicates that they may have diabetes and this should be confirmed with a follow-up test. .    BUN 18 7 -  25 mg/dL   Creat 8.12 (H) 7.51 - 0.88 mg/dL    Comment: For patients >27 years of age, the reference limit for Creatinine is approximately 13% higher for people identified as African-American. .    GFR, Est Non African American 58 (L) > OR = 60 mL/min/1.100m2   GFR, Est African American 67 > OR = 60 mL/min/1.39m2   BUN/Creatinine Ratio 20 6 - 22 (calc)   Sodium 140 135 - 146 mmol/L  Potassium 4.7 3.5 - 5.3 mmol/L   Chloride 104 98 - 110 mmol/L   CO2 27 20 - 32 mmol/L   Calcium 9.8 8.6 - 10.4 mg/dL   Total Protein 7.1 6.1 - 8.1 g/dL   Albumin 4.4 3.6 - 5.1 g/dL   Globulin 2.7 1.9 - 3.7 g/dL (calc)   AG Ratio 1.6 1.0 - 2.5 (calc)   Total Bilirubin 0.7 0.2 - 1.2 mg/dL   Alkaline phosphatase (APISO) 55 37 - 153 U/L   AST 14 10 - 35 U/L   ALT 10 6 - 29 U/L  Lipid Profile     Status: None   Collection Time: 11/18/19 10:10 AM  Result Value Ref Range   Cholesterol 121 <200 mg/dL   HDL 56 > OR = 50 mg/dL   Triglycerides 66 <244 mg/dL   LDL Cholesterol (Calc) 50 mg/dL (calc)    Comment: Reference range: <100 . Desirable range <100 mg/dL for primary prevention;   <70 mg/dL for patients with CHD or diabetic patients  with > or = 2 CHD risk factors. Marland Kitchen LDL-C is now calculated using the Martin-Hopkins  calculation, which is a validated novel method providing  better accuracy than the Friedewald equation in the  estimation of LDL-C.  Horald Pollen et al. Lenox Ahr. 0102;725(36): 2061-2068  (http://education.[employer].com/faq/FAQ164)    Total CHOL/HDL Ratio 2.2 <5.0 (calc)   Non-HDL Cholesterol (Calc) 65 <644 mg/dL (calc)    Comment: For patients with diabetes plus 1 major ASCVD risk  factor, treating to a non-HDL-C goal of <100 mg/dL  (LDL-C of <03 mg/dL) is considered a therapeutic  option.   Thyroid Panel With TSH     Status: None   Collection Time: 11/18/19 10:10 AM  Result Value Ref Range   T3 Uptake 32 22 - 35 %   T4, Total 8.6 5.1 - 11.9 mcg/dL   Free Thyroxine Index 2.8  1.4 - 3.8   TSH 1.41 0.40 - 4.50 mIU/L    -------------------------------------------------------------------------- A&P:  Problem List Items Addressed This Visit      Cardiovascular and Mediastinum   Essential hypertension - Primary    Controlled hypertension.  BP is at goal < 130/80.  Pt is working on lifestyle modifications.  Taking medications tolerating well without side effects.  Complications:  Obesity, GERD, T2DM, HLD  Plan: 1. Continue taking amlodipine 5mg  daily, labetalol 100mg  TID daily and losartan 50mg  daily 2. Obtain labs in 3 months  3. Encouraged heart healthy diet and increasing exercise to 30 minutes most days of the week, going no more than 2 days in a row without exercise. 4. Check BP 1-2 x per week at home, keep log, and bring to clinic at next appointment. 5. Follow up 3 months.            No orders of the defined types were placed in this encounter.   Follow-up: - Return in 3 months for hypertension follow up visit  Patient verbalizes understanding with the above medical recommendations including the limitation of remote medical advice.  Specific follow-up and call-back criteria were given for patient to follow-up or seek medical care more urgently if needed.  - Time spent in direct consultation with patient on phone: 6 minutes  , FNP-C [facility] 12/18/2019, 11:29 AM

## 2019-12-18 NOTE — Assessment & Plan Note (Signed)
Controlled hypertension.  BP is at goal < 130/80.  Pt is working on lifestyle modifications.  Taking medications tolerating well without side effects.  Complications:  Obesity, GERD, T2DM, HLD  Plan: 1. Continue taking amlodipine 5mg  daily, labetalol 100mg  TID daily and losartan 50mg  daily 2. Obtain labs in 3 months  3. Encouraged heart healthy diet and increasing exercise to 30 minutes most days of the week, going no more than 2 days in a row without exercise. 4. Check BP 1-2 x per week at home, keep log, and bring to clinic at next appointment. 5. Follow up 3 months.

## 2019-12-22 ENCOUNTER — Other Ambulatory Visit: Payer: Self-pay | Admitting: Family Medicine

## 2020-01-15 ENCOUNTER — Other Ambulatory Visit: Payer: Self-pay | Admitting: Family Medicine

## 2020-01-15 DIAGNOSIS — E782 Mixed hyperlipidemia: Secondary | ICD-10-CM

## 2020-01-15 DIAGNOSIS — K219 Gastro-esophageal reflux disease without esophagitis: Secondary | ICD-10-CM

## 2020-01-15 DIAGNOSIS — I10 Essential (primary) hypertension: Secondary | ICD-10-CM

## 2020-01-21 DIAGNOSIS — H26492 Other secondary cataract, left eye: Secondary | ICD-10-CM | POA: Diagnosis not present

## 2020-02-02 ENCOUNTER — Other Ambulatory Visit: Payer: Self-pay | Admitting: Family Medicine

## 2020-02-02 ENCOUNTER — Telehealth: Payer: Self-pay | Admitting: Family Medicine

## 2020-02-02 DIAGNOSIS — E1165 Type 2 diabetes mellitus with hyperglycemia: Secondary | ICD-10-CM

## 2020-02-02 MED ORDER — FREESTYLE LITE TEST VI STRP: 1.0000 | ORAL_STRIP | Freq: Every day | 4 refills | Status: DC

## 2020-02-02 NOTE — Telephone Encounter (Signed)
Pt is requesting a refill on Freestyle lite  50 strips called [facility]   [facility]  [address].  Pt call back # is [phone]

## 2020-02-02 NOTE — Telephone Encounter (Signed)
Sent to pharmacy on file 

## 2020-02-03 ENCOUNTER — Other Ambulatory Visit: Payer: Self-pay

## 2020-02-03 DIAGNOSIS — E1165 Type 2 diabetes mellitus with hyperglycemia: Secondary | ICD-10-CM

## 2020-02-03 MED ORDER — FREESTYLE LITE TEST VI STRP: 1.0000 | ORAL_STRIP | Freq: Every day | 4 refills | Status: AC

## 2020-02-08 ENCOUNTER — Other Ambulatory Visit: Payer: Self-pay | Admitting: Family Medicine

## 2020-02-08 DIAGNOSIS — E119 Type 2 diabetes mellitus without complications: Secondary | ICD-10-CM

## 2020-02-08 NOTE — Telephone Encounter (Signed)
Requested Prescriptions  Pending Prescriptions Disp Refills  . metFORMIN (GLUCOPHAGE) 500 MG tablet [Pharmacy Med Name: metFORMIN HCl 500 MG Oral Tablet] 180 tablet 0    Sig: TAKE 1 TABLET BY MOUTH TWICE DAILY WITH A MEAL     Endocrinology:  Diabetes - Biguanides Failed - 02/08/2020  2:31 PM      Failed - Cr in normal range and within 360 days    Creat  Date Value Ref Range Status  11/18/2019 0.91 (H) 0.60 - 0.88 mg/dL Final    Comment:    For patients >25 years of age, the reference limit for Creatinine is approximately 13% higher for people identified as African-American. .          Failed - eGFR in normal range and within 360 days    GFR, Est African American  Date Value Ref Range Status  11/18/2019 67 > OR = 60 mL/min/1.27m Final   GFR, Est Non African American  Date Value Ref Range Status  11/18/2019 58 (L) > OR = 60 mL/min/1.745mFinal         Passed - HBA1C is between 0 and 7.9 and within 180 days    Hemoglobin A1C  Date Value Ref Range Status  11/18/2019 6.2 (A) 4.0 - 5.6 % Final   Hgb A1c MFr Bld  Date Value Ref Range Status  11/05/2015 7.8 (H) 4.8 - 5.6 % Final    Comment:    (NOTE)         Pre-diabetes: 5.7 - 6.4         Diabetes: >6.4         Glycemic control for adults with diabetes: <7.0          Passed - Valid encounter within last 6 months    Recent Outpatient Visits          1 month ago [facility]   2 months ago [facility]   [facility]   2 months ago Type 2 diabetes mellitus with hyperglycemia, [facility] long-term [facility] use of [facility]   8 months ago Type 2 diabetes mellitus with hyperglycemia, without long-term current use of insulin [facility]   1 year ago Chronic midline low back pain with left-sided sciatica   [facility]

## 2020-02-29 ENCOUNTER — Other Ambulatory Visit: Payer: Self-pay | Admitting: Family Medicine

## 2020-02-29 DIAGNOSIS — I10 Essential (primary) hypertension: Secondary | ICD-10-CM

## 2020-02-29 NOTE — Telephone Encounter (Signed)
Requested Prescriptions  Pending Prescriptions Disp Refills  . labetalol (NORMODYNE) 100 MG tablet [Pharmacy Med Name: Labetalol HCl 100 MG Oral Tablet] 270 tablet 0    Sig: TAKE 1 TABLET BY MOUTH THREE TIMES DAILY     Cardiovascular:  Beta Blockers Passed - 02/29/2020  9:42 AM      Passed - Last BP in normal range    BP Readings from Last 1 Encounters:  12/18/19 128/62         Passed - Last Heart Rate in normal range    Pulse Readings from Last 1 Encounters:  12/18/19 61         Passed - Valid encounter within last 6 months    Recent Outpatient Visits          2 months ago Essential hypertension   [facility], Jodelle Gross, FNP   2 months ago Essential hypertension   [facility], Jodelle Gross, FNP   3 months ago Type 2 diabetes mellitus with hyperglycemia, without long-term current use of insulin [facility])   [facility], Jodelle Gross, FNP   9 months ago Type 2 diabetes mellitus with hyperglycemia, without long-term current use of insulin [facility])   [facility], Jodelle [facility], F[facility]   1 year ago Chronic midline low back pain with left-sided sciatica   [facility] Galen Manila, NP

## 2020-04-13 ENCOUNTER — Other Ambulatory Visit: Payer: Self-pay | Admitting: Family Medicine

## 2020-04-13 DIAGNOSIS — E119 Type 2 diabetes mellitus without complications: Secondary | ICD-10-CM

## 2020-04-13 DIAGNOSIS — I10 Essential (primary) hypertension: Secondary | ICD-10-CM

## 2020-04-30 ENCOUNTER — Other Ambulatory Visit: Payer: Self-pay | Admitting: Family Medicine

## 2020-04-30 ENCOUNTER — Other Ambulatory Visit: Payer: Self-pay

## 2020-04-30 DIAGNOSIS — K219 Gastro-esophageal reflux disease without esophagitis: Secondary | ICD-10-CM

## 2020-04-30 MED ORDER — PANTOPRAZOLE SODIUM 40 MG PO TBEC: DELAYED_RELEASE_TABLET | ORAL | 0 refills | Status: DC

## 2020-04-30 NOTE — Telephone Encounter (Signed)
Pt called in for a refill for pantoprazole (PROTONIX) 40 MG tablet.     Pharmacy:  [facility] [address]), [place] - [address] Phone:  [phone]  Fax:  [phone]

## 2020-04-30 NOTE — Telephone Encounter (Signed)
Requested Prescriptions  Pending Prescriptions Disp Refills  . pantoprazole (PROTONIX) 40 MG tablet 90 tablet 0    Sig: TAKE 1 TABLET BY MOUTH ONCE DAILY AT  6AM     Gastroenterology: Proton Pump Inhibitors Passed - 04/30/2020 10:58 AM      Passed - Valid encounter within last 12 months    Recent Outpatient Visits          4 months ago Essential hypertension   [facility], Jodelle Gross, FNP   4 months ago Essential hypertension   [facility], Jodelle Gross, FNP   5 months ago Type 2 diabetes mellitus with hyperglycemia, without long-term current use of insulin [facility])   [facility], Jodelle Gross, FNP   11 months ago Type 2 diabetes mellitus with hyperglycemia, without long-term current use of insulin [facility])   [facility], Jodelle Gross, FNP   1 year ago Chronic midline low back pain with left-sided sciatica   [facility] Galen Manila, NP      Future Appointments            In 1 month Gabriel Cirri, NP [facility], [facility]

## 2020-06-01 ENCOUNTER — Other Ambulatory Visit: Payer: Self-pay

## 2020-06-01 DIAGNOSIS — I10 Essential (primary) hypertension: Secondary | ICD-10-CM

## 2020-06-01 MED ORDER — LABETALOL HCL 100 MG PO TABS: 100.0000 mg | ORAL_TABLET | Freq: Three times a day (TID) | ORAL | 0 refills | Status: DC

## 2020-06-15 ENCOUNTER — Encounter: Payer: Self-pay | Admitting: Unknown Physician Specialty

## 2020-06-15 ENCOUNTER — Other Ambulatory Visit: Payer: Self-pay

## 2020-06-15 ENCOUNTER — Ambulatory Visit (INDEPENDENT_AMBULATORY_CARE_PROVIDER_SITE_OTHER): Payer: PPO | Admitting: Unknown Physician Specialty

## 2020-06-15 DIAGNOSIS — K219 Gastro-esophageal reflux disease without esophagitis: Secondary | ICD-10-CM | POA: Diagnosis not present

## 2020-06-15 DIAGNOSIS — G8929 Other chronic pain: Secondary | ICD-10-CM

## 2020-06-15 DIAGNOSIS — R202 Paresthesia of skin: Secondary | ICD-10-CM

## 2020-06-15 DIAGNOSIS — E782 Mixed hyperlipidemia: Secondary | ICD-10-CM

## 2020-06-15 DIAGNOSIS — I10 Essential (primary) hypertension: Secondary | ICD-10-CM | POA: Diagnosis not present

## 2020-06-15 DIAGNOSIS — M5442 Lumbago with sciatica, left side: Secondary | ICD-10-CM | POA: Diagnosis not present

## 2020-06-15 DIAGNOSIS — E119 Type 2 diabetes mellitus without complications: Secondary | ICD-10-CM

## 2020-06-15 MED ORDER — PANTOPRAZOLE SODIUM 40 MG PO TBEC
DELAYED_RELEASE_TABLET | ORAL | 0 refills | Status: DC
Start: 2020-06-15 — End: 2020-10-25

## 2020-06-15 MED ORDER — METFORMIN HCL 500 MG PO TABS: 500.0000 mg | ORAL_TABLET | Freq: Two times a day (BID) | ORAL | 0 refills | Status: DC

## 2020-06-15 MED ORDER — GABAPENTIN 100 MG PO CAPS: 100.0000 mg | ORAL_CAPSULE | Freq: Every day | ORAL | 1 refills | Status: DC

## 2020-06-15 MED ORDER — SIMVASTATIN 20 MG PO TABS: 20.0000 mg | ORAL_TABLET | Freq: Every day | ORAL | 1 refills | Status: DC

## 2020-06-15 MED ORDER — AMLODIPINE BESYLATE 5 MG PO TABS: 5.0000 mg | ORAL_TABLET | Freq: Every day | ORAL | 3 refills | Status: DC

## 2020-06-15 MED ORDER — LOSARTAN POTASSIUM 50 MG PO TABS: 100.0000 mg | ORAL_TABLET | Freq: Every day | ORAL | 0 refills | Status: DC

## 2020-06-15 NOTE — Assessment & Plan Note (Addendum)
Hgb A1C goal <7.0%. Previous A1C was 6.2%. Will re-check Hgb A1C today. Continue Metformin 500 mg.  Diabetic foot exam and eye exam up-to-date. Patient will continue to work on diet. Will follow up in 6 months.

## 2020-06-15 NOTE — Assessment & Plan Note (Signed)
Blood pressure 136/49 today. Continue amlodipine 5 mg and losartan 50 mg. Will check CMP today. Follow up in 6 months.

## 2020-06-15 NOTE — Assessment & Plan Note (Addendum)
Patient to continue Simvastatin 20 mg. Refill entered. Will follow up in 6 months and repeat lipid panel.

## 2020-06-15 NOTE — Progress Notes (Signed)
BP (!) 136/49 (BP Location: Left Arm, Patient Position: Sitting, Cuff Size: Normal)   Pulse (!) 58   Temp 97.7 F (36.5 C) (Temporal)   Ht 5\' 4"  (1.626 m)   Wt 174 lb 9.6 oz (79.2 kg)   SpO2 97%   BMI 29.97 kg/m    Subjective:    Patient ID: Margaret Mcintosh, female    DOB: [birth year], 87 y.o.   MRN: [id number]  HPI: Margaret Mcintosh is a 87 y.o. female  Chief Complaint  Patient presents with  . Fatigue    Pt states she has been doing some work at home but she has been taking vitamins which has been helping relieve her fatigue.  83 Hypertension    Hypertension Patient does not regularly check Blood pressure at home. Denies headaches and chest pain. Patient states she is taking blood pressure medications as prescribed, without issues.   Fatigue Patient reports some fatigue which has improved with a multivitamin. Patient states she thinks it is due to "old age."    Diabetes Patient also does not check her blood sugar often. States she only checks it when she feels it is high. Patient denies any polyuria and polyphagia. Does watch her diet but has the occasional sweet but has cut back. Patient stays active by working in her flowerbeds.    Relevant past medical, surgical, family and social history reviewed and updated as indicated. Interim medical history since our last visit reviewed. Allergies and medications reviewed and updated.  Review of Systems  Constitutional: Positive for fatigue. Negative for chills and fever.  Respiratory: Negative for cough, chest tightness and shortness of breath.   Cardiovascular: Negative for chest pain, palpitations and leg swelling.  Gastrointestinal: Negative for constipation, diarrhea and nausea.  Endocrine: Negative.   Genitourinary: Negative.   Skin: Negative.   Allergic/Immunologic: Positive for environmental allergies.  Neurological: Negative for dizziness and headaches.    Per HPI unless specifically indicated above      Objective:    BP (!) 136/49 (BP Location: Left Arm, Patient Position: Sitting, Cuff Size: Normal)   Pulse (!) 58   Temp 97.7 F (36.5 C) (Temporal)   Ht 5\' 4"  (1.626 m)   Wt 174 lb 9.6 oz (79.2 kg)   SpO2 97%   BMI 29.97 kg/m   Wt Readings from Last 3 Encounters:  06/15/20 174 lb 9.6 oz (79.2 kg)  12/04/19 177 lb (80.3 kg)  11/18/19 177 lb (80.3 kg)    Physical Exam Constitutional:      Appearance: Normal appearance.  HENT:     Head: Normocephalic.  Cardiovascular:     Rate and Rhythm: Normal rate and regular rhythm.     Pulses: Normal pulses.     Heart sounds: Normal heart sounds.  Pulmonary:     Effort: Pulmonary effort is normal.     Breath sounds: Normal breath sounds.  Abdominal:     General: Bowel sounds are normal.  Musculoskeletal:     Right lower leg: No edema.     Left lower leg: No edema.  Skin:    General: Skin is warm and dry.     Capillary Refill: Capillary refill takes less than 2 seconds.  Neurological:     Mental Status: She is alert and oriented to person, place, and time.  Psychiatric:        Mood and Affect: Mood normal.        Behavior: Behavior normal.     Results for  orders placed or performed in visit on 12/22/19  HM DIABETES EYE EXAM  Result Value Ref Range   HM Diabetic Eye Exam No Retinopathy No Retinopathy      Assessment & Plan:   Problem List Items Addressed This Visit      Cardiovascular and Mediastinum   Essential hypertension    Blood pressure 136/49 today. Continue amlodipine 5 mg and losartan 50 mg. Will check CMP today. Follow up in 6 months.       Relevant Medications   amLODipine (NORVASC) 5 MG tablet   simvastatin (ZOCOR) 20 MG tablet   losartan (COZAAR) 50 MG tablet   Other Relevant Orders   Comprehensive metabolic panel     Endocrine   Diabetes (HCC)    Hgb A1C goal <7.0%. Previous A1C was 6.2%. Will re-check Hgb A1C today. Continue Metformin 500 mg.  Diabetic foot exam and eye exam up-to-date. Patient will  continue to work on diet. Will follow up in 6 months.       Relevant Medications   simvastatin (ZOCOR) 20 MG tablet   losartan (COZAAR) 50 MG tablet   metFORMIN (GLUCOPHAGE) 500 MG tablet   Other Relevant Orders   Hemoglobin A1c     Other   Hyperlipemia    Patient to continue Simvastatin 20 mg. Refill entered. Will follow up in 6 months and repeat lipid panel.       Relevant Medications   amLODipine (NORVASC) 5 MG tablet   simvastatin (ZOCOR) 20 MG tablet   losartan (COZAAR) 50 MG tablet    Other Visit Diagnoses    Chronic midline low back pain with left-sided sciatica       Stable. Continue current medication.    Relevant Medications   gabapentin (NEURONTIN) 100 MG capsule   Paresthesia       Stable. Continue current medication of Gabapentin.    Relevant Medications   gabapentin (NEURONTIN) 100 MG capsule   Gastroesophageal reflux disease       Stable. Continue current Pantoprazole 40 mg.   Relevant Medications   pantoprazole (PROTONIX) 40 MG tablet       Follow up plan: Return in about 6 months (around 12/16/2020).

## 2020-06-15 NOTE — Assessment & Plan Note (Deleted)
Stable. Continue current Pantoprazole 40 mg.

## 2020-06-16 LAB — COMPREHENSIVE METABOLIC PANEL
AG Ratio: 1.7 (calc) (ref 1.0–2.5)
ALT: 14 U/L (ref 6–29)
AST: 15 U/L (ref 10–35)
Albumin: 4.5 g/dL (ref 3.6–5.1)
Alkaline phosphatase (APISO): 57 U/L (ref 37–153)
BUN/Creatinine Ratio: 14 (calc) (ref 6–22)
BUN: 15 mg/dL (ref 7–25)
CO2: 30 mmol/L (ref 20–32)
Calcium: 10.1 mg/dL (ref 8.6–10.4)
Chloride: 103 mmol/L (ref 98–110)
Creat: 1.04 mg/dL — ABNORMAL HIGH (ref 0.60–0.88)
Globulin: 2.7 g/dL (calc) (ref 1.9–3.7)
Glucose, Bld: 126 mg/dL — ABNORMAL HIGH (ref 65–99)
Potassium: 4.6 mmol/L (ref 3.5–5.3)
Sodium: 140 mmol/L (ref 135–146)
Total Bilirubin: 0.8 mg/dL (ref 0.2–1.2)
Total Protein: 7.2 g/dL (ref 6.1–8.1)

## 2020-06-16 LAB — HEMOGLOBIN A1C
Hgb A1c MFr Bld: 6.4 % of total Hgb — ABNORMAL HIGH (ref <5.7)
Mean Plasma Glucose: 137 mg/dL
eAG (mmol/L): 7.6 mmol/L

## 2020-07-16 ENCOUNTER — Telehealth: Payer: Self-pay | Admitting: Family Medicine

## 2020-07-16 NOTE — Telephone Encounter (Signed)
Pharmacy called to ask the nurse or doctor to call regarding a script for   losartan (COZAAR) 50 MG tablet.  Stated that there is a problem with the directions for the medication.  Please advise and call Toni Amend or Lurena Joiner at [phone]

## 2020-07-21 ENCOUNTER — Other Ambulatory Visit: Payer: Self-pay

## 2020-07-21 DIAGNOSIS — E782 Mixed hyperlipidemia: Secondary | ICD-10-CM

## 2020-07-21 MED ORDER — SIMVASTATIN 20 MG PO TABS: 20.0000 mg | ORAL_TABLET | Freq: Every day | ORAL | 1 refills | Status: DC

## 2020-08-18 ENCOUNTER — Ambulatory Visit: Payer: Self-pay

## 2020-08-18 DIAGNOSIS — M25551 Pain in right hip: Secondary | ICD-10-CM | POA: Diagnosis not present

## 2020-08-18 DIAGNOSIS — S7001XA Contusion of right hip, initial encounter: Secondary | ICD-10-CM | POA: Diagnosis not present

## 2020-08-18 NOTE — Telephone Encounter (Signed)
Will review ED note once available.

## 2020-08-18 NOTE — Telephone Encounter (Signed)
Pt. Reports she fell at home the end of April. Injured her right hip. Can walk on it.Hurts when she is sitting. No availability today. States she is going to ED for X-rays.  Reason for Disposition . [1] After 3 days AND [2] pain not improved  Answer Assessment - Initial Assessment Questions 1. MECHANISM: "How did the injury happen?"     Fell on stairs 2. WHEN: "When did the injury happen?" (Minutes or hours ago)     End of April 3. LOCATION: "Where is the injury located?" (front, back, or side of hip). "Which hip is injured?"     Right hip 4. APPEARANCE of INJURY: "What does the injury look like?"     Unsure of bruising 5. SEVERITY: "Can your child put weight on the leg?" "Can he walk?"     Can walk on it 6. SIZE: For bruises or swelling, ask: "How large is it? (inches or centimeters)     n/a 7. PAIN: "Is there pain?" If so, ask: "How bad is the pain?"     Now - 9 8. TETANUS: For any breaks in the skin, ask: "When was the last tetanus booster?"     Unsure  Protocols used: HIP INJURY-P-AH

## 2020-08-31 ENCOUNTER — Encounter: Payer: Self-pay | Admitting: Internal Medicine

## 2020-08-31 ENCOUNTER — Ambulatory Visit (INDEPENDENT_AMBULATORY_CARE_PROVIDER_SITE_OTHER): Payer: PPO | Admitting: Internal Medicine

## 2020-08-31 ENCOUNTER — Other Ambulatory Visit: Payer: Self-pay | Admitting: Family Medicine

## 2020-08-31 ENCOUNTER — Other Ambulatory Visit: Payer: Self-pay

## 2020-08-31 VITALS — BP 176/55 | HR 62 | Temp 98.4°F | Resp 17 | Ht 64.0 in | Wt 170.8 lb

## 2020-08-31 DIAGNOSIS — M25551 Pain in right hip: Secondary | ICD-10-CM

## 2020-08-31 DIAGNOSIS — G8929 Other chronic pain: Secondary | ICD-10-CM

## 2020-08-31 DIAGNOSIS — I10 Essential (primary) hypertension: Secondary | ICD-10-CM

## 2020-08-31 MED ORDER — TRAMADOL HCL 50 MG PO TABS: 50.0000 mg | ORAL_TABLET | Freq: Three times a day (TID) | ORAL | 0 refills | Status: AC | PRN

## 2020-08-31 NOTE — Telephone Encounter (Signed)
   Notes to clinic:  Patient has appt today    Requested Prescriptions  Pending Prescriptions Disp Refills   labetalol (NORMODYNE) 100 MG tablet [Pharmacy Med Name: Labetalol HCl 100 MG Oral Tablet] 270 tablet 0    Sig: TAKE 1 TABLET BY MOUTH THREE TIMES DAILY      Cardiovascular:  Beta Blockers Failed - 08/31/2020  9:56 AM      Failed - Last BP in normal range    BP Readings from Last 1 Encounters:  06/15/20 (!) 136/49          Passed - Last Heart Rate in normal range    Pulse Readings from Last 1 Encounters:  06/15/20 (!) 58          Passed - Valid encounter within last 6 months    Recent Outpatient Visits           2 months ago Essential hypertension   [facility] Gabriel Cirri, NP   8 months ago Essential hypertension   [facility], Jodelle Gross, FNP   9 months ago Essential hypertension   [facility], Jodelle Gross, FNP   9 months ago Type 2 diabetes mellitus with hyperglycemia, without long-term current use of insulin [facility])   [facility], Jodelle Gross, FNP   1 year ago Type 2 diabetes mellitus with hyperglycemia, without long-term current use of insulin (HCC)   [facility], Jodelle Gross, FNP       Future Appointments             Today Baity, Salvadore Oxford, NP [facility]   In 3 months [place], Salvadore Oxford, NP [facility], [facility]

## 2020-08-31 NOTE — Progress Notes (Signed)
Subjective:    Patient ID: Margaret Mcintosh, female    DOB: 29-May-1932, 87 y.o.   MRN: [id number]  HPI  Pt present to the clinic today for UC follow up. She went to the UC on 6/1 with c/o right hip pain s/p a fall that occurred at the end of April. She reports they took an xray which showed a bone spur of her right hip. She describes the pain as sharp and stabbing, worse when she tries to sit down or stand up. The pain does not radiate. She does have intermittent low back pain if she "overdoes" but this pain does not radiate into her hip or down or leg. She denies numbness, tingling, or weakness of her right lower extremity. She had no hip pain prior to this fall. She is taking Tylenol and Ibuprofen OTC without any relief of symptoms.   Review of Systems      Past Medical History:  Diagnosis Date  . Arthritis   . Cataract   . Complication of anesthesia    "takes a long time to wake me up" (11/04/2015)  . Diabetes mellitus (HCC)   . GERD (gastroesophageal reflux disease)   . Glaucoma   . Hypertension   . Stroke [facility])    last 8/17. Mild left arm/leg weakness  . TIA (transient ischemic attack) 11/04/2015    Current Outpatient Medications  Medication Sig Dispense Refill  . acetaminophen (TYLENOL) 325 MG tablet Take 325-650 mg by mouth every 6 (six) hours as needed for mild pain.     Marland Kitchen amLODipine (NORVASC) 5 MG tablet Take 1 tablet (5 mg total) by mouth daily. 90 tablet 3  . Cholecalciferol (VITAMIN D3 PO) Take by mouth.    Clinical research associate Bandages & Supports (MEDICAL COMPRESSION SOCKS) MISC 2 Devices by Does not apply route daily. 2 each 0  . FREESTYLE LITE test strip 1 each by Other route daily. Use to test blood sugar up to 4 times daily. 90 each 4  . gabapentin (NEURONTIN) 100 MG capsule Take 1 capsule (100 mg total) by mouth at bedtime. INCREASE to 1 capsule in am and at bedtime after 2 weeks if needed. 60 capsule 1  . labetalol (NORMODYNE) 100 MG tablet Take 1 tablet (100 mg total) by  mouth 3 (three) times daily. 270 tablet 0  . losartan (COZAAR) 50 MG tablet Take 2 tablets (100 mg total) by mouth daily. with food 270 tablet 0  . metFORMIN (GLUCOPHAGE) 500 MG tablet Take 1 tablet (500 mg total) by mouth 2 (two) times daily with a meal. 180 tablet 0  . Multiple Vitamin (MULTIVITAMIN) tablet Take 1 tablet by mouth daily.    . Omega-3 Fatty Acids (FISH OIL) 1200 MG CAPS Take by mouth 3 (three) times daily.    . pantoprazole (PROTONIX) 40 MG tablet TAKE 1 TABLET BY MOUTH ONCE DAILY AT  6AM 90 tablet 0  . simvastatin (ZOCOR) 20 MG tablet Take 1 tablet (20 mg total) by mouth at bedtime. 90 tablet 1   No current facility-administered medications for this visit.    Allergies  Allergen Reactions  . Codeine Other (See Comments)    Gets nausea and feels like"something is crawling" "somethings crawling in my head"   . Diphenhydramine Hcl Other (See Comments)    Feels like skin crawling "somethings crawling in my head"   . Other Other (See Comments)    CANNOT HAVE ANY "SPICY" FOODS!!    Family History  Problem Relation Age  of Onset  . Hypertension Mother   . Diabetes Mother   . Heart disease Mother   . Cancer Sister   . Diabetes Brother   . Hypertension Daughter   . Other Son        Pituitary gland disease  . Hypertension Son   . Obesity Son   . Colon cancer Brother   . Diabetes Brother   . Alzheimer's disease Brother   . Diabetes Sister     Social History   Socioeconomic History  . Marital status: Widowed    Spouse name: Not on file  . Number of children: 2  . Years of education: Not on file  . Highest education level: Not on file  Occupational History  . Occupation: Retired  Tobacco Use  . Smoking status: Never Smoker  . Smokeless tobacco: Never Used  Vaping Use  . Vaping Use: Never used  Substance and Sexual Activity  . Alcohol use: No  . Drug use: No  . Sexual activity: Not Currently  Other Topics Concern  . Not on file  Social History  Narrative  . Not on file   Social Determinants of Health   Financial Resource Strain: Low Risk   . Difficulty of Paying Living Expenses: Not hard at all  Food Insecurity: No Food Insecurity  . Worried About Programme researcher, broadcasting/film/video in the Last Year: Never true  . Ran Out of Food in the Last Year: Never true  Transportation Needs: No Transportation Needs  . Lack of Transportation (Medical): No  . Lack of Transportation (Non-Medical): No  Physical Activity: Inactive  . Days of Exercise per Week: 0 days  . Minutes of Exercise per Session: 0 min  Stress: Stress Concern Present  . Feeling of Stress : To some extent  Social Connections: Not on file  Intimate Partner Violence: Not on file     Constitutional: Denies fever, malaise, fatigue, headache or abrupt weight changes.  Respiratory: Denies difficulty breathing, shortness of breath, cough or sputum production.   Cardiovascular: Denies chest pain, chest tightness, palpitations or swelling in the hands or feet.  GMusculoskeletal: Pt reports right hip pain. Denies decrease in range of motion, difficulty with gait, muscle pain or joint swelling.  Neurological: Denies numbness, tingling, weakness or problems with balance and coordination.    No other specific complaints in a complete review of systems (except as listed in HPI above).  Objective:   Physical Exam   BP (!) 176/55 (BP Location: Right Arm, Patient Position: Sitting, Cuff Size: Normal)   Pulse 62   Temp 98.4 F (36.9 C) (Temporal)   Resp 17   Ht 5\' 4"  (1.626 m)   Wt 170 lb 12.8 oz (77.5 kg)   SpO2 98%   BMI 29.32 kg/m   Wt Readings from Last 3 Encounters:  06/15/20 174 lb 9.6 oz (79.2 kg)  12/04/19 177 lb (80.3 kg)  11/18/19 177 lb (80.3 kg)    General: Appears her stated age, appears in pain but in NAD. Cardiovascular: Normal rate and rhythm. S1,S2 noted.  No murmur, rubs or gallops noted. No JVD or BLE edema.  Pulmonary/Chest: Normal effort and positive  vesicular breath sounds. No respiratory distress. No wheezes, rales or ronchi noted.  Musculoskeletal: Pain with flexion, extension, adduction, and internal rotation of the right hip. Normal abduction and external rotation of the right hip. Pain with palpation over the right trochanter. Strength 5/5 BLE. She has noted difficulty changing positions. Gait slow and steady with  use of cane. Neurological: Alert and oriented.   BMET    Component Value Date/Time   NA 140 06/15/2020 1055   NA 135 (L) 04/09/2013 1025   K 4.6 06/15/2020 1055   K 3.8 04/09/2013 1025   CL 103 06/15/2020 1055   CL 103 04/09/2013 1025   CO2 30 06/15/2020 1055   CO2 28 04/09/2013 1025   GLUCOSE 126 (H) 06/15/2020 1055   GLUCOSE 211 (H) 04/09/2013 1025   BUN 15 06/15/2020 1055   BUN 14 04/09/2013 1025   CREATININE 1.04 (H) 06/15/2020 1055   CALCIUM 10.1 06/15/2020 1055   CALCIUM 9.2 04/09/2013 1025   GFRNONAA 58 (L) 11/18/2019 1010   GFRAA 67 11/18/2019 1010    Lipid Panel     Component Value Date/Time   CHOL 121 11/18/2019 1010   TRIG 66 11/18/2019 1010   HDL 56 11/18/2019 1010   CHOLHDL 2.2 11/18/2019 1010   VLDL 23 11/05/2015 0549   LDLCALC 50 11/18/2019 1010    CBC    Component Value Date/Time   WBC 4.2 11/18/2019 1010   RBC 3.85 11/18/2019 1010   HGB 10.1 (L) 11/18/2019 1010   HGB 14.9 04/09/2013 1025   HCT 32.5 (L) 11/18/2019 1010   HCT 44.4 04/09/2013 1025   PLT 175 11/18/2019 1010   PLT 184 04/09/2013 1025   MCV 84.4 11/18/2019 1010   MCV 89 04/09/2013 1025   MCH 26.2 (L) 11/18/2019 1010   MCHC 31.1 (L) 11/18/2019 1010   RDW 14.0 11/18/2019 1010   RDW 13.5 04/09/2013 1025   LYMPHSABS 752 (L) 11/18/2019 1010   MONOABS 0.6 11/05/2015 0938   EOSABS 50 11/18/2019 1010   BASOSABS 29 11/18/2019 1010    Hgb A1C Lab Results  Component Value Date   HGBA1C 6.4 (H) 06/15/2020           Assessment & Plan:   UC Follow Up for Right Hip Pain, Bone Spur of Right Hip s/p Fall:  Xray  for UC reviewed- pt brought report with her, scanned into chart Encouraged her to continue Tylenol and Ibuprofen OTC RX for Tramadol 50 mg Q8H prn for severe pain Hip exercises given Referral to orthopedics placed for further evaluation of symptoms.    Return precautions discussed, RTC in 3 months for Medicare Wellness Exam  Nicki Reaper, NP This visit occurred during the SARS-CoV-2 public health emergency.  Safety protocols were in place, including screening questions prior to the visit, additional usage of staff PPE, and extensive cleaning of exam room while observing appropriate contact time as indicated for disinfecting solutions.

## 2020-08-31 NOTE — Patient Instructions (Signed)
Hip Exercises Ask your health care provider which exercises are safe for you. Do exercises exactly as told by your health care provider and adjust them as directed. It is normal to feel mild stretching, pulling, tightness, or discomfort as you do these exercises. Stop right away if you feel sudden pain or your pain gets worse. Do not begin these exercises until told by your health care provider. Stretching and range-of-motion exercises These exercises warm up your muscles and joints and improve the movement and flexibility of your hip. These exercises also help to relieve pain, numbness, and tingling. You may be asked to limit your range of motion if you had a hip replacement. Talk to your health care provider about these restrictions. Hamstrings, supine 1. Lie on your back (supine position). 2. Loop a belt or towel over the ball of your left / right foot. The ball of your foot is on the walking surface, right under your toes. 3. Straighten your left / right knee and slowly pull on the belt or towel to raise your leg until you feel a gentle stretch behind your knee (hamstring). ? Do not let your knee bend while you do this. ? Keep your other leg flat on the floor. 4. Hold this position for __________ seconds. 5. Slowly return your leg to the starting position. Repeat __________ times. Complete this exercise __________ times a day.   Hip rotation 1. Lie on your back on a firm surface. 2. With your left / right hand, gently pull your left / right knee toward the shoulder that is on the same side of the body. Stop when your knee is pointing toward the ceiling. 3. Hold your left / right ankle with your other hand. 4. Keeping your knee steady, gently pull your left / right ankle toward your other shoulder until you feel a stretch in your buttocks. ? Keep your hips and shoulders firmly planted while you do this stretch. 5. Hold this position for __________ seconds. Repeat __________ times. Complete  this exercise __________ times a day.   Seated stretch This exercise is sometimes called hamstrings and adductors stretch. 1. Sit on the floor with your legs stretched wide. Keep your knees straight during this exercise. 2. Keeping your head and back in a straight line, bend at your waist to reach for your left foot (position A). You should feel a stretch in your right inner thigh (adductors). 3. Hold this position for __________ seconds. Then slowly return to the upright position. 4. Keeping your head and back in a straight line, bend at your waist to reach forward (position B). You should feel a stretch behind both of your thighs and knees (hamstrings). 5. Hold this position for __________ seconds. Then slowly return to the upright position. 6. Keeping your head and back in a straight line, bend at your waist to reach for your right foot (position C). You should feel a stretch in your left inner thigh (adductors). 7. Hold this position for __________ seconds. Then slowly return to the upright position. Repeat __________ times. Complete this exercise __________ times a day.   Lunge This exercise stretches the muscles of the hip (hip flexors). 1. Place your left / right knee on the floor and bend your other knee so that is directly over your ankle. You should be half-kneeling. 2. Keep good posture with your head over your shoulders. 3. Tighten your buttocks to point your tailbone downward. This will prevent your back from arching too much. 4.   You should feel a gentle stretch in the front of your left / right thigh and hip. If you do not feel a stretch, slide your other foot forward slightly and then slowly lunge forward with your chest up until your knee once again lines up over your ankle. ? Make sure your tailbone continues to point downward. 5. Hold this position for __________ seconds. 6. Slowly return to the starting position. Repeat __________ times. Complete this exercise __________ times  a day.   Strengthening exercises These exercises build strength and endurance in your hip. Endurance is the ability to use your muscles for a long time, even after they get tired. Bridge This exercise strengthens the muscles of your hip (hip extensors). 1. Lie on your back on a firm surface with your knees bent and your feet flat on the floor. 2. Tighten your buttocks muscles and lift your bottom off the floor until the trunk of your body and your hips are level with your thighs. ? Do not arch your back. ? You should feel the muscles working in your buttocks and the back of your thighs. If you do not feel these muscles, slide your feet 1-2 inches (2.5-5 cm) farther away from your buttocks. 3. Hold this position for __________ seconds. 4. Slowly lower your hips to the starting position. 5. Let your muscles relax completely between repetitions. Repeat __________ times. Complete this exercise __________ times a day.   Straight leg raises, side-lying This exercise strengthens the muscles that move the hip joint away from the center of the body (hip abductors). 1. Lie on your side with your left / right leg in the top position. Lie so your head, shoulder, hip, and knee line up. You may bend your bottom knee slightly to help you balance. 2. Roll your hips slightly forward, so your hips are stacked directly over each other and your left / right knee is facing forward. 3. Leading with your heel, lift your top leg 4-6 inches (10-15 cm). You should feel the muscles in your top hip lifting. ? Do not let your foot drift forward. ? Do not let your knee roll toward the ceiling. 4. Hold this position for __________ seconds. 5. Slowly return to the starting position. 6. Let your muscles relax completely between repetitions. Repeat __________ times. Complete this exercise __________ times a day.   Straight leg raises, side-lying This exercise strengthens the muscles that move the hip joint toward the center  of the body (hip adductors). 1. Lie on your side with your left / right leg in the bottom position. Lie so your head, shoulder, hip, and knee line up. You may place your upper foot in front to help you balance. 2. Roll your hips slightly forward, so your hips are stacked directly over each other and your left / right knee is facing forward. 3. Tense the muscles in your inner thigh and lift your bottom leg 4-6 inches (10-15 cm). 4. Hold this position for __________ seconds. 5. Slowly return to the starting position. 6. Let your muscles relax completely between repetitions. Repeat __________ times. Complete this exercise __________ times a day.   Straight leg raises, supine This exercise strengthens the muscles in the front of your thigh (quadriceps). 1. Lie on your back (supine position) with your left / right leg extended and your other knee bent. 2. Tense the muscles in the front of your left / right thigh. You should see your kneecap slide up or see increased dimpling just   above your knee. 3. Keep these muscles tight as you raise your leg 4-6 inches (10-15 cm) off the floor. Do not let your knee bend. 4. Hold this position for __________ seconds. 5. Keep these muscles tense as you lower your leg. 6. Relax the muscles slowly and completely between repetitions. Repeat __________ times. Complete this exercise __________ times a day.   Hip abductors, standing This exercise strengthens the muscles that move the leg and hip joint away from the center of the body (hip abductors). 1. Tie one end of a rubber exercise band or tubing to a secure surface, such as a chair, table, or pole. 2. Loop the other end of the band or tubing around your left / right ankle. 3. Keeping your ankle with the band or tubing directly opposite the secured end, step away until there is tension in the tubing or band. Hold on to a chair, table, or pole as needed for balance. 4. Lift your left / right leg out to your side.  While you do this: ? Keep your back upright. ? Keep your shoulders over your hips. ? Keep your toes pointing forward. ? Make sure to use your hip muscles to slowly lift your leg. Do not tip your body or forcefully lift your leg. 5. Hold this position for __________ seconds. 6. Slowly return to the starting position. Repeat __________ times. Complete this exercise __________ times a day. Squats This exercise strengthens the muscles in the front of your thigh (quadriceps). 1. Stand in a door frame so your feet and knees are in line with the frame. You may place your hands on the frame for balance. 2. Slowly bend your knees and lower your hips like you are going to sit in a chair. ? Keep your lower legs in a straight-up-and-down position. ? Do not let your hips go lower than your knees. ? Do not bend your knees lower than told by your health care provider. ? If your hip pain increases, do not bend as low. 3. Hold this position for ___________ seconds. 4. Slowly push with your legs to return to standing. Do not use your hands to pull yourself to standing. Repeat __________ times. Complete this exercise __________ times a day. This information is not intended to replace advice given to you by your health care provider. Make sure you discuss any questions you have with your health care provider. Document Revised: 10/17/2018 Document Reviewed: 01/22/2018 Elsevier Patient Education  2021 [employer] Inc.  

## 2020-10-19 ENCOUNTER — Other Ambulatory Visit: Payer: Self-pay | Admitting: Internal Medicine

## 2020-10-19 DIAGNOSIS — E119 Type 2 diabetes mellitus without complications: Secondary | ICD-10-CM

## 2020-10-19 MED ORDER — METFORMIN HCL 500 MG PO TABS: 500.0000 mg | ORAL_TABLET | Freq: Two times a day (BID) | ORAL | 0 refills | Status: DC

## 2020-10-19 NOTE — Telephone Encounter (Signed)
Medication Refill - Medication: metformin 500 mg twice a day. Pt has an appt with regina on 12-16-2020  Has the patient contacted their pharmacy? Yes. Per pt pharm said waiting on provider Preferred Pharmacy (with phone number or street name): [facility] [address] phone number [phone]  Agent: Please be advised that RX refills may take up to 3 business days. We ask that you follow-up with your pharmacy.

## 2020-10-25 ENCOUNTER — Other Ambulatory Visit: Payer: Self-pay | Admitting: Unknown Physician Specialty

## 2020-10-25 DIAGNOSIS — K219 Gastro-esophageal reflux disease without esophagitis: Secondary | ICD-10-CM

## 2020-10-25 NOTE — Telephone Encounter (Signed)
Requested Prescriptions  Pending Prescriptions Disp Refills  . pantoprazole (PROTONIX) 40 MG tablet [Pharmacy Med Name: Pantoprazole Sodium 40 MG Oral Tablet Delayed Release] 90 tablet 0    Sig: TAKE 1 TABLET BY MOUTH ONCE DAILY AT  6  AM     Gastroenterology: Proton Pump Inhibitors Passed - 10/25/2020  6:14 PM      Passed - Valid encounter within last 12 months    Recent Outpatient Visits          1 month ago Chronic right hip pain   [facility] [place], Salvadore Oxford, NP   4 months ago Essential hypertension   [facility] Gabriel Cirri, NP   10 months ago Essential hypertension   [facility], Jodelle Gross, FNP   10 months ago Essential hypertension   [facility], Jodelle Gross, FNP   11 months ago Type 2 diabetes mellitus with hyperglycemia, without long-term current use of insulin [facility])   [facility], Jodelle Gross, FNP      Future Appointments            In 1 month Baity, Salvadore Oxford, NP [facility], [facility]

## 2020-11-01 ENCOUNTER — Other Ambulatory Visit: Payer: Self-pay | Admitting: Unknown Physician Specialty

## 2020-11-01 DIAGNOSIS — I10 Essential (primary) hypertension: Secondary | ICD-10-CM

## 2020-11-02 ENCOUNTER — Telehealth: Payer: Self-pay

## 2020-11-02 DIAGNOSIS — I10 Essential (primary) hypertension: Secondary | ICD-10-CM

## 2020-11-02 NOTE — Telephone Encounter (Signed)
  Notes to clinic:  Review directions for refill Patient to take 2 or 3 tabs daily    Requested Prescriptions  Pending Prescriptions Disp Refills   losartan (COZAAR) 50 MG tablet 270 tablet 0    Sig: Take 2 tablets (100 mg total) by mouth daily. with food      Cardiovascular:  Angiotensin Receptor Blockers Failed - 11/02/2020  2:23 PM      Failed - Cr in normal range and within 180 days    Creat  Date Value Ref Range Status  06/15/2020 1.04 (H) 0.60 - 0.88 mg/dL Final    Comment:    For patients >51 years of age, the reference limit for Creatinine is approximately 13% higher for people identified as African-American. .           Failed - Last BP in normal range    BP Readings from Last 1 Encounters:  08/31/20 (!) 176/55          Passed - K in normal range and within 180 days    Potassium  Date Value Ref Range Status  06/15/2020 4.6 3.5 - 5.3 mmol/L Final  04/09/2013 3.8 3.5 - 5.1 mmol/L Final          Passed - Patient is not pregnant      Passed - Valid encounter within last 6 months    Recent Outpatient Visits           2 months ago Chronic right hip pain   [facility] [place], Salvadore Oxford, NP   4 months ago Essential hypertension   [facility] Gabriel Cirri, NP   10 months ago Essential hypertension   [facility], Jodelle Gross, FNP   11 months ago Essential hypertension   [facility], Jodelle Gross, FNP   11 months ago Type 2 diabetes mellitus with hyperglycemia, without long-term current use of insulin [facility])   [facility], Jodelle Gross, FNP       Future Appointments             In 1 month Baity, Salvadore Oxford, NP [facility], [facility]

## 2020-11-03 MED ORDER — LOSARTAN POTASSIUM 50 MG PO TABS: 100.0000 mg | ORAL_TABLET | Freq: Every day | ORAL | 0 refills | Status: DC

## 2020-11-03 NOTE — Telephone Encounter (Signed)
Patient need this script sent asap. She states that she takes 2am and 1 at night.

## 2020-11-11 ENCOUNTER — Telehealth: Payer: Self-pay

## 2020-11-11 NOTE — Telephone Encounter (Signed)
I spoke with the patient and clarified her concerns. Appt scheduled to f/u on blood pressure in 2 weeks.

## 2020-11-11 NOTE — Telephone Encounter (Signed)
Last note from Gabriel Cirri, NP says that she should be taking 50 mg of losartan daily.  Would recommend she do this for 2 weeks and then follow-up with me.

## 2020-11-11 NOTE — Telephone Encounter (Signed)
The pt was notified of Margaret Mcintosh recommendation. She verbalize understanding. Appt scheduled

## 2020-11-11 NOTE — Telephone Encounter (Addendum)
Pt called in for assistance. Pt says that her current Rx for Losartan has different directions than her previous Rx. Pt says that her previous Rx read takes 2am and 1 at night. pt would like to have clarity.   Also pt says that she is currently in [place]. Pt says that she had her Rx sent to her there. Pt says that she only received a portion of her Rx. Pt would like further assistance with this.    Pharmacy: [facility] - [address] [place], [place]

## 2020-11-11 NOTE — Telephone Encounter (Signed)
Copied from CRM [id number]. Topic: General - Other >> Nov 11, 2020 10:50 AM Payton Spark N wrote: Reason for CRM: pts son called in stating if someone could give him a call back about medication losartan (COZAAR) 50 MG tablet. Pt is confused on how the medication is supposed to be taken as well as if the mg are correct, due to past medication having different instructions. Please advise.

## 2020-11-19 ENCOUNTER — Other Ambulatory Visit: Payer: Self-pay

## 2020-11-19 ENCOUNTER — Encounter: Payer: Self-pay | Admitting: Internal Medicine

## 2020-11-19 ENCOUNTER — Ambulatory Visit (INDEPENDENT_AMBULATORY_CARE_PROVIDER_SITE_OTHER): Payer: PPO | Admitting: Internal Medicine

## 2020-11-19 VITALS — BP 143/51 | HR 66 | Temp 97.7°F | Resp 17 | Ht 64.0 in | Wt 172.6 lb

## 2020-11-19 DIAGNOSIS — I10 Essential (primary) hypertension: Secondary | ICD-10-CM

## 2020-11-19 DIAGNOSIS — E1165 Type 2 diabetes mellitus with hyperglycemia: Secondary | ICD-10-CM | POA: Diagnosis not present

## 2020-11-19 DIAGNOSIS — K219 Gastro-esophageal reflux disease without esophagitis: Secondary | ICD-10-CM | POA: Diagnosis not present

## 2020-11-19 DIAGNOSIS — E782 Mixed hyperlipidemia: Secondary | ICD-10-CM

## 2020-11-19 DIAGNOSIS — E663 Overweight: Secondary | ICD-10-CM

## 2020-11-19 DIAGNOSIS — Z6829 Body mass index (BMI) 29.0-29.9, adult: Secondary | ICD-10-CM | POA: Insufficient documentation

## 2020-11-19 DIAGNOSIS — I6930 Unspecified sequelae of cerebral infarction: Secondary | ICD-10-CM

## 2020-11-19 MED ORDER — LOSARTAN POTASSIUM 50 MG PO TABS: 50.0000 mg | ORAL_TABLET | Freq: Three times a day (TID) | ORAL | 2 refills | Status: DC

## 2020-11-19 NOTE — Assessment & Plan Note (Signed)
C-Met and lipid profile today Continue Simvastatin, will adjust if needed based on labs

## 2020-11-19 NOTE — Assessment & Plan Note (Signed)
Encouraged her to avoid foods that trigger her reflux Continue Pantoprazole

## 2020-11-19 NOTE — Patient Instructions (Signed)
https://www.nhlbi.nih.gov/files/docs/public/heart/dash_brief.pdf">  DASH Eating Plan DASH stands for Dietary Approaches to Stop Hypertension. The DASH eating plan is a healthy eating plan that has been shown to: Reduce high blood pressure (hypertension). Reduce your risk for type 2 diabetes, heart disease, and stroke. Help with weight loss. What are tips for following this plan? Reading food labels Check food labels for the amount of salt (sodium) per serving. Choose foods with less than 5 percent of the Daily Value of sodium. Generally, foods with less than 300 milligrams (mg) of sodium per serving fit into this eating plan. To find whole grains, look for the word "whole" as the first word in the ingredient list. Shopping Buy products labeled as "low-sodium" or "no salt added." Buy fresh foods. Avoid canned foods and pre-made or frozen meals. Cooking Avoid adding salt when cooking. Use salt-free seasonings or herbs instead of table salt or sea salt. Check with your health care provider or pharmacist before using salt substitutes. Do not fry foods. Cook foods using healthy methods such as baking, boiling, grilling, roasting, and broiling instead. Cook with heart-healthy oils, such as olive, canola, avocado, soybean, or sunflower oil. Meal planning  Eat a balanced diet that includes: 4 or more servings of fruits and 4 or more servings of vegetables each day. Try to fill one-half of your plate with fruits and vegetables. 6-8 servings of whole grains each day. Less than 6 oz (170 g) of lean meat, poultry, or fish each day. A 3-oz (85-g) serving of meat is about the same size as a deck of cards. One egg equals 1 oz (28 g). 2-3 servings of low-fat dairy each day. One serving is 1 cup (237 mL). 1 serving of nuts, seeds, or beans 5 times each week. 2-3 servings of heart-healthy fats. Healthy fats called omega-3 fatty acids are found in foods such as walnuts, flaxseeds, fortified milks, and eggs.  These fats are also found in cold-water fish, such as sardines, salmon, and mackerel. Limit how much you eat of: Canned or prepackaged foods. Food that is high in trans fat, such as some fried foods. Food that is high in saturated fat, such as fatty meat. Desserts and other sweets, sugary drinks, and other foods with added sugar. Full-fat dairy products. Do not salt foods before eating. Do not eat more than 4 egg yolks a week. Try to eat at least 2 vegetarian meals a week. Eat more home-cooked food and less restaurant, buffet, and fast food.  Lifestyle When eating at a restaurant, ask that your food be prepared with less salt or no salt, if possible. If you drink alcohol: Limit how much you use to: 0-1 drink a day for women who are not pregnant. 0-2 drinks a day for men. Be aware of how much alcohol is in your drink. In the [place] one drink equals one 12 oz bottle of beer (355 mL), one 5 oz glass of wine (148 mL), or one 1 oz glass of hard liquor (44 mL). General information Avoid eating more than 2,300 mg of salt a day. If you have hypertension, you may need to reduce your sodium intake to 1,500 mg a day. Work with your health care provider to maintain a healthy body weight or to lose weight. Ask what an ideal weight is for you. Get at least 30 minutes of exercise that causes your heart to beat faster (aerobic exercise) most days of the week. Activities may include walking, swimming, or biking. Work with your health care provider   or dietitian to adjust your eating plan to your individual calorie needs. What foods should I eat? Fruits All fresh, dried, or frozen fruit. Canned fruit in natural juice (without addedsugar). Vegetables Fresh or frozen vegetables (raw, steamed, roasted, or grilled). Low-sodium or reduced-sodium tomato and vegetable juice. Low-sodium or reduced-sodium tomatosauce and tomato paste. Low-sodium or reduced-sodium canned vegetables. Grains Whole-grain or  whole-wheat bread. Whole-grain or whole-wheat pasta. Brown rice. Oatmeal. Quinoa. Bulgur. Whole-grain and low-sodium cereals. Pita bread.Low-fat, low-sodium crackers. Whole-wheat flour tortillas. Meats and other proteins Skinless chicken or turkey. Ground chicken or turkey. Pork with fat trimmed off. Fish and seafood. Egg whites. Dried beans, peas, or lentils. Unsalted nuts, nut butters, and seeds. Unsalted canned beans. Lean cuts of beef with fat trimmed off. Low-sodium, lean precooked or cured meat, such as sausages or meatloaves. Dairy Low-fat (1%) or fat-free (skim) milk. Reduced-fat, low-fat, or fat-free cheeses. Nonfat, low-sodium ricotta or cottage cheese. Low-fat or nonfatyogurt. Low-fat, low-sodium cheese. Fats and oils Soft margarine without trans fats. Vegetable oil. Reduced-fat, low-fat, or light mayonnaise and salad dressings (reduced-sodium). Canola, safflower, olive, avocado, soybean, andsunflower oils. Avocado. Seasonings and condiments Herbs. Spices. Seasoning mixes without salt. Other foods Unsalted popcorn and pretzels. Fat-free sweets. The items listed above may not be a complete list of foods and beverages you can eat. Contact a dietitian for more information. What foods should I avoid? Fruits Canned fruit in a light or heavy syrup. Fried fruit. Fruit in cream or buttersauce. Vegetables Creamed or fried vegetables. Vegetables in a cheese sauce. Regular canned vegetables (not low-sodium or reduced-sodium). Regular canned tomato sauce and paste (not low-sodium or reduced-sodium). Regular tomato and vegetable juice(not low-sodium or reduced-sodium). Pickles. Olives. Grains Baked goods made with fat, such as croissants, muffins, or some breads. Drypasta or rice meal packs. Meats and other proteins Fatty cuts of meat. Ribs. Fried meat. Bacon. Bologna, salami, and other precooked or cured meats, such as sausages or meat loaves. Fat from the back of a pig (fatback). Bratwurst.  Salted nuts and seeds. Canned beans with added salt. Canned orsmoked fish. Whole eggs or egg yolks. Chicken or turkey with skin. Dairy Whole or 2% milk, cream, and half-and-half. Whole or full-fat cream cheese. Whole-fat or sweetened yogurt. Full-fat cheese. Nondairy creamers. Whippedtoppings. Processed cheese and cheese spreads. Fats and oils Butter. Stick margarine. Lard. Shortening. Ghee. Bacon fat. Tropical oils, suchas coconut, palm kernel, or palm oil. Seasonings and condiments Onion salt, garlic salt, seasoned salt, table salt, and sea salt. Worcestershire sauce. Tartar sauce. Barbecue sauce. Teriyaki sauce. Soy sauce, including reduced-sodium. Steak sauce. Canned and packaged gravies. Fish sauce. Oyster sauce. Cocktail sauce. Store-bought horseradish. Ketchup. Mustard. Meat flavorings and tenderizers. Bouillon cubes. Hot sauces. Pre-made or packaged marinades. Pre-made or packaged taco seasonings. Relishes. Regular saladdressings. Other foods Salted popcorn and pretzels. The items listed above may not be a complete list of foods and beverages you should avoid. Contact a dietitian for more information. Where to find more information National Heart, Lung, and Blood Institute: www.nhlbi.nih.gov American Heart Association: www.heart.org Academy of Nutrition and Dietetics: www.eatright.org National Kidney Foundation: www.kidney.org Summary The DASH eating plan is a healthy eating plan that has been shown to reduce high blood pressure (hypertension). It may also reduce your risk for type 2 diabetes, heart disease, and stroke. When on the DASH eating plan, aim to eat more fresh fruits and vegetables, whole grains, lean proteins, low-fat dairy, and heart-healthy fats. With the DASH eating plan, you should limit salt (sodium) intake to 2,300   mg a day. If you have hypertension, you may need to reduce your sodium intake to 1,500 mg a day. Work with your health care provider or dietitian to adjust  your eating plan to your individual calorie needs. This information is not intended to replace advice given to you by your health care provider. Make sure you discuss any questions you have with your healthcare provider. Document Revised: 02/14/2019 Document Reviewed: 02/14/2019 Elsevier Patient Education  2022 Elsevier Inc.  

## 2020-11-19 NOTE — Progress Notes (Signed)
Subjective:    Patient ID: Margaret Mcintosh, female    DOB: 1933-03-24, 87 y.o.   MRN: [id number]  HPI  Patient presents to clinic today for follow-up of chronic conditions.  HTN: Her BP today is 143/51.  She is taking Amlodipine, Labetalol and Losartan as prescribed.  ECG from 10/2015 reviewed.  HLD with History of stroke: Her last LDL was 50, triglycerides 66, 10/2019.  She denies myalgias on Simvastatin.  She tries to consume a low-fat diet.  GERD: Triggered by acidic foods.  She denies breakthrough on Pantoprazole.  There is no upper GI on file.  DM2: Her last A1c was 6.4, 05/2020.  She is taking Metformin and Gabapentin as prescribed.  She does not check her sugars.  She checks her feet routinely.  Her eye exam is scheduled for December.  Flu 11/2019.  Pneumovax 03/2013.  Prevnar 02/2016.  COVID Moderna x2.  Review of Systems     Past Medical History:  Diagnosis Date   Arthritis    Cataract    Complication of anesthesia    "takes a long time to wake me up" (11/04/2015)   Diabetes mellitus (HCC)    GERD (gastroesophageal reflux disease)    Glaucoma    Hypertension    Stroke (HCC)    last 8/17. Mild left arm/leg weakness   TIA (transient ischemic attack) 11/04/2015    Current Outpatient Medications  Medication Sig Dispense Refill   acetaminophen (TYLENOL) 325 MG tablet Take 325-650 mg by mouth every 6 (six) hours as needed for mild pain.      amLODipine (NORVASC) 5 MG tablet Take 1 tablet (5 mg total) by mouth daily. 90 tablet 3   aspirin 325 MG EC tablet Take 1 tablet by mouth daily.     Cholecalciferol (VITAMIN D3 PO) Take by mouth.     Elastic Bandages & Supports (MEDICAL COMPRESSION SOCKS) MISC 2 Devices by Does not apply route daily. 2 each 0   FREESTYLE LITE test strip 1 each by Other route daily. Use to test blood sugar up to 4 times daily. 90 each 4   gabapentin (NEURONTIN) 100 MG capsule Take 1 capsule (100 mg total) by mouth at bedtime. INCREASE to 1 capsule in am  and at bedtime after 2 weeks if needed. 60 capsule 1   labetalol (NORMODYNE) 100 MG tablet TAKE 1 TABLET BY MOUTH THREE TIMES DAILY 270 tablet 0   losartan (COZAAR) 50 MG tablet Take 2 tablets (100 mg total) by mouth daily. with food 270 tablet 0   metFORMIN (GLUCOPHAGE) 500 MG tablet Take 1 tablet (500 mg total) by mouth 2 (two) times daily with a meal. 180 tablet 0   Multiple Vitamin (MULTIVITAMIN) tablet Take 1 tablet by mouth daily.     Omega-3 Fatty Acids (FISH OIL) 1200 MG CAPS Take by mouth 3 (three) times daily.     pantoprazole (PROTONIX) 40 MG tablet TAKE 1 TABLET BY MOUTH ONCE DAILY AT  6  AM 90 tablet 0   simvastatin (ZOCOR) 20 MG tablet Take 1 tablet (20 mg total) by mouth at bedtime. 90 tablet 1   No current facility-administered medications for this visit.    Allergies  Allergen Reactions   Codeine Other (See Comments)    Gets nausea and feels like"something is crawling" "somethings crawling in my head"    Diphenhydramine Hcl Other (See Comments)    Feels like skin crawling "somethings crawling in my head"    Other Other (See Comments)  CANNOT HAVE ANY "SPICY" FOODS!!    Family History  Problem Relation Age of Onset   Hypertension Mother    Diabetes Mother    Heart disease Mother    Cancer Sister    Diabetes Brother    Hypertension Daughter    Other Son        Pituitary gland disease   Hypertension Son    Obesity Son    Colon cancer Brother    Diabetes Brother    Alzheimer's disease Brother    Diabetes Sister     Social History   Socioeconomic History   Marital status: Widowed    Spouse name: Not on file   Number of children: 2   Years of education: Not on file   Highest education level: Not on file  Occupational History   Occupation: Retired  Tobacco Use   Smoking status: Never   Smokeless tobacco: Never  Vaping Use   Vaping Use: Never used  Substance and Sexual Activity   Alcohol use: No   Drug use: No   Sexual activity: Not Currently   Other Topics Concern   Not on file  Social History Narrative   Not on file   Social Determinants of Health   Financial Resource Strain: Not on file  Food Insecurity: Not on file  Transportation Needs: Not on file  Physical Activity: Not on file  Stress: Not on file  Social Connections: Not on file  Intimate Partner Violence: Not on file     Constitutional: Denies fever, malaise, fatigue, headache or abrupt weight changes.  HEENT: Denies eye pain, eye redness, ear pain, ringing in the ears, wax buildup, runny nose, nasal congestion, bloody nose, or sore throat. Respiratory: Denies difficulty breathing, shortness of breath, cough or sputum production.   Cardiovascular: Denies chest pain, chest tightness, palpitations or swelling in the hands or feet.  Gastrointestinal: Denies abdominal pain, bloating, constipation, diarrhea or blood in the stool.  Musculoskeletal: Denies decrease in range of motion, difficulty with gait, muscle pain or joint pain and swelling.  Skin: Denies redness, rashes, lesions or ulcercations.  Neurological: Denies dizziness, difficulty with memory, difficulty with speech or problems with balance and coordination.    No other specific complaints in a complete review of systems (except as listed in HPI above).  Objective:   Physical Exam  BP (!) 143/51 (BP Location: Right Arm, Patient Position: Sitting, Cuff Size: Normal)   Pulse 66   Temp 97.7 F (36.5 C) (Temporal)   Resp 17   Ht 5\' 4"  (1.626 m)   Wt 172 lb 9.6 oz (78.3 kg)   SpO2 98%   BMI 29.63 kg/m   Wt Readings from Last 3 Encounters:  08/31/20 170 lb 12.8 oz (77.5 kg)  06/15/20 174 lb 9.6 oz (79.2 kg)  12/04/19 177 lb (80.3 kg)    General: Appears her stated age, overweight, in NAD. Skin: Warm, dry and intact. No ulcerations noted. HEENT: Head: normal shape and size; Eyes: sclera white and EOMs intact;  Cardiovascular: Normal rate and rhythm. S1,S2 noted.  No murmur, rubs or gallops  noted. No JVD or BLE edema. No carotid bruits noted. Pulmonary/Chest: Normal effort and positive vesicular breath sounds. No respiratory distress. No wheezes, rales or ronchi noted.  Musculoskeletal:  No difficulty with gait.  Neurological: Alert and oriented.  BMET    Component Value Date/Time   NA 140 06/15/2020 1055   NA 135 (L) 04/09/2013 1025   K 4.6 06/15/2020 1055  K 3.8 04/09/2013 1025   CL 103 06/15/2020 1055   CL 103 04/09/2013 1025   CO2 30 06/15/2020 1055   CO2 28 04/09/2013 1025   GLUCOSE 126 (H) 06/15/2020 1055   GLUCOSE 211 (H) 04/09/2013 1025   BUN 15 06/15/2020 1055   BUN 14 04/09/2013 1025   CREATININE 1.04 (H) 06/15/2020 1055   CALCIUM 10.1 06/15/2020 1055   CALCIUM 9.2 04/09/2013 1025   GFRNONAA 58 (L) 11/18/2019 1010   GFRAA 67 11/18/2019 1010    Lipid Panel     Component Value Date/Time   CHOL 121 11/18/2019 1010   TRIG 66 11/18/2019 1010   HDL 56 11/18/2019 1010   CHOLHDL 2.2 11/18/2019 1010   VLDL 23 11/05/2015 0549   LDLCALC 50 11/18/2019 1010    CBC    Component Value Date/Time   WBC 4.2 11/18/2019 1010   RBC 3.85 11/18/2019 1010   HGB 10.1 (L) 11/18/2019 1010   HGB 14.9 04/09/2013 1025   HCT 32.5 (L) 11/18/2019 1010   HCT 44.4 04/09/2013 1025   PLT 175 11/18/2019 1010   PLT 184 04/09/2013 1025   MCV 84.4 11/18/2019 1010   MCV 89 04/09/2013 1025   MCH 26.2 (L) 11/18/2019 1010   MCHC 31.1 (L) 11/18/2019 1010   RDW 14.0 11/18/2019 1010   RDW 13.5 04/09/2013 1025   LYMPHSABS 752 (L) 11/18/2019 1010   MONOABS 0.6 11/05/2015 0938   EOSABS 50 11/18/2019 1010   BASOSABS 29 11/18/2019 1010    Hgb A1C Lab Results  Component Value Date   HGBA1C 6.4 (H) 06/15/2020           Assessment & Plan:   Nicki Reaper, NP This visit occurred during the SARS-CoV-2 public health emergency.  Safety protocols were in place, including screening questions prior to the visit, additional usage of staff PPE, and extensive cleaning of exam room  while observing appropriate contact time as indicated for disinfecting solutions.

## 2020-11-19 NOTE — Assessment & Plan Note (Signed)
Encourage diet and exercise for weight loss 

## 2020-11-19 NOTE — Assessment & Plan Note (Signed)
A1c today No urine microalbumin secondary to ARB therapy Encouraged her to consume a low-carb diet Continue Metformin and Gabapentin Encourage routine eye exam, this is scheduled Foot exam today Encouraged her to get a flu shot in fall Pneumonia vaccines UTD Encouraged her to get her COVID booster

## 2020-11-19 NOTE — Assessment & Plan Note (Signed)
C-Met and lipid profile today Encouraged her to consume a low-fat diet Continue Simvastatin, will adjust if needed based on labs 

## 2020-11-19 NOTE — Assessment & Plan Note (Signed)
Controlled on Amlodipine, Labetalol and Losartan Reinforced DASH diet C-Met today

## 2020-11-20 LAB — COMPLETE METABOLIC PANEL WITH GFR
AG Ratio: 1.8 (calc) (ref 1.0–2.5)
ALT: 10 U/L (ref 6–29)
AST: 12 U/L (ref 10–35)
Albumin: 4.3 g/dL (ref 3.6–5.1)
Alkaline phosphatase (APISO): 49 U/L (ref 37–153)
BUN: 19 mg/dL (ref 7–25)
CO2: 28 mmol/L (ref 20–32)
Calcium: 9.8 mg/dL (ref 8.6–10.4)
Chloride: 104 mmol/L (ref 98–110)
Creat: 0.93 mg/dL (ref 0.60–0.95)
Globulin: 2.4 g/dL (calc) (ref 1.9–3.7)
Glucose, Bld: 142 mg/dL — ABNORMAL HIGH (ref 65–139)
Potassium: 5.3 mmol/L (ref 3.5–5.3)
Sodium: 140 mmol/L (ref 135–146)
Total Bilirubin: 0.5 mg/dL (ref 0.2–1.2)
Total Protein: 6.7 g/dL (ref 6.1–8.1)
eGFR: 60 mL/min/{1.73_m2} (ref >=60)

## 2020-11-20 LAB — LIPID PANEL
Cholesterol: 123 mg/dL (ref <200)
HDL: 52 mg/dL (ref >=50)
LDL Cholesterol (Calc): 51 mg/dL (calc)
Non-HDL Cholesterol (Calc): 71 mg/dL (calc) (ref <130)
Total CHOL/HDL Ratio: 2.4 (calc) (ref <5.0)
Triglycerides: 110 mg/dL (ref <150)

## 2020-11-20 LAB — HEMOGLOBIN A1C
Hgb A1c MFr Bld: 5.9 % of total Hgb — ABNORMAL HIGH (ref <5.7)
Mean Plasma Glucose: 123 mg/dL
eAG (mmol/L): 6.8 mmol/L

## 2020-11-22 ENCOUNTER — Emergency Department: Payer: PPO

## 2020-11-22 ENCOUNTER — Emergency Department
Admission: EM | Admit: 2020-11-22 | Discharge: 2020-11-22 | Disposition: A | Discharge status: Home / Self Care | Payer: PPO | Attending: Student in an Organized Health Care Education/Training Program | Admitting: Student in an Organized Health Care Education/Training Program

## 2020-11-22 ENCOUNTER — Telehealth: Payer: Self-pay | Admitting: Internal Medicine

## 2020-11-22 DIAGNOSIS — Z20822 Contact with and (suspected) exposure to covid-19: Secondary | ICD-10-CM | POA: Insufficient documentation

## 2020-11-22 DIAGNOSIS — Z7984 Long term (current) use of oral hypoglycemic drugs: Secondary | ICD-10-CM | POA: Insufficient documentation

## 2020-11-22 DIAGNOSIS — Z79899 Other long term (current) drug therapy: Secondary | ICD-10-CM | POA: Insufficient documentation

## 2020-11-22 DIAGNOSIS — R42 Dizziness and giddiness: Secondary | ICD-10-CM

## 2020-11-22 DIAGNOSIS — E119 Type 2 diabetes mellitus without complications: Secondary | ICD-10-CM | POA: Diagnosis not present

## 2020-11-22 DIAGNOSIS — I959 Hypotension, unspecified: Secondary | ICD-10-CM | POA: Diagnosis not present

## 2020-11-22 DIAGNOSIS — Z7982 Long term (current) use of aspirin: Secondary | ICD-10-CM | POA: Insufficient documentation

## 2020-11-22 DIAGNOSIS — W1811XA Fall from or off toilet without subsequent striking against object, initial encounter: Secondary | ICD-10-CM | POA: Insufficient documentation

## 2020-11-22 DIAGNOSIS — G319 Degenerative disease of nervous system, unspecified: Secondary | ICD-10-CM | POA: Diagnosis not present

## 2020-11-22 DIAGNOSIS — I10 Essential (primary) hypertension: Secondary | ICD-10-CM

## 2020-11-22 DIAGNOSIS — R29818 Other symptoms and signs involving the nervous system: Secondary | ICD-10-CM | POA: Diagnosis not present

## 2020-11-22 DIAGNOSIS — R2681 Unsteadiness on feet: Secondary | ICD-10-CM | POA: Diagnosis not present

## 2020-11-22 DIAGNOSIS — R531 Weakness: Secondary | ICD-10-CM | POA: Diagnosis not present

## 2020-11-22 DIAGNOSIS — I639 Cerebral infarction, unspecified: Secondary | ICD-10-CM | POA: Diagnosis not present

## 2020-11-22 DIAGNOSIS — W19XXXA Unspecified fall, initial encounter: Secondary | ICD-10-CM

## 2020-11-22 LAB — TROPONIN I (HIGH SENSITIVITY)
Troponin I (High Sensitivity): 5 ng/L (ref <18)
Troponin I (High Sensitivity): 6 ng/L (ref <18)

## 2020-11-22 LAB — CBC WITH DIFFERENTIAL/PLATELET
Abs Immature Granulocytes: 0.02 10*3/uL (ref 0.00–0.07)
Basophils Absolute: 0 10*3/uL (ref 0.0–0.1)
Basophils Relative: 1 %
Eosinophils Absolute: 0.1 10*3/uL (ref 0.0–0.5)
Eosinophils Relative: 4 %
HCT: 35.3 % — ABNORMAL LOW (ref 36.0–46.0)
Hemoglobin: 11.7 g/dL — ABNORMAL LOW (ref 12.0–15.0)
Immature Granulocytes: 1 %
Lymphocytes Relative: 26 %
Lymphs Abs: 1 10*3/uL (ref 0.7–4.0)
MCH: 30.5 pg (ref 26.0–34.0)
MCHC: 33.1 g/dL (ref 30.0–36.0)
MCV: 92.2 fL (ref 80.0–100.0)
Monocytes Absolute: 0.4 10*3/uL (ref 0.1–1.0)
Monocytes Relative: 10 %
Neutro Abs: 2.2 10*3/uL (ref 1.7–7.7)
Neutrophils Relative %: 58 %
Platelets: 158 10*3/uL (ref 150–400)
RBC: 3.83 MIL/uL — ABNORMAL LOW (ref 3.87–5.11)
RDW: 13.6 % (ref 11.5–15.5)
WBC: 3.7 10*3/uL — ABNORMAL LOW (ref 4.0–10.5)
nRBC: 0 % (ref 0.0–0.2)

## 2020-11-22 LAB — COMPREHENSIVE METABOLIC PANEL
ALT: 14 U/L (ref 0–44)
AST: 22 U/L (ref 15–41)
Albumin: 4.3 g/dL (ref 3.5–5.0)
Alkaline Phosphatase: 53 U/L (ref 38–126)
Anion gap: 7 (ref 5–15)
BUN: 17 mg/dL (ref 8–23)
CO2: 26 mmol/L (ref 22–32)
Calcium: 9.5 mg/dL (ref 8.9–10.3)
Chloride: 104 mmol/L (ref 98–111)
Creatinine, Ser: 0.96 mg/dL (ref 0.44–1.00)
GFR, Estimated: 58 mL/min — ABNORMAL LOW (ref >60)
Glucose, Bld: 140 mg/dL — ABNORMAL HIGH (ref 70–99)
Potassium: 4.3 mmol/L (ref 3.5–5.1)
Sodium: 137 mmol/L (ref 135–145)
Total Bilirubin: 0.8 mg/dL (ref 0.3–1.2)
Total Protein: 7.4 g/dL (ref 6.5–8.1)

## 2020-11-22 LAB — URINALYSIS, COMPLETE (UACMP) WITH MICROSCOPIC
Bilirubin Urine: NEGATIVE
Glucose, UA: NEGATIVE mg/dL
Hgb urine dipstick: NEGATIVE
Ketones, ur: NEGATIVE mg/dL
Nitrite: NEGATIVE
Protein, ur: NEGATIVE mg/dL
Specific Gravity, Urine: 1.004 — ABNORMAL LOW (ref 1.005–1.030)
pH: 6 (ref 5.0–8.0)

## 2020-11-22 LAB — RESP PANEL BY RT-PCR (FLU A&B, COVID) ARPGX2
Influenza A by PCR: NEGATIVE
Influenza B by PCR: NEGATIVE
SARS Coronavirus 2 by RT PCR: NEGATIVE

## 2020-11-22 MED ORDER — LABETALOL HCL 5 MG/ML IV SOLN
10.0000 mg | Freq: Once | INTRAVENOUS | Status: AC
  Administered 2020-11-22: 10 mg via INTRAVENOUS
  Filled 2020-11-22: qty 4

## 2020-11-22 MED ORDER — PROBIOTIC 250 MG PO CAPS: 1.0000 | ORAL_CAPSULE | Freq: Two times a day (BID) | ORAL | 0 refills | Status: DC

## 2020-11-22 MED ORDER — CEPHALEXIN 500 MG PO CAPS: 500.0000 mg | ORAL_CAPSULE | Freq: Three times a day (TID) | ORAL | 0 refills | Status: AC

## 2020-11-22 MED ORDER — LOSARTAN POTASSIUM 50 MG PO TABS
50.0000 mg | ORAL_TABLET | Freq: Once | ORAL | Status: AC
  Administered 2020-11-22: 50 mg via ORAL
  Filled 2020-11-22: qty 1

## 2020-11-22 MED ORDER — ASPIRIN 81 MG PO CHEW
324.0000 mg | CHEWABLE_TABLET | Freq: Once | ORAL | Status: AC
  Administered 2020-11-22: 324 mg via ORAL
  Filled 2020-11-22: qty 4

## 2020-11-22 NOTE — Telephone Encounter (Signed)
Patient called back to review request for clarification of cozaar 50 mg . Medication profile shows cozaar 50mg  take 1 tab (50 mg total) by mouth in the morning, at noon, and at bedtime with food. Patient seen in ED today for fall. Patient back home now. Confused of pharmacy instructions to take cozaar 2 tab (100 mg ) by mouth daily with food. Patient reports ED requesting MD to review medication dose and how to take. Please advise.

## 2020-11-22 NOTE — ED Triage Notes (Signed)
Pt to ED via [facility] from home. Pt c/o dizziness and unsteady gait. Pt fell off of toilet this morning but denies hitting her head or LOC.  VSS except BP 177/73. CBG 130. A&Ox4. Pt hx HTN and stroke.

## 2020-11-22 NOTE — ED Provider Notes (Signed)
[facility] Emergency Department Provider Note    Event Date/Time   First MD Initiated Contact with Patient 11/22/20 0827     (approximate)  I have reviewed the triage vital signs and the nursing notes.   HISTORY  Chief Complaint Fall and Dizziness    HPI Margaret Mcintosh is a 87 y.o. female with the below listed past medical history presents to the ER for evaluation of "dizziness "as well as lightheadedness that she woke up with symptoms.  States that she went to the bathroom and felt weak enough that she slid off the toilet.  Cannot recall whether she hit her head or not.  No LOC.  Denies any numbness or tingling at this time.  Denies any dysuria.  No fevers no cough or congestion.  No chest pain.  Past Medical History:  Diagnosis Date   Arthritis    Cataract    Complication of anesthesia    "takes a long time to wake me up" (11/04/2015)   Diabetes mellitus (HCC)    GERD (gastroesophageal reflux disease)    Glaucoma    Hypertension    Stroke (HCC)    last 8/17. Mild left arm/leg weakness   TIA (transient ischemic attack) 11/04/2015   Family History  Problem Relation Age of Onset   Hypertension Mother    Diabetes Mother    Heart disease Mother    Cancer Sister    Diabetes Brother    Hypertension Daughter    Other Son        Pituitary gland disease   Hypertension Son    Obesity Son    Colon cancer Brother    Diabetes Brother    Alzheimer's disease Brother    Diabetes Sister    Past Surgical History:  Procedure Laterality Date   ANKLE FRACTURE SURGERY Left 2000   "screw is still there" (11/04/2015)   APPENDECTOMY  ~1960   BROW LIFT Bilateral 06/12/2017   Procedure: BLEPHAROPLASTY UPPER EYELID WITH EXCESS SKIN DIABETIC;  Surgeon: Imagene RichesFowler, Amy M, MD;  [facility];  Service: Ophthalmology;  Laterality: Bilateral;  Diabetic - oral meds   CATARACT EXTRACTION W/PHACO Right 02/08/2016   Procedure: CATARACT EXTRACTION PHACO AND  INTRAOCULAR LENS PLACEMENT (IOC);  Surgeon: Nevada CraneBradley Mark King, MD;  [facility];  Service: Ophthalmology;  Laterality: Right;  DIABETIC - oral meds RIGHT   CATARACT EXTRACTION W/PHACO Left 03/07/2016   Procedure: CATARACT EXTRACTION PHACO AND INTRAOCULAR LENS PLACEMENT (IOC);  Surgeon: Nevada CraneBradley Mark King, MD;  Location[facility];  Service: Ophthalmology;  Laterality: Left;  DIABETIC - oral meds LEFT   FRACTURE SURGERY     VAGINAL HYSTERECTOMY  1973   Patient Active Problem List   Diagnosis Date Noted   Overweight with body mass index (BMI) of 29 to 29.9 in adult 11/19/2020   GERD (gastroesophageal reflux disease) 07/08/2018   Essential hypertension 07/08/2018   Hyperlipemia 07/17/2016   Diabetes (HCC) 11/04/2015   History of stroke with current residual effects       Prior to Admission medications   Medication Sig Start Date End Date Taking? Authorizing Provider  cephALEXin (KEFLEX) 500 MG capsule Take 1 capsule (500 mg total) by mouth 3 (three) times daily for 5 days. 11/22/20 11/27/20 Yes Willy Eddyobinson, Jaleiyah Alas, MD  Saccharomyces boulardii (PROBIOTIC) 250 MG CAPS Take 1 capsule by mouth in the morning and at bedtime. 11/22/20  Yes Willy Eddyobinson, Faron Tudisco, MD  acetaminophen (TYLENOL) 325 MG tablet Take 325-650 mg  by mouth every 6 (six) hours as needed for mild pain.     [provider]  amLODipine (NORVASC) 5 MG tablet Take 1 tablet (5 mg total) by mouth daily. 06/15/20   Gabriel Cirri, NP  aspirin 325 MG EC tablet Take 1 tablet by mouth daily. 08/07/16   [provider]  Cholecalciferol (VITAMIN D3 PO) Take by mouth.    [provider]  Elastic Bandages & Supports (MEDICAL COMPRESSION SOCKS) MISC 2 Devices by Does not apply route daily. 04/01/18   Galen Manila, NP  FREESTYLE LITE test strip 1 each by Other route daily. Use to test blood sugar up to 4 times daily. 02/03/20   Reubin Milan, MD  gabapentin (NEURONTIN) 100 MG capsule Take 1  capsule (100 mg total) by mouth at bedtime. INCREASE to 1 capsule in am and at bedtime after 2 weeks if needed. 06/15/20   Gabriel Cirri, NP  losartan (COZAAR) 50 MG tablet Take 1 tablet (50 mg total) by mouth in the morning, at noon, and at bedtime. with food 11/19/20   Lorre Munroe, NP  metFORMIN (GLUCOPHAGE) 500 MG tablet Take 1 tablet (500 mg total) by mouth 2 (two) times daily with a meal. 10/19/20   Lorre Munroe, NP  Multiple Vitamin (MULTIVITAMIN) tablet Take 1 tablet by mouth daily.    [provider]  Omega-3 Fatty Acids (FISH OIL) 1200 MG CAPS Take by mouth 3 (three) times daily.    [provider]  pantoprazole (PROTONIX) 40 MG tablet TAKE 1 TABLET BY MOUTH ONCE DAILY AT  6  AM 10/25/20   Lorre Munroe, NP  simvastatin (ZOCOR) 20 MG tablet Take 1 tablet (20 mg total) by mouth at bedtime. 07/21/20   Lorre Munroe, NP    Allergies Codeine, Diphenhydramine hcl, and Other    Social History Social History   Tobacco Use   Smoking status: Never   Smokeless tobacco: Never  Vaping Use   Vaping Use: Never used  Substance Use Topics   Alcohol use: No   Drug use: No    Review of Systems Patient denies headaches, rhinorrhea, blurry vision, numbness, shortness of breath, chest pain, edema, cough, abdominal pain, nausea, vomiting, diarrhea, dysuria, fevers, rashes or hallucinations unless otherwise stated above in HPI. ____________________________________________   PHYSICAL EXAM:  VITAL SIGNS: Vitals:   11/22/20 1300 11/22/20 1400  BP: (!) 193/81 (!) 197/68  Pulse: 76 70  Resp: (!) 26 15  Temp:    SpO2: 98% 97%    Constitutional: Alert and oriented.  Eyes: Conjunctivae are normal.  Head: Atraumatic. Nose: No congestion/rhinnorhea. Mouth/Throat: Mucous membranes are moist.   Neck: No stridor. Painless ROM.  Cardiovascular: Normal rate, regular rhythm. Grossly normal heart sounds.  Good peripheral circulation. Respiratory: Normal respiratory effort.   No retractions. Lungs CTAB. Gastrointestinal: Soft and nontender. No distention. No abdominal bruits. No CVA tenderness. Genitourinary:  Musculoskeletal: No lower extremity tenderness nor edema.  No joint effusions. Neurologic:  Normal speech and language. No gross focal neurologic deficits are appreciated. No facial droop Skin:  Skin is warm, dry and intact. No rash noted. Psychiatric: Mood and affect are normal. Speech and behavior are normal.  ____________________________________________   LABS (all labs ordered are listed, but only abnormal results are displayed)  Results for orders placed or performed during the hospital encounter of 11/22/20 (from the past 24 hour(s))  CBC with Differential     Status: Abnormal   Collection Time: 11/22/20  8:27 AM  Result Value Ref Range   WBC 3.7 (L) 4.0 - 10.5 K/uL   RBC 3.83 (L) 3.87 - 5.11 MIL/uL   Hemoglobin 11.7 (L) 12.0 - 15.0 g/dL   HCT 54.6 (L) 50.3 - 54.6 %   MCV 92.2 80.0 - 100.0 fL   MCH 30.5 26.0 - 34.0 pg   MCHC 33.1 30.0 - 36.0 g/dL   RDW 56.8 12.7 - 51.7 %   Platelets 158 150 - 400 K/uL   nRBC 0.0 0.0 - 0.2 %   Neutrophils Relative % 58 %   Neutro Abs 2.2 1.7 - 7.7 K/uL   Lymphocytes Relative 26 %   Lymphs Abs 1.0 0.7 - 4.0 K/uL   Monocytes Relative 10 %   Monocytes Absolute 0.4 0.1 - 1.0 K/uL   Eosinophils Relative 4 %   Eosinophils Absolute 0.1 0.0 - 0.5 K/uL   Basophils Relative 1 %   Basophils Absolute 0.0 0.0 - 0.1 K/uL   Immature Granulocytes 1 %   Abs Immature Granulocytes 0.02 0.00 - 0.07 K/uL  Comprehensive metabolic panel     Status: Abnormal   Collection Time: 11/22/20  8:27 AM  Result Value Ref Range   Sodium 137 135 - 145 mmol/L   Potassium 4.3 3.5 - 5.1 mmol/L   Chloride 104 98 - 111 mmol/L   CO2 26 22 - 32 mmol/L   Glucose, Bld 140 (H) 70 - 99 mg/dL   BUN 17 8 - 23 mg/dL   Creatinine, Ser 0.01 0.44 - 1.00 mg/dL   Calcium 9.5 8.9 - 74.9 mg/dL   Total Protein 7.4 6.5 - 8.1 g/dL   Albumin 4.3 3.5 -  5.0 g/dL   AST 22 15 - 41 U/L   ALT 14 0 - 44 U/L   Alkaline Phosphatase 53 38 - 126 U/L   Total Bilirubin 0.8 0.3 - 1.2 mg/dL   GFR, Estimated 58 (L) >60 mL/min   Anion gap 7 5 - 15  Troponin I (High Sensitivity)     Status: None   Collection Time: 11/22/20  8:27 AM  Result Value Ref Range   Troponin I (High Sensitivity) 5 <18 ng/L  Urinalysis, Complete w Microscopic     Status: Abnormal   Collection Time: 11/22/20  8:27 AM  Result Value Ref Range   Color, Urine STRAW (A) YELLOW   APPearance CLEAR (A) CLEAR   Specific Gravity, Urine 1.004 (L) 1.005 - 1.030   pH 6.0 5.0 - 8.0   Glucose, UA NEGATIVE NEGATIVE mg/dL   Hgb urine dipstick NEGATIVE NEGATIVE   Bilirubin Urine NEGATIVE NEGATIVE   Ketones, ur NEGATIVE NEGATIVE mg/dL   Protein, ur NEGATIVE NEGATIVE mg/dL   Nitrite NEGATIVE NEGATIVE   Leukocytes,Ua LARGE (A) NEGATIVE   RBC / HPF 0-5 0 - 5 RBC/hpf   WBC, UA 21-50 0 - 5 WBC/hpf   Bacteria, UA RARE (A) NONE SEEN   Squamous Epithelial / LPF 0-5 0 - 5   Mucus PRESENT    Non Squamous Epithelial PRESENT (A) NONE SEEN  Resp Panel by RT-PCR (Flu A&B, Covid)     Status: None   Collection Time: 11/22/20  8:27 AM   Specimen: Nasopharyngeal(NP) swabs in vial transport medium  Result Value Ref Range   SARS Coronavirus 2 by RT PCR NEGATIVE NEGATIVE   Influenza A by PCR NEGATIVE NEGATIVE   Influenza B by PCR NEGATIVE NEGATIVE  Troponin I (High Sensitivity)     Status: None   Collection  Time: 11/22/20 10:33 AM  Result Value Ref Range   Troponin I (High Sensitivity) 6 <18 ng/L   ____________________________________________  EKG My review and personal interpretation at Time: 8:21   Indication: weakness  Rate: 55  Rhythm: sinus Axis: normal Other: normal intervals, no stemi ____________________________________________  RADIOLOGY  I personally reviewed all radiographic images ordered to evaluate for the above acute complaints and reviewed radiology reports and findings.  These  findings were personally discussed with the patient.  Please see medical record for radiology report.  ____________________________________________   PROCEDURES  Procedure(s) performed:  Procedures    Critical Care performed: no ____________________________________________   INITIAL IMPRESSION / ASSESSMENT AND PLAN / ED COURSE  Pertinent labs & imaging results that were available during my care of the patient were reviewed by me and considered in my medical decision making (see chart for details).   DDX: cva, tia, hypoglycemia, dehydration, electrolyte abnormality, sepsis   Margaret Mcintosh is a 87 y.o. who presents to the ED with symptoms as described above.  No focal neurodeficit.  Blood work sent for by differential was reassuring.  CT imaging without acute abnormality.  Given her age risk factor MRI was ordered to evaluate for possible CVA MRI without any evidence of stroke.  She is hypertensive but did not take her home antihypertensive medications this morning.  She denies any dysuria flank pain is not septic does have bacteria on her urine will cover for UTI.  She is able to ambulate.  Discussed her presentation work-up and results with the patient's son at her request.  No additional questions or concerns relayed.  Does appear stable appropriate for outpatient follow-up     The patient was evaluated in Emergency Department today for the symptoms described in the history of present illness. He/she was evaluated in the context of the global COVID-19 pandemic, which necessitated consideration that the patient might be at risk for infection with the SARS-CoV-2 virus that causes COVID-19. Institutional protocols and algorithms that pertain to the evaluation of patients at risk for COVID-19 are in a state of rapid change based on information released by regulatory bodies including the CDC and federal and state organizations. These policies and algorithms were followed during the  patient's care in the ED.  As part of my medical decision making, I reviewed the following data within the electronic MEDICAL RECORD NUMBER Nursing notes reviewed and incorporated, Labs reviewed, notes from prior ED visits and [place] Controlled Substance Database   ____________________________________________   FINAL CLINICAL IMPRESSION(S) / ED DIAGNOSES  Final diagnoses:  Fall, initial encounter  Lightheadedness  Hypertension, unspecified type      NEW MEDICATIONS STARTED DURING THIS VISIT:  New Prescriptions   CEPHALEXIN (KEFLEX) 500 MG CAPSULE    Take 1 capsule (500 mg total) by mouth 3 (three) times daily for 5 days.   SACCHAROMYCES BOULARDII (PROBIOTIC) 250 MG CAPS    Take 1 capsule by mouth in the morning and at bedtime.     Note:  This document was prepared using Dragon voice recognition software and may include unintentional dictation errors.    Willy Eddy, MD 11/22/20 [phone]

## 2020-11-22 NOTE — ED Notes (Signed)
Patient transported to CT 

## 2020-11-22 NOTE — Discharge Instructions (Addendum)
Please follow up in clinic to discuss  your blood pressure medications.  Return for any additional questions or concerns.

## 2020-11-22 NOTE — ED Notes (Signed)
Pt dressed into a gown. Pt belongings placed into belongings bag.

## 2020-11-22 NOTE — ED Notes (Signed)
Pt assisted to bathroom

## 2020-11-22 NOTE — Telephone Encounter (Signed)
Patients son called in about confusion with dosage on medicine, losartan (COZAAR) 50 MG tablet , says was told at 1 point to take one a day,now at pharmacy says 3 a day. Please call back

## 2020-11-25 ENCOUNTER — Ambulatory Visit (INDEPENDENT_AMBULATORY_CARE_PROVIDER_SITE_OTHER): Payer: PPO | Admitting: Internal Medicine

## 2020-11-25 ENCOUNTER — Encounter: Payer: Self-pay | Admitting: Internal Medicine

## 2020-11-25 ENCOUNTER — Other Ambulatory Visit: Payer: Self-pay

## 2020-11-25 VITALS — BP 166/53 | HR 61 | Temp 97.5°F | Resp 17 | Ht 64.0 in | Wt 172.0 lb

## 2020-11-25 DIAGNOSIS — Z6829 Body mass index (BMI) 29.0-29.9, adult: Secondary | ICD-10-CM | POA: Diagnosis not present

## 2020-11-25 DIAGNOSIS — E663 Overweight: Secondary | ICD-10-CM | POA: Diagnosis not present

## 2020-11-25 DIAGNOSIS — I951 Orthostatic hypotension: Secondary | ICD-10-CM

## 2020-11-25 DIAGNOSIS — R42 Dizziness and giddiness: Secondary | ICD-10-CM

## 2020-11-25 DIAGNOSIS — N3000 Acute cystitis without hematuria: Secondary | ICD-10-CM | POA: Diagnosis not present

## 2020-11-25 DIAGNOSIS — I10 Essential (primary) hypertension: Secondary | ICD-10-CM

## 2020-11-25 MED ORDER — LOSARTAN POTASSIUM 50 MG PO TABS: 50.0000 mg | ORAL_TABLET | Freq: Every day | ORAL | 2 refills | Status: DC

## 2020-11-25 MED ORDER — LABETALOL HCL 100 MG PO TABS: 100.0000 mg | ORAL_TABLET | Freq: Three times a day (TID) | ORAL | 0 refills | Status: DC

## 2020-11-25 NOTE — Progress Notes (Signed)
Subjective:    Patient ID: Margaret Mcintosh, female    DOB: [birth year], 87 y.o.   MRN: [id number]  HPI  Pt presents to the clinic today for ER follow up. She went to the ER 8/29 with c/o fall. She reports she had been feeling dizzy/lightheaded and slid off the commode. She is unsure if she hit her head or lost consciousness. Labs showed and very mild anemia and concern for a UTI. ECG was unchanged. CT head and MRI brain were negative for acute findings. She was hypertensive in the ER but had not taken her blood pressure medications that morning. She was discharged with Cephlexin for UTI and advised to follow up with her PCP. Since discharge,she reports she has been feeling less dizzy. She is dizzy if she changes positions rapidly. She denies recurrent fall. She is taking Amlodipine, Labetalol and Losartan as prescribed. Her BP today is 166/53.  Review of Systems     Past Medical History:  Diagnosis Date   Arthritis    Cataract    Complication of anesthesia    "takes a long time to wake me up" (11/04/2015)   Diabetes mellitus (HCC)    GERD (gastroesophageal reflux disease)    Glaucoma    Hypertension    Stroke (HCC)    last 8/17. Mild left arm/leg weakness   TIA (transient ischemic attack) 11/04/2015    Current Outpatient Medications  Medication Sig Dispense Refill   acetaminophen (TYLENOL) 325 MG tablet Take 325-650 mg by mouth every 6 (six) hours as needed for mild pain.      amLODipine (NORVASC) 5 MG tablet Take 1 tablet (5 mg total) by mouth daily. 90 tablet 3   aspirin 325 MG EC tablet Take 1 tablet by mouth daily.     cephALEXin (KEFLEX) 500 MG capsule Take 1 capsule (500 mg total) by mouth 3 (three) times daily for 5 days. 15 capsule 0   Cholecalciferol (VITAMIN D3 PO) Take by mouth.     Elastic Bandages & Supports (MEDICAL COMPRESSION SOCKS) MISC 2 Devices by Does not apply route daily. 2 each 0   FREESTYLE LITE test strip 1 each by Other route daily. Use to test blood sugar  up to 4 times daily. 90 each 4   gabapentin (NEURONTIN) 100 MG capsule Take 1 capsule (100 mg total) by mouth at bedtime. INCREASE to 1 capsule in am and at bedtime after 2 weeks if needed. 60 capsule 1   losartan (COZAAR) 50 MG tablet Take 1 tablet (50 mg total) by mouth daily. with food 90 tablet 2   metFORMIN (GLUCOPHAGE) 500 MG tablet Take 1 tablet (500 mg total) by mouth 2 (two) times daily with a meal. 180 tablet 0   Multiple Vitamin (MULTIVITAMIN) tablet Take 1 tablet by mouth daily.     Omega-3 Fatty Acids (FISH OIL) 1200 MG CAPS Take by mouth 3 (three) times daily.     pantoprazole (PROTONIX) 40 MG tablet TAKE 1 TABLET BY MOUTH ONCE DAILY AT  6  AM 90 tablet 0   Saccharomyces boulardii (PROBIOTIC) 250 MG CAPS Take 1 capsule by mouth in the morning and at bedtime. 30 capsule 0   simvastatin (ZOCOR) 20 MG tablet Take 1 tablet (20 mg total) by mouth at bedtime. 90 tablet 1   No current facility-administered medications for this visit.    Allergies  Allergen Reactions   Codeine Other (See Comments)    Gets nausea and feels like"something is crawling" "somethings crawling  in my head"    Diphenhydramine Hcl Other (See Comments)    Feels like skin crawling "somethings crawling in my head"    Other Other (See Comments)    CANNOT HAVE ANY "SPICY" FOODS!!    Family History  Problem Relation Age of Onset   Hypertension Mother    Diabetes Mother    Heart disease Mother    Cancer Sister    Diabetes Brother    Hypertension Daughter    Other Son        Pituitary gland disease   Hypertension Son    Obesity Son    Colon cancer Brother    Diabetes Brother    Alzheimer's disease Brother    Diabetes Sister     Social History   Socioeconomic History   Marital status: Widowed    Spouse name: Not on file   Number of children: 2   Years of education: Not on file   Highest education level: Not on file  Occupational History   Occupation: Retired  Tobacco Use   Smoking status:  Never   Smokeless tobacco: Never  Vaping Use   Vaping Use: Never used  Substance and Sexual Activity   Alcohol use: No   Drug use: No   Sexual activity: Not Currently  Other Topics Concern   Not on file  Social History Narrative   Not on file   Social Determinants of Health   Financial Resource Strain: Not on file  Food Insecurity: Not on file  Transportation Needs: Not on file  Physical Activity: Not on file  Stress: Not on file  Social Connections: Not on file  Intimate Partner Violence: Not on file     Constitutional: Denies fever, malaise, fatigue, headache or abrupt weight changes.  HEENT: Denies eye pain, eye redness, ear pain, ringing in the ears, wax buildup, runny nose, nasal congestion, bloody nose, or sore throat. Respiratory: Denies difficulty breathing, shortness of breath, cough or sputum production.   Cardiovascular: Pt reports intermittent swelling in her legs. Denies chest pain, chest tightness, palpitations or swelling in the hands.  Musculoskeletal: Denies decrease in range of motion, difficulty with gait, muscle pain or joint pain and swelling.  Skin: Denies redness, rashes, lesions or ulcercations.  Neurological: Pt reports intermittent dizziness. Denies dizziness, difficulty with memory, difficulty with speech or problems with balance and coordination.    No other specific complaints in a complete review of systems (except as listed in HPI above).  Objective:   Physical Exam  BP (!) 166/53 (BP Location: Right Arm, Patient Position: Sitting, Cuff Size: Large)   Pulse 61   Temp (!) 97.5 F (36.4 C) (Temporal)   Resp 17   Ht 5\' 4"  (1.626 m)   Wt 172 lb (78 kg)   SpO2 100%   BMI 29.52 kg/m   Wt Readings from Last 3 Encounters:  11/19/20 172 lb 9.6 oz (78.3 kg)  08/31/20 170 lb 12.8 oz (77.5 kg)  06/15/20 174 lb 9.6 oz (79.2 kg)    General: Appears her stated age, overweight, in NAD. Skin: Warm, dry and intact.  HEENT: Head: normal shape and  size; Eyes: PERRLA and EOMs intact;  Cardiovascular: Normal rate and rhythm. S1,S2 noted.  No murmur, rubs or gallops noted. No JVD or BLE edema. No carotid bruits noted. Pulmonary/Chest: Normal effort and positive vesicular breath sounds. No respiratory distress. No wheezes, rales or ronchi noted.  Musculoskeletal: Gait slow and steady with use of cane. Neurological: Alert and oriented.  BMET    Component Value Date/Time   NA 137 11/22/2020 0827   NA 135 (L) 04/09/2013 1025   K 4.3 11/22/2020 0827   K 3.8 04/09/2013 1025   CL 104 11/22/2020 0827   CL 103 04/09/2013 1025   CO2 26 11/22/2020 0827   CO2 28 04/09/2013 1025   GLUCOSE 140 (H) 11/22/2020 0827   GLUCOSE 211 (H) 04/09/2013 1025   BUN 17 11/22/2020 0827   BUN 14 04/09/2013 1025   CREATININE 0.96 11/22/2020 0827   CREATININE 0.93 11/19/2020 1523   CALCIUM 9.5 11/22/2020 0827   CALCIUM 9.2 04/09/2013 1025   GFRNONAA 58 (L) 11/22/2020 0827   GFRNONAA 58 (L) 11/18/2019 1010   GFRAA 67 11/18/2019 1010    Lipid Panel     Component Value Date/Time   CHOL 123 11/19/2020 1523   TRIG 110 11/19/2020 1523   HDL 52 11/19/2020 1523   CHOLHDL 2.4 11/19/2020 1523   VLDL 23 11/05/2015 0549   LDLCALC 51 11/19/2020 1523    CBC    Component Value Date/Time   WBC 3.7 (L) 11/22/2020 0827   RBC 3.83 (L) 11/22/2020 0827   HGB 11.7 (L) 11/22/2020 0827   HGB 14.9 04/09/2013 1025   HCT 35.3 (L) 11/22/2020 0827   HCT 44.4 04/09/2013 1025   PLT 158 11/22/2020 0827   PLT 184 04/09/2013 1025   MCV 92.2 11/22/2020 0827   MCV 89 04/09/2013 1025   MCH 30.5 11/22/2020 0827   MCHC 33.1 11/22/2020 0827   RDW 13.6 11/22/2020 0827   RDW 13.5 04/09/2013 1025   LYMPHSABS 1.0 11/22/2020 0827   MONOABS 0.4 11/22/2020 0827   EOSABS 0.1 11/22/2020 0827   BASOSABS 0.0 11/22/2020 0827    Hgb A1C Lab Results  Component Value Date   HGBA1C 5.9 (H) 11/19/2020            Assessment & Plan:   ER Follow Up for Fall secondary to  Dizziness, HTN, UTI:  ER notes, labs and imaging reviewed She will continue Cephlexin until course is finished Push fluids Continue Losartan and Labetalol as prescribed Will increase Amlodipine to 10 mg daily She is orthostatic today- encouraged her to change positions slowly Reinforced DASH diet  RTC in 2 weeks for follow up HTN/orthostasis Nicki Reaper, NP This visit occurred during the SARS-CoV-2 public health emergency.  Safety protocols were in place, including screening questions prior to the visit, additional usage of staff PPE, and extensive cleaning of exam room while observing appropriate contact time as indicated for disinfecting solutions.

## 2020-11-25 NOTE — Telephone Encounter (Signed)
She should have only been taking 50 mg daily

## 2020-11-25 NOTE — Telephone Encounter (Signed)
The pt was notified that she should only be taking Losartan 50MG  1 tablet daily .

## 2020-11-25 NOTE — Addendum Note (Signed)
Addended by: Lonna Cobb on: 11/25/2020 09:22 AM   Modules accepted: Orders

## 2020-11-25 NOTE — Patient Instructions (Signed)
Orthostatic Hypotension Blood pressure is a measurement of how strongly, or weakly, your circulating blood is pressing against the walls of your arteries. Orthostatic hypotension is a drop in blood pressure that can happen when you change positions, such as when you go from lying down to standing. Arteries are blood vessels that carry blood from your heart throughout your body. When blood pressure is too low, you may not get enough blood to your brain or to the rest of your organs. Orthostatic hypotension can cause light-headedness, sweating, rapid heartbeat, blurred vision, and fainting. These symptoms require further investigation into the cause. What are the causes? Orthostatic hypotension can be caused by many things, including: Sudden changes in posture, such as standing up quickly after you have been sitting or lying down. Loss of blood (anemia) or loss of body fluids (dehydration). Heart problems, neurologic problems, or hormone problems. Pregnancy. Aging. The risk for this condition increases as you get older. Severe infection (sepsis). Certain medicines, such as medicines for high blood pressure or medicines that make the body lose excess fluids (diuretics). What are the signs or symptoms? Symptoms of this condition may include: Weakness, light-headedness, or dizziness. Sweating. Blurred vision. Tiredness (fatigue). Rapid heartbeat. Fainting, in severe cases. How is this diagnosed? This condition is diagnosed based on: Your symptoms and medical history. Your blood pressure measurements. Your health care provider will check your blood pressure when you are: Lying down. Sitting. Standing. A blood pressure reading is recorded as two numbers, such as "120 over 80" (or 120/80). The first ("top") number is called the systolic pressure. It is a measure of the pressure in your arteries as your heart beats. The second ("bottom") number is called the diastolic pressure. It is a measure of  the pressure in your arteries when your heart relaxes between beats. Blood pressure is measured in a unit called mmHg. Healthy blood pressure for most adults is 120/80 mmHg. Orthostatic hypotension is defined as a 20 mmHg drop in systolic pressure or a 10 mmHg drop in diastolic pressure within 3 minutes of standing. Other information or tests that may be used to diagnose orthostatic hypotension include: Your other vital signs, such as your heart rate and temperature. Blood tests. An electrocardiogram (ECG) or echocardiogram. A Holter monitor. This is a device you wear that records your heart rhythm continuously, usually for 24-48 hours. Tilt table test. For this test, you will be safely secured to a table that moves you from a lying position to an upright position. Your heart rhythm and blood pressure will be monitored during the test. How is this treated? This condition may be treated by: Changing your diet. This may involve eating more salt (sodium) or drinking more water. Changing the dosage of certain medicines you are taking that might be lowering your blood pressure. Correcting the underlying reason for the orthostatic hypotension. Wearing compression stockings. Taking medicines to raise your blood pressure. Avoiding actions that trigger symptoms. Follow these instructions at home: Medicines Take over-the-counter and prescription medicines only as told by your health care provider. Follow instructions from your health care provider about changing the dosage of your current medicines, if this applies. Do not stop or adjust any of your medicines on your own. Eating and drinking  Drink enough fluid to keep your urine pale yellow. Eat extra salt only as directed. Do not add extra salt to your diet unless advised by your health care provider. Eat frequent, small meals. Avoid standing up suddenly after eating. General instructions    Get up slowly from lying down or sitting positions. This  gives your blood pressure a chance to adjust. Avoid hot showers and excessive heat as directed by your health care provider. Engage in regular physical activity as directed by your health care provider. If you have compression stockings, wear them as told. Keep all follow-up visits. This is important. Contact a health care provider if: You have a fever for more than 2-3 days. You feel more thirsty than usual. You feel dizzy or weak. Get help right away if: You have chest pain. You have a fast or irregular heartbeat. You become sweaty or feel light-headed. You feel short of breath. You faint. You have any symptoms of a stroke. "BE FAST" is an easy way to remember the main warning signs of a stroke: B - Balance. Signs are dizziness, sudden trouble walking, or loss of balance. E - Eyes. Signs are trouble seeing or a sudden change in vision. F - Face. Signs are sudden weakness or numbness of the face, or the face or eyelid drooping on one side. A - Arms. Signs are weakness or numbness in an arm. This happens suddenly and usually on one side of the body. S - Speech. Signs are sudden trouble speaking, slurred speech, or trouble understanding what people say. T - Time. Time to call emergency services. Write down what time symptoms started. You have other signs of a stroke, such as: A sudden, severe headache with no known cause. Nausea or vomiting. Seizure. These symptoms may represent a serious problem that is an emergency. Do not wait to see if the symptoms will go away. Get medical help right away. Call your local emergency services (911 in the [place] Do not drive yourself to the hospital. Summary Orthostatic hypotension is a sudden drop in blood pressure. It can cause light-headedness, sweating, rapid heartbeat, blurred vision, and fainting. Orthostatic hypotension can be diagnosed by having your blood pressure taken while lying down, sitting, and then standing. Treatment may involve  changing your diet, wearing compression stockings, sitting up slowly, adjusting your medicines, or correcting the underlying reason for the orthostatic hypotension. Get help right away if you have chest pain, a fast or irregular heartbeat, or symptoms of a stroke. This information is not intended to replace advice given to you by your health care provider. Make sure you discuss any questions you have with your health care provider. Document Revised: 05/27/2020 Document Reviewed: 05/27/2020 Elsevier Patient Education  2022 Elsevier Inc.  

## 2020-12-09 ENCOUNTER — Ambulatory Visit: Payer: PPO | Admitting: Podiatry

## 2020-12-09 ENCOUNTER — Ambulatory Visit (INDEPENDENT_AMBULATORY_CARE_PROVIDER_SITE_OTHER): Payer: PPO | Admitting: Internal Medicine

## 2020-12-09 ENCOUNTER — Other Ambulatory Visit: Payer: Self-pay

## 2020-12-09 ENCOUNTER — Encounter: Payer: Self-pay | Admitting: Internal Medicine

## 2020-12-09 ENCOUNTER — Encounter: Payer: Self-pay | Admitting: Podiatry

## 2020-12-09 VITALS — BP 152/58 | HR 55 | Temp 97.5°F | Resp 17 | Ht 64.0 in | Wt 170.6 lb

## 2020-12-09 DIAGNOSIS — I951 Orthostatic hypotension: Secondary | ICD-10-CM | POA: Diagnosis not present

## 2020-12-09 DIAGNOSIS — I10 Essential (primary) hypertension: Secondary | ICD-10-CM | POA: Diagnosis not present

## 2020-12-09 DIAGNOSIS — E663 Overweight: Secondary | ICD-10-CM

## 2020-12-09 DIAGNOSIS — Z23 Encounter for immunization: Secondary | ICD-10-CM | POA: Diagnosis not present

## 2020-12-09 DIAGNOSIS — B351 Tinea unguium: Secondary | ICD-10-CM | POA: Diagnosis not present

## 2020-12-09 DIAGNOSIS — M79675 Pain in left toe(s): Secondary | ICD-10-CM | POA: Diagnosis not present

## 2020-12-09 DIAGNOSIS — Z6829 Body mass index (BMI) 29.0-29.9, adult: Secondary | ICD-10-CM

## 2020-12-09 DIAGNOSIS — M79674 Pain in right toe(s): Secondary | ICD-10-CM

## 2020-12-09 DIAGNOSIS — E1142 Type 2 diabetes mellitus with diabetic polyneuropathy: Secondary | ICD-10-CM

## 2020-12-09 NOTE — Patient Instructions (Signed)

## 2020-12-09 NOTE — Assessment & Plan Note (Signed)
She remains orthostatic but is not symptomatic Encouraged her to change positions slowly Encourage adequate water intake Will monitor

## 2020-12-09 NOTE — Progress Notes (Signed)
This patient returns to my office for at risk foot care.  This patient requires this care by a professional since this patient will be at risk due to having diabetes.  This patient is unable to cut nails herself since the patient cannot reach her nails.These nails are painful walking and wearing shoes. Patient returns for diabetic foot exam. This patient presents for at risk foot care today.  General Appearance  Alert, conversant and in no acute stress.  Vascular  Dorsalis pedis and posterior tibial  pulses are palpable  bilaterally.  Capillary return is within normal limits  bilaterally. Temperature is within normal limits  bilaterally.  Neurologic  Senn-Weinstein monofilament wire test within normal limits  bilaterally. Muscle power within normal limits bilaterally.  Nails Thick disfigured discolored nails with subungual debris  hallux nails bilaterally. No evidence of bacterial infection or drainage bilaterally.  Orthopedic  No limitations of motion  feet .  No crepitus or effusions noted.  No bony pathology or digital deformities noted. HAV  B/L.  Skin  normotropic skin with no porokeratosis noted bilaterally.  No signs of infections or ulcers noted.     Onychomycosis  Pain in right toes  Pain in left toes  Consent was obtained for treatment procedures.   Mechanical debridement of nails 1-5  bilaterally performed with a nail nipper.  Filed with dremel without incident. Diabetic foot exam performed.    Return office visit     3 months.                  Told patient to return for periodic foot care and evaluation due to potential at risk complications.   Helane Gunther DPM

## 2020-12-09 NOTE — Progress Notes (Signed)
Subjective:    Patient ID: Margaret Mcintosh, female    DOB: 1932/05/31, 87 y.o.   MRN: [id number]  HPI  Patient presents the clinic today for follow-up of HTN and orthostasis.  At her last visit her Losartan was decreased to 50 mg daily and she continued on her Amlodipine 5 mg daily and Labetalol 100 mg 3 times a day. she has been taking these medications as prescribed.  ECG from 10/2020 reviewed.  Review of Systems     Past Medical History:  Diagnosis Date   Arthritis    Cataract    Complication of anesthesia    "takes a long time to wake me up" (11/04/2015)   Diabetes mellitus (HCC)    GERD (gastroesophageal reflux disease)    Glaucoma    Hypertension    Stroke (HCC)    last 8/17. Mild left arm/leg weakness   TIA (transient ischemic attack) 11/04/2015    Current Outpatient Medications  Medication Sig Dispense Refill   acetaminophen (TYLENOL) 325 MG tablet Take 325-650 mg by mouth every 6 (six) hours as needed for mild pain.      amLODipine (NORVASC) 5 MG tablet Take 2 tablets (10 mg total) by mouth daily. 90 tablet 3   aspirin 325 MG EC tablet Take 1 tablet by mouth daily.     Cholecalciferol (VITAMIN D3 PO) Take by mouth.     Elastic Bandages & Supports (MEDICAL COMPRESSION SOCKS) MISC 2 Devices by Does not apply route daily. 2 each 0   FREESTYLE LITE test strip 1 each by Other route daily. Use to test blood sugar up to 4 times daily. 90 each 4   gabapentin (NEURONTIN) 100 MG capsule Take 1 capsule (100 mg total) by mouth at bedtime. INCREASE to 1 capsule in am and at bedtime after 2 weeks if needed. 60 capsule 1   labetalol (NORMODYNE) 100 MG tablet Take 1 tablet (100 mg total) by mouth 3 (three) times daily. 90 tablet 0   losartan (COZAAR) 50 MG tablet Take 1 tablet (50 mg total) by mouth daily. with food 90 tablet 2   metFORMIN (GLUCOPHAGE) 500 MG tablet Take 1 tablet (500 mg total) by mouth 2 (two) times daily with a meal. 180 tablet 0   Multiple Vitamin (MULTIVITAMIN)  tablet Take 1 tablet by mouth daily.     Omega-3 Fatty Acids (FISH OIL) 1200 MG CAPS Take by mouth 3 (three) times daily.     pantoprazole (PROTONIX) 40 MG tablet TAKE 1 TABLET BY MOUTH ONCE DAILY AT  6  AM 90 tablet 0   simvastatin (ZOCOR) 20 MG tablet Take 1 tablet (20 mg total) by mouth at bedtime. 90 tablet 1   No current facility-administered medications for this visit.    Allergies  Allergen Reactions   Codeine Other (See Comments)    Gets nausea and feels like"something is crawling" "somethings crawling in my head"    Diphenhydramine Hcl Other (See Comments)    Feels like skin crawling "somethings crawling in my head"    Other Other (See Comments)    CANNOT HAVE ANY "SPICY" FOODS!!    Family History  Problem Relation Age of Onset   Hypertension Mother    Diabetes Mother    Heart disease Mother    Cancer Sister    Diabetes Brother    Hypertension Daughter    Other Son        Pituitary gland disease   Hypertension Son    Obesity Son  Colon cancer Brother    Diabetes Brother    Alzheimer's disease Brother    Diabetes Sister     Social History   Socioeconomic History   Marital status: Widowed    Spouse name: Not on file   Number of children: 2   Years of education: Not on file   Highest education level: Not on file  Occupational History   Occupation: Retired  Tobacco Use   Smoking status: Never   Smokeless tobacco: Never  Vaping Use   Vaping Use: Never used  Substance and Sexual Activity   Alcohol use: No   Drug use: No   Sexual activity: Not Currently  Other Topics Concern   Not on file  Social History Narrative   Not on file   Social Determinants of Health   Financial Resource Strain: Not on file  Food Insecurity: Not on file  Transportation Needs: Not on file  Physical Activity: Not on file  Stress: Not on file  Social Connections: Not on file  Intimate Partner Violence: Not on file     Constitutional: Denies fever, malaise, fatigue,  headache or abrupt weight changes.  Respiratory: Denies difficulty breathing, shortness of breath, cough or sputum production.   Cardiovascular: Denies chest pain, chest tightness, palpitations or swelling in the hands or feet.  Neurological: Denies dizziness, difficulty with memory, difficulty with speech or problems with balance and coordination.    No other specific complaints in a complete review of systems (except as listed in HPI above).  Objective:   Physical Exam   BP (!) 152/58 (BP Location: Left Arm, Patient Position: Supine, Cuff Size: Normal)   Pulse (!) 55   Temp (!) 97.5 F (36.4 C) (Temporal)   Resp 17   Ht 5\' 4"  (1.626 m)   Wt 170 lb 9.6 oz (77.4 kg)   SpO2 100%   BMI 29.28 kg/m   Wt Readings from Last 3 Encounters:  11/25/20 172 lb (78 kg)  11/19/20 172 lb 9.6 oz (78.3 kg)  08/31/20 170 lb 12.8 oz (77.5 kg)    General: Appears her stated age, overweight, in NAD. HEENT: Head: normal shape and size; Eyes: sclera white and EOMs intact;  Cardiovascular: Normal rate and rhythm. S1,S2 noted.  No murmur, rubs or gallops noted. No JVD or BLE edema.  Pulmonary/Chest: Normal effort and positive vesicular breath sounds. No respiratory distress. No wheezes, rales or ronchi noted.  ses noted. Liver, spleen and kidneys non palpable. Musculoskeletal: No difficulty with gait.  Neurological: Alert and oriented.   BMET    Component Value Date/Time   NA 137 11/22/2020 0827   NA 135 (L) 04/09/2013 1025   K 4.3 11/22/2020 0827   K 3.8 04/09/2013 1025   CL 104 11/22/2020 0827   CL 103 04/09/2013 1025   CO2 26 11/22/2020 0827   CO2 28 04/09/2013 1025   GLUCOSE 140 (H) 11/22/2020 0827   GLUCOSE 211 (H) 04/09/2013 1025   BUN 17 11/22/2020 0827   BUN 14 04/09/2013 1025   CREATININE 0.96 11/22/2020 0827   CREATININE 0.93 11/19/2020 1523   CALCIUM 9.5 11/22/2020 0827   CALCIUM 9.2 04/09/2013 1025   GFRNONAA 58 (L) 11/22/2020 0827   GFRNONAA 58 (L) 11/18/2019 1010    GFRAA 67 11/18/2019 1010    Lipid Panel     Component Value Date/Time   CHOL 123 11/19/2020 1523   TRIG 110 11/19/2020 1523   HDL 52 11/19/2020 1523   CHOLHDL 2.4 11/19/2020 1523  VLDL 23 11/05/2015 0549   LDLCALC 51 11/19/2020 1523    CBC    Component Value Date/Time   WBC 3.7 (L) 11/22/2020 0827   RBC 3.83 (L) 11/22/2020 0827   HGB 11.7 (L) 11/22/2020 0827   HGB 14.9 04/09/2013 1025   HCT 35.3 (L) 11/22/2020 0827   HCT 44.4 04/09/2013 1025   PLT 158 11/22/2020 0827   PLT 184 04/09/2013 1025   MCV 92.2 11/22/2020 0827   MCV 89 04/09/2013 1025   MCH 30.5 11/22/2020 0827   MCHC 33.1 11/22/2020 0827   RDW 13.6 11/22/2020 0827   RDW 13.5 04/09/2013 1025   LYMPHSABS 1.0 11/22/2020 0827   MONOABS 0.4 11/22/2020 0827   EOSABS 0.1 11/22/2020 0827   BASOSABS 0.0 11/22/2020 0827    Hgb A1C Lab Results  Component Value Date   HGBA1C 5.9 (H) 11/19/2020          Assessment & Plan:    Nicki Reaper, NP This visit occurred during the SARS-CoV-2 public health emergency.  Safety protocols were in place, including screening questions prior to the visit, additional usage of staff PPE, and extensive cleaning of exam room while observing appropriate contact time as indicated for disinfecting solutions.

## 2020-12-09 NOTE — Assessment & Plan Note (Signed)
Encourage diet and exercise for weight loss 

## 2020-12-09 NOTE — Assessment & Plan Note (Signed)
Better control with Losartan 50 mg Amlodipine 5 mg and Labetalol 100 mg Reinforced DASH diet Will monitor

## 2020-12-16 ENCOUNTER — Ambulatory Visit: Payer: PPO | Admitting: Internal Medicine

## 2020-12-28 ENCOUNTER — Other Ambulatory Visit: Payer: Self-pay | Admitting: Internal Medicine

## 2021-01-18 ENCOUNTER — Other Ambulatory Visit: Payer: Self-pay | Admitting: Internal Medicine

## 2021-01-18 DIAGNOSIS — E119 Type 2 diabetes mellitus without complications: Secondary | ICD-10-CM

## 2021-01-18 MED ORDER — METFORMIN HCL 500 MG PO TABS: 500.0000 mg | ORAL_TABLET | Freq: Two times a day (BID) | ORAL | 1 refills | Status: DC

## 2021-01-18 NOTE — Telephone Encounter (Signed)
Medication Refill - Medication: Metformin  Has the patient contacted their pharmacy? Yes.   Pharmacy calling and needing to have prescription sent to them due to pt using them for the first time. Please advise.  (Agent: If no, request that the patient contact the pharmacy for the refill. If patient does not wish to contact the pharmacy document the reason why and proceed with request.) (Agent: If yes, when and what did the pharmacy advise?)  Preferred Pharmacy (with phone number or street name):  [facility] [address], [place] - [address]  [address] [place] [address]  Phone: [phone] Fax: [phone]  Hours: Not open 24 hours   Has the patient been seen for an appointment in the last year OR does the patient have an upcoming appointment? Yes.    Agent: Please be advised that RX refills may take up to 3 business days. We ask that you follow-up with your pharmacy.

## 2021-01-18 NOTE — Telephone Encounter (Signed)
Copied from CRM [id number]. Topic: Quick Communication - Rx Refill/Question >> Jan 18, 2021  2:40 PM Marylen Ponto wrote: Medication: metFORMIN (GLUCOPHAGE) 500 MG tablet  Has the patient contacted their pharmacy? Yes.   (Agent: If no, request that the patient contact the pharmacy for the refill. If patient does not wish to contact the pharmacy document the reason why and proceed with request.) (Agent: If yes, when and what did the pharmacy advise?)  Preferred Pharmacy (with phone number or street name): [facility] [address], [place] - [address]  Phone: [phone]   Fax: [phone]  Has the patient been seen for an appointment in the last year OR does the patient have an upcoming appointment? Yes.    Agent: Please be advised that RX refills may take up to 3 business days. We ask that you follow-up with your pharmacy.

## 2021-01-18 NOTE — Telephone Encounter (Signed)
Requested Prescriptions  Pending Prescriptions Disp Refills  . metFORMIN (GLUCOPHAGE) 500 MG tablet 180 tablet 1    Sig: Take 1 tablet (500 mg total) by mouth 2 (two) times daily with a meal.     Endocrinology:  Diabetes - Biguanides Failed - 01/18/2021  4:00 PM      Failed - eGFR in normal range and within 360 days    GFR, Est African American  Date Value Ref Range Status  11/18/2019 67 > OR = 60 mL/min/1.45m Final   GFR, Est Non African American  Date Value Ref Range Status  11/18/2019 58 (L) > OR = 60 mL/min/1.782mFinal   GFR, Estimated  Date Value Ref Range Status  11/22/2020 58 (L) >60 mL/min Final    Comment:    (NOTE) Calculated using the CKD-EPI Creatinine Equation (2021)    eGFR  Date Value Ref Range Status  11/19/2020 60 > OR = 60 mL/min/1.7351minal    Comment:    The eGFR is based on the CKD-EPI 2021 equation. To calculate  the new eGFR from a previous Creatinine or Cystatin C result, go to https://www.kidney.org/professionals/ kdoqi/gfr%5Fcalculator          Passed - Cr in normal range and within 360 days    Creat  Date Value Ref Range Status  11/19/2020 0.93 0.60 - 0.95 mg/dL Final   Creatinine, Ser  Date Value Ref Range Status  11/22/2020 0.96 0.44 - 1.00 mg/dL Final         Passed - HBA1C is between 0 and 7.9 and within 180 days    Hgb A1c MFr Bld  Date Value Ref Range Status  11/19/2020 5.9 (H) <5.7 % of total Hgb Final    Comment:    For someone without known diabetes, a hemoglobin  A1c value between 5.7% and 6.4% is consistent with prediabetes and should be confirmed with a  follow-up test. . For someone with known diabetes, a value <7% indicates that their diabetes is well controlled. A1c targets should be individualized based on duration of diabetes, age, comorbid conditions, and other considerations. . This assay result is consistent with an increased risk of diabetes. . Currently, no consensus exists regarding use of hemoglobin  A1c for diagnosis of diabetes for children. .  Renella CunasValid encounter within last 6 months    Recent Outpatient Visits          1 month ago Essential hypertension   [facility]   [facility]   2 months ago Type 2 diabetes mellitus with hyperglycemia, without long-term current use of insulin [facility]   4 months ago Chronic right hip pain   [facility]   7 months ago Essential hypertension   [facility]      Future Appointments            In 1 month Baity, [facility]

## 2021-01-24 ENCOUNTER — Other Ambulatory Visit: Payer: Self-pay | Admitting: Unknown Physician Specialty

## 2021-01-24 DIAGNOSIS — E782 Mixed hyperlipidemia: Secondary | ICD-10-CM

## 2021-01-24 NOTE — Telephone Encounter (Signed)
Requested Prescriptions  Pending Prescriptions Disp Refills  . simvastatin (ZOCOR) 20 MG tablet [Pharmacy Med Name: Simvastatin 20 MG Oral Tablet] 90 tablet 1    Sig: TAKE 1 TABLET BY MOUTH AT BEDTIME     Cardiovascular:  Antilipid - Statins Passed - 01/24/2021  9:29 AM      Passed - Total Cholesterol in normal range and within 360 days    Cholesterol  Date Value Ref Range Status  11/19/2020 123 <200 mg/dL Final         Passed - LDL in normal range and within 360 days    LDL Cholesterol (Calc)  Date Value Ref Range Status  11/19/2020 51 mg/dL (calc) Final    Comment:    Reference range: <100 . Desirable range <100 mg/dL for primary prevention;   <70 mg/dL for patients with CHD or diabetic patients  with > or = 2 CHD risk factors. Marland Kitchen LDL-C is now calculated using the Martin-Hopkins  calculation, which is a validated novel method providing  better accuracy than the Friedewald equation in the  estimation of LDL-C.  Horald Pollen et al. Lenox Ahr. 3419;622(29): 2061-2068  (http://education.[employer].com/faq/FAQ164)          Passed - HDL in normal range and within 360 days    HDL  Date Value Ref Range Status  11/19/2020 52 > OR = 50 mg/dL Final         Passed - Triglycerides in normal range and within 360 days    Triglycerides  Date Value Ref Range Status  11/19/2020 110 <150 mg/dL Final         Passed - Patient is not pregnant      Passed - Valid encounter within last 12 months    Recent Outpatient Visits          1 month ago Essential hypertension   [facility] [place], Salvadore Oxford, NP   2 months ago Orthostasis   [facility] [place], [place] W, NP   2 months ago Type 2 diabetes mellitus with hyperglycemia, without long-term current use of insulin [facility])   [facility] [place], Salvadore Oxford, NP   4 months ago Chronic right hip pain   [facility] [place], Salvadore Oxford, NP   7 months ago Essential hypertension   [facility] Gabriel Cirri, NP      Future Appointments            In 1 month Baity, Salvadore Oxford, NP [facility], [facility]

## 2021-01-26 ENCOUNTER — Other Ambulatory Visit: Payer: Self-pay

## 2021-01-31 ENCOUNTER — Other Ambulatory Visit: Payer: Self-pay | Admitting: Internal Medicine

## 2021-01-31 NOTE — Telephone Encounter (Signed)
Requested Prescriptions  Pending Prescriptions Disp Refills  . labetalol (NORMODYNE) 100 MG tablet [Pharmacy Med Name: Labetalol HCl 100 MG Oral Tablet] 90 tablet 2    Sig: TAKE 1 TABLET BY MOUTH THREE TIMES DAILY     Cardiovascular:  Beta Blockers Failed - 01/31/2021  9:20 AM      Failed - Last BP in normal range    BP Readings from Last 1 Encounters:  12/09/20 (!) 152/58         Passed - Last Heart Rate in normal range    Pulse Readings from Last 1 Encounters:  12/09/20 (!) 55         Passed - Valid encounter within last 6 months    Recent Outpatient Visits          1 month ago Essential hypertension   [facility] [place], Salvadore Oxford, NP   2 months ago Orthostasis   [facility] [place], [place], NP   2 months ago Type 2 diabetes mellitus with hyperglycemia, without long-term current use of insulin [facility])   [facility] [place], Salvadore Oxford, NP   5 months ago Chronic right hip pain   [facility] [place], Salvadore Oxford, NP   7 months ago Essential hypertension   [facility] Gabriel Cirri, NP      Future Appointments            In 3 weeks Sam[place] Si, Salvadore Oxford, NP [facility], [facility] [place]

## 2021-02-03 ENCOUNTER — Other Ambulatory Visit: Payer: Self-pay | Admitting: Internal Medicine

## 2021-02-03 DIAGNOSIS — K219 Gastro-esophageal reflux disease without esophagitis: Secondary | ICD-10-CM

## 2021-02-03 NOTE — Telephone Encounter (Signed)
Requested Prescriptions  Pending Prescriptions Disp Refills  . pantoprazole (PROTONIX) 40 MG tablet [Pharmacy Med Name: Pantoprazole Sodium 40 MG Oral Tablet Delayed Release] 90 tablet 0    Sig: TAKE 1 TABLET BY MOUTH ONCE DAILY AT 6 AM     Gastroenterology: Proton Pump Inhibitors Passed - 02/03/2021  3:11 PM      Passed - Valid encounter within last 12 months    Recent Outpatient Visits          1 month ago Essential hypertension   [facility] [place], Salvadore Oxford, NP   2 months ago Orthostasis   [facility] Shea Sch[place], [place] W, NP   2 months ago Type 2 diabetes mellitus with hyperglycemia, without long-term current use of insulin [facility])   [facility] [place], Salvadore Oxford, NP   5 months ago Chronic right hip pain   [facility] [place], Salvadore Oxford, NP   7 months ago Essential hypertension   [facility] Gabriel Cirri, NP      Future Appointments            In 3 weeks Sampson Si, Salvadore Oxford, NP [facility], [facility]

## 2021-02-24 ENCOUNTER — Ambulatory Visit (INDEPENDENT_AMBULATORY_CARE_PROVIDER_SITE_OTHER): Payer: PPO | Admitting: Internal Medicine

## 2021-02-24 ENCOUNTER — Encounter: Payer: Self-pay | Admitting: Internal Medicine

## 2021-02-24 ENCOUNTER — Other Ambulatory Visit: Payer: Self-pay

## 2021-02-24 VITALS — BP 137/54 | HR 60 | Temp 98.4°F | Resp 17 | Ht 64.0 in | Wt 173.0 lb

## 2021-02-24 DIAGNOSIS — N1831 Chronic kidney disease, stage 3a: Secondary | ICD-10-CM | POA: Diagnosis not present

## 2021-02-24 DIAGNOSIS — Z6829 Body mass index (BMI) 29.0-29.9, adult: Secondary | ICD-10-CM

## 2021-02-24 DIAGNOSIS — D649 Anemia, unspecified: Secondary | ICD-10-CM | POA: Insufficient documentation

## 2021-02-24 DIAGNOSIS — Z0001 Encounter for general adult medical examination with abnormal findings: Secondary | ICD-10-CM | POA: Diagnosis not present

## 2021-02-24 DIAGNOSIS — E663 Overweight: Secondary | ICD-10-CM

## 2021-02-24 DIAGNOSIS — D709 Neutropenia, unspecified: Secondary | ICD-10-CM | POA: Diagnosis not present

## 2021-02-24 DIAGNOSIS — D631 Anemia in chronic kidney disease: Secondary | ICD-10-CM | POA: Diagnosis not present

## 2021-02-24 DIAGNOSIS — H353132 Nonexudative age-related macular degeneration, bilateral, intermediate dry stage: Secondary | ICD-10-CM | POA: Diagnosis not present

## 2021-02-24 DIAGNOSIS — N183 Chronic kidney disease, stage 3 unspecified: Secondary | ICD-10-CM | POA: Insufficient documentation

## 2021-02-24 DIAGNOSIS — E1165 Type 2 diabetes mellitus with hyperglycemia: Secondary | ICD-10-CM

## 2021-02-24 LAB — HM DIABETES EYE EXAM

## 2021-02-24 NOTE — Assessment & Plan Note (Signed)
CBC today.  

## 2021-02-24 NOTE — Assessment & Plan Note (Signed)
Encourage diet and exercise for weight loss 

## 2021-02-24 NOTE — Assessment & Plan Note (Signed)
C-Met today 

## 2021-02-24 NOTE — Patient Instructions (Signed)
Health Maintenance for Postmenopausal Women ?Menopause is a normal process in which your ability to get pregnant comes to an end. This process happens slowly over many months or years, usually between the ages of 48 and 55. Menopause is complete when you have missed your menstrual period for 12 months. ?It is important to talk with your health care provider about some of the most common conditions that affect women after menopause (postmenopausal women). These include heart disease, cancer, and bone loss (osteoporosis). Adopting a healthy lifestyle and getting preventive care can help to promote your health and wellness. The actions you take can also lower your chances of developing some of these common conditions. ?What are the signs and symptoms of menopause? ?During menopause, you may have the following symptoms: ?Hot flashes. These can be moderate or severe. ?Night sweats. ?Decrease in sex drive. ?Mood swings. ?Headaches. ?Tiredness (fatigue). ?Irritability. ?Memory problems. ?Problems falling asleep or staying asleep. ?Talk with your health care provider about treatment options for your symptoms. ?Do I need hormone replacement therapy? ?Hormone replacement therapy is effective in treating symptoms that are caused by menopause, such as hot flashes and night sweats. ?Hormone replacement carries certain risks, especially as you become older. If you are thinking about using estrogen or estrogen with progestin, discuss the benefits and risks with your health care provider. ?How can I reduce my risk for heart disease and stroke? ?The risk of heart disease, heart attack, and stroke increases as you age. One of the causes may be a change in the body's hormones during menopause. This can affect how your body uses dietary fats, triglycerides, and cholesterol. Heart attack and stroke are medical emergencies. There are many things that you can do to help prevent heart disease and stroke. ?Watch your blood pressure ?High  blood pressure causes heart disease and increases the risk of stroke. This is more likely to develop in people who have high blood pressure readings or are overweight. ?Have your blood pressure checked: ?Every 3-5 years if you are 18-39 years of age. ?Every year if you are 40 years old or older. ?Eat a healthy diet ? ?Eat a diet that includes plenty of vegetables, fruits, low-fat dairy products, and lean protein. ?Do not eat a lot of foods that are high in solid fats, added sugars, or sodium. ?Get regular exercise ?Get regular exercise. This is one of the most important things you can do for your health. Most adults should: ?Try to exercise for at least 150 minutes each week. The exercise should increase your heart rate and make you sweat (moderate-intensity exercise). ?Try to do strengthening exercises at least twice each week. Do these in addition to the moderate-intensity exercise. ?Spend less time sitting. Even light physical activity can be beneficial. ?Other tips ?Work with your health care provider to achieve or maintain a healthy weight. ?Do not use any products that contain nicotine or tobacco. These products include cigarettes, chewing tobacco, and vaping devices, such as e-cigarettes. If you need help quitting, ask your health care provider. ?Know your numbers. Ask your health care provider to check your cholesterol and your blood sugar (glucose). Continue to have your blood tested as directed by your health care provider. ?Do I need screening for cancer? ?Depending on your health history and family history, you may need to have cancer screenings at different stages of your life. This may include screening for: ?Breast cancer. ?Cervical cancer. ?Lung cancer. ?Colorectal cancer. ?What is my risk for osteoporosis? ?After menopause, you may be   at increased risk for osteoporosis. Osteoporosis is a condition in which bone destruction happens more quickly than new bone creation. To help prevent osteoporosis or  the bone fractures that can happen because of osteoporosis, you may take the following actions: ?If you are 19-50 years old, get at least 1,000 mg of calcium and at least 600 international units (IU) of vitamin D per day. ?If you are older than age 50 but younger than age 70, get at least 1,200 mg of calcium and at least 600 international units (IU) of vitamin D per day. ?If you are older than age 70, get at least 1,200 mg of calcium and at least 800 international units (IU) of vitamin D per day. ?Smoking and drinking excessive alcohol increase the risk of osteoporosis. Eat foods that are rich in calcium and vitamin D, and do weight-bearing exercises several times each week as directed by your health care provider. ?How does menopause affect my mental health? ?Depression may occur at any age, but it is more common as you become older. Common symptoms of depression include: ?Feeling depressed. ?Changes in sleep patterns. ?Changes in appetite or eating patterns. ?Feeling an overall lack of motivation or enjoyment of activities that you previously enjoyed. ?Frequent crying spells. ?Talk with your health care provider if you think that you are experiencing any of these symptoms. ?General instructions ?See your health care provider for regular wellness exams and vaccines. This may include: ?Scheduling regular health, dental, and eye exams. ?Getting and maintaining your vaccines. These include: ?Influenza vaccine. Get this vaccine each year before the flu season begins. ?Pneumonia vaccine. ?Shingles vaccine. ?Tetanus, diphtheria, and pertussis (Tdap) booster vaccine. ?Your health care provider may also recommend other immunizations. ?Tell your health care provider if you have ever been abused or do not feel safe at home. ?Summary ?Menopause is a normal process in which your ability to get pregnant comes to an end. ?This condition causes hot flashes, night sweats, decreased interest in sex, mood swings, headaches, or lack  of sleep. ?Treatment for this condition may include hormone replacement therapy. ?Take actions to keep yourself healthy, including exercising regularly, eating a healthy diet, watching your weight, and checking your blood pressure and blood sugar levels. ?Get screened for cancer and depression. Make sure that you are up to date with all your vaccines. ?This information is not intended to replace advice given to you by your health care provider. Make sure you discuss any questions you have with your health care provider. ?Document Revised: 08/02/2020 Document Reviewed: 08/02/2020 ?Elsevier Patient Education ? 2022 Elsevier Inc. ? ?

## 2021-02-24 NOTE — Progress Notes (Signed)
Subjective:    Patient ID: Margaret Mcintosh, female    DOB: [birth year], 87 y.o.   MRN: [id number]  HPI  Patient presents the clinic today for her annual exam.  Flu: 11/2020 Tetanus: >10 years ago COVID: Moderna x2 Shingrix: never Pneumovax: 03/2013 Prevnar: 02/2016 Pap smear: No longer screening Mammogram: No longer screening Bone density: > 5 years ago Colon screening: No longer screening Vision screening: annually Dentist: biannually  Diet: She does eat meat. She does eat fruits and veggies. She does eat some fried foods. She drinks mostly water and coffee. Exercise: Walking  Review of Systems     Past Medical History:  Diagnosis Date   Arthritis    Cataract    Complication of anesthesia    "takes a long time to wake me up" (11/04/2015)   Diabetes mellitus (HCC)    GERD (gastroesophageal reflux disease)    Glaucoma    Hypertension    Stroke ([place])    last 8/17. Mild left arm/leg weakness   TIA (transient ischemic attack) 11/04/2015    Current Outpatient Medications  Medication Sig Dispense Refill   amLODipine (NORVASC) 5 MG tablet Take 5 mg by mouth daily. 90 tablet 3   aspirin 325 MG EC tablet Take 1 tablet by mouth daily.     Cholecalciferol (VITAMIN D3 PO) Take by mouth.     Elastic Bandages & Supports (MEDICAL COMPRESSION SOCKS) MISC 2 Devices by Does not apply route daily. 2 each 0   FREESTYLE LITE test strip 1 each by Other route daily. Use to test blood sugar up to 4 times daily. 90 each 4   gabapentin (NEURONTIN) 100 MG capsule Take 1 capsule (100 mg total) by mouth at bedtime. INCREASE to 1 capsule in am and at bedtime after 2 weeks if needed. 60 capsule 1   labetalol (NORMODYNE) 100 MG tablet TAKE 1 TABLET BY MOUTH THREE TIMES DAILY 90 tablet 2   losartan (COZAAR) 50 MG tablet Take 1 tablet (50 mg total) by mouth daily. with food 90 tablet 2   metFORMIN (GLUCOPHAGE) 500 MG tablet Take 1 tablet (500 mg total) by mouth 2 (two) times daily with a meal. 180  tablet 1   Multiple Vitamin (MULTIVITAMIN) tablet Take 1 tablet by mouth daily.     Omega-3 Fatty Acids (FISH OIL) 1200 MG CAPS Take by mouth 3 (three) times daily.     pantoprazole (PROTONIX) 40 MG tablet TAKE 1 TABLET BY MOUTH ONCE DAILY AT 6 AM 90 tablet 0   simvastatin (ZOCOR) 20 MG tablet TAKE 1 TABLET BY MOUTH AT BEDTIME 90 tablet 1   No current facility-administered medications for this visit.    Allergies  Allergen Reactions   Codeine Other (See Comments)    Gets nausea and feels like"something is crawling" "somethings crawling in my head"    Diphenhydramine Hcl Other (See Comments)    Feels like skin crawling "somethings crawling in my head"    Other Other (See Comments)    CANNOT HAVE ANY "SPICY" FOODS!!    Family History  Problem Relation Age of Onset   Hypertension Mother    Diabetes Mother    Heart disease Mother    Cancer Sister    Diabetes Brother    Hypertension Daughter    Other Son        Pituitary gland disease   Hypertension Son    Obesity Son    Colon cancer Brother    Diabetes Brother  Alzheimer's disease Brother    Diabetes Sister     Social History   Socioeconomic History   Marital status: Widowed    Spouse name: Not on file   Number of children: 2   Years of education: Not on file   Highest education level: Not on file  Occupational History   Occupation: Retired  Tobacco Use   Smoking status: Never   Smokeless tobacco: Never  Vaping Use   Vaping Use: Never used  Substance and Sexual Activity   Alcohol use: No   Drug use: No   Sexual activity: Not Currently  Other Topics Concern   Not on file  Social History Narrative   Not on file   Social Determinants of Health   Financial Resource Strain: Not on file  Food Insecurity: Not on file  Transportation Needs: Not on file  Physical Activity: Not on file  Stress: Not on file  Social Connections: Not on file  Intimate Partner Violence: Not on file     Constitutional: Denies  fever, malaise, fatigue, headache or abrupt weight changes.  HEENT: Denies eye pain, eye redness, ear pain, ringing in the ears, wax buildup, runny nose, nasal congestion, bloody nose, or sore throat. Respiratory: Denies difficulty breathing, shortness of breath, cough or sputum production.   Cardiovascular: Denies chest pain, chest tightness, palpitations or swelling in the hands or feet.  Gastrointestinal: Patient reports constipation.  Denies abdominal pain, bloating, diarrhea or blood in the stool.  GU: Patient reports urinary urgency.  Denies frequency, pain with urination, burning sensation, blood in urine, odor or discharge. Musculoskeletal: Denies decrease in range of motion, difficulty with gait, muscle pain or joint pain and swelling.  Skin: Denies redness, rashes, lesions or ulcercations.  Neurological: Patient reports intermittent dizziness.  Denies difficulty with memory, difficulty with speech or problems with balance and coordination.  Psych: Denies anxiety, depression, SI/HI.  No other specific complaints in a complete review of systems (except as listed in HPI above).  Objective:   Physical Exam   BP (!) 137/54 (BP Location: Right Arm, Patient Position: Sitting, Cuff Size: Normal)   Pulse 60   Temp 98.4 F (36.9 C) (Temporal)   Resp 17   Ht $R'5\' 4"'IY$  (1.626 m)   Wt 173 lb (78.5 kg)   SpO2 99%   BMI 29.70 kg/m   Wt Readings from Last 3 Encounters:  12/09/20 170 lb 9.6 oz (77.4 kg)  11/25/20 172 lb (78 kg)  11/19/20 172 lb 9.6 oz (78.3 kg)    General: Appears her stated age, overweight, in NAD. Skin: Warm, dry and intact. No ulcerations noted. HEENT: Head: normal shape and size; Eyes: sclera white and EOMs intact;  Neck:  Neck supple, trachea midline. No masses, lumps or thyromegaly present.  Cardiovascular: Normal rate and rhythm. S1,S2 noted.  No murmur, rubs or gallops noted. No JVD or BLE edema. No carotid bruits noted. Pulmonary/Chest: Normal effort and positive  vesicular breath sounds. No respiratory distress. No wheezes, rales or ronchi noted.  Abdomen: Soft and nontender. Normal bowel sounds. No distention or masses noted.  Musculoskeletal: Strength 5/5 BUE/BLE.  No difficulty with gait.  Neurological: Alert and oriented. Cranial nerves II-XII grossly intact. Coordination normal.  Psychiatric: Mood and affect normal. Behavior is normal. Judgment and thought content normal.    BMET    Component Value Date/Time   NA 137 11/22/2020 0827   NA 135 (L) 04/09/2013 1025   K 4.3 11/22/2020 0827   K 3.8 04/09/2013  1025   CL 104 11/22/2020 0827   CL 103 04/09/2013 1025   CO2 26 11/22/2020 0827   CO2 28 04/09/2013 1025   GLUCOSE 140 (H) 11/22/2020 0827   GLUCOSE 211 (H) 04/09/2013 1025   BUN 17 11/22/2020 0827   BUN 14 04/09/2013 1025   CREATININE 0.96 11/22/2020 0827   CREATININE 0.93 11/19/2020 1523   CALCIUM 9.5 11/22/2020 0827   CALCIUM 9.2 04/09/2013 1025   GFRNONAA 58 (L) 11/22/2020 0827   GFRNONAA 58 (L) 11/18/2019 1010   GFRAA 67 11/18/2019 1010    Lipid Panel     Component Value Date/Time   CHOL 123 11/19/2020 1523   TRIG 110 11/19/2020 1523   HDL 52 11/19/2020 1523   CHOLHDL 2.4 11/19/2020 1523   VLDL 23 11/05/2015 0549   LDLCALC 51 11/19/2020 1523    CBC    Component Value Date/Time   WBC 3.7 (L) 11/22/2020 0827   RBC 3.83 (L) 11/22/2020 0827   HGB 11.7 (L) 11/22/2020 0827   HGB 14.9 04/09/2013 1025   HCT 35.3 (L) 11/22/2020 0827   HCT 44.4 04/09/2013 1025   PLT 158 11/22/2020 0827   PLT 184 04/09/2013 1025   MCV 92.2 11/22/2020 0827   MCV 89 04/09/2013 1025   MCH 30.5 11/22/2020 0827   MCHC 33.1 11/22/2020 0827   RDW 13.6 11/22/2020 0827   RDW 13.5 04/09/2013 1025   LYMPHSABS 1.0 11/22/2020 0827   MONOABS 0.4 11/22/2020 0827   EOSABS 0.1 11/22/2020 0827   BASOSABS 0.0 11/22/2020 0827    Hgb A1C Lab Results  Component Value Date   HGBA1C 5.9 (H) 11/19/2020           Assessment & Plan:    Preventative Health Maintenance:  Flu shot UTD She declines tetanus for financial reasons Encouraged her to get her COVID booster Pneumovax and Prevnar UTD She declines shingles vaccine at this time She no longer wants to screen for cervical, breast or colon cancer She no longer wants to screen for osteoporosis Encouraged her to consume a balanced diet and exercise regimen Advised her to see an eye doctor and dentist annually We will check CBC, c-Met, lipid and A1c today  RTC in 6 months, follow-up chronic conditions  Webb Silversmith, NP This visit occurred during the SARS-CoV-2 public health emergency.  Safety protocols were in place, including screening questions prior to the visit, additional usage of staff PPE, and extensive cleaning of exam room while observing appropriate contact time as indicated for disinfecting solutions.

## 2021-02-25 LAB — COMPLETE METABOLIC PANEL WITH GFR
AG Ratio: 1.6 (calc) (ref 1.0–2.5)
ALT: 12 U/L (ref 6–29)
AST: 15 U/L (ref 10–35)
Albumin: 4.4 g/dL (ref 3.6–5.1)
Alkaline phosphatase (APISO): 55 U/L (ref 37–153)
BUN: 21 mg/dL (ref 7–25)
CO2: 29 mmol/L (ref 20–32)
Calcium: 10 mg/dL (ref 8.6–10.4)
Chloride: 104 mmol/L (ref 98–110)
Creat: 0.92 mg/dL (ref 0.60–0.95)
Globulin: 2.7 g/dL (calc) (ref 1.9–3.7)
Glucose, Bld: 111 mg/dL (ref 65–139)
Potassium: 5.2 mmol/L (ref 3.5–5.3)
Sodium: 141 mmol/L (ref 135–146)
Total Bilirubin: 0.5 mg/dL (ref 0.2–1.2)
Total Protein: 7.1 g/dL (ref 6.1–8.1)
eGFR: 61 mL/min/{1.73_m2} (ref >=60)

## 2021-02-25 LAB — HEMOGLOBIN A1C
Hgb A1c MFr Bld: 6.5 % of total Hgb — ABNORMAL HIGH (ref <5.7)
Mean Plasma Glucose: 140 mg/dL
eAG (mmol/L): 7.7 mmol/L

## 2021-02-25 LAB — LIPID PANEL
Cholesterol: 146 mg/dL (ref <200)
HDL: 69 mg/dL (ref >=50)
LDL Cholesterol (Calc): 59 mg/dL (calc)
Non-HDL Cholesterol (Calc): 77 mg/dL (calc) (ref <130)
Total CHOL/HDL Ratio: 2.1 (calc) (ref <5.0)
Triglycerides: 97 mg/dL (ref <150)

## 2021-02-25 LAB — CBC
HCT: 36.5 % (ref 35.0–45.0)
Hemoglobin: 11.7 g/dL (ref 11.7–15.5)
MCH: 29.5 pg (ref 27.0–33.0)
MCHC: 32.1 g/dL (ref 32.0–36.0)
MCV: 91.9 fL (ref 80.0–100.0)
MPV: 12.2 fL (ref 7.5–12.5)
Platelets: 182 10*3/uL (ref 140–400)
RBC: 3.97 10*6/uL (ref 3.80–5.10)
RDW: 12.2 % (ref 11.0–15.0)
WBC: 5 10*3/uL (ref 3.8–10.8)

## 2021-03-10 ENCOUNTER — Ambulatory Visit: Payer: PPO | Admitting: Podiatry

## 2021-05-05 ENCOUNTER — Other Ambulatory Visit: Payer: Self-pay | Admitting: Internal Medicine

## 2021-05-05 DIAGNOSIS — K219 Gastro-esophageal reflux disease without esophagitis: Secondary | ICD-10-CM

## 2021-05-05 NOTE — Telephone Encounter (Signed)
Requested Prescriptions  Pending Prescriptions Disp Refills   pantoprazole (PROTONIX) 40 MG tablet [Pharmacy Med Name: Pantoprazole Sodium 40 MG Oral Tablet Delayed Release] 90 tablet 0    Sig: TAKE 1 TABLET BY MOUTH ONCE DAILY AT  6AM     Gastroenterology: Proton Pump Inhibitors Passed - 05/05/2021 11:09 AM      Passed - Valid encounter within last 12 months    Recent Outpatient Visits          2 months ago Type 2 diabetes mellitus with hyperglycemia, without long-term current use of insulin [facility])   [facility], Coralie Keens, NP   4 months ago Essential hypertension   [place], Coralie Keens, NP   5 months ago [facility] [place], [place] W, NP   5 months ago Type 2 diabetes mellitus with hyperglycemia, without long-term current use of insulin [facility])   [facility] [place], Coralie Keens, NP   8 months ago Chronic right hip pain   [facility] [employer], Coralie Keens, NP      Future Appointments            In 3 months Baity, Coralie Keens, NP [facility], [facility]

## 2021-05-08 ENCOUNTER — Other Ambulatory Visit: Payer: Self-pay | Admitting: Internal Medicine

## 2021-05-09 NOTE — Telephone Encounter (Signed)
Requested Prescriptions  Pending Prescriptions Disp Refills   labetalol (NORMODYNE) 100 MG tablet [Pharmacy Med Name: Labetalol HCl 100 MG Oral Tablet] 90 tablet 2    Sig: TAKE 1 TABLET BY MOUTH THREE TIMES DAILY     Cardiovascular:  Beta Blockers Passed - 05/08/2021  9:51 AM      Passed - Last BP in normal range    BP Readings from Last 1 Encounters:  02/24/21 (!) 137/54         Passed - Last Heart Rate in normal range    Pulse Readings from Last 1 Encounters:  02/24/21 60         Passed - Valid encounter within last 6 months    Recent Outpatient Visits          2 months ago Type 2 diabetes mellitus with hyperglycemia, without long-term current use of insulin [facility]   [facility], Salvadore Oxford, NP   5 months ago Essential hypertension   [facility] [place], Salvadore Oxford, NP   5 months ago Orthostasis   [facility] [place], [place] W, NP   5 months ago Type 2 diabetes mellitus with hyperglycemia, without long-term current use of insulin [facility])   [facility] [place], Salvadore Oxford, NP   8 months ago Chronic right hip pain   [facility] [place], Salvadore Oxford, NP      Future Appointments            In 3 months Baity, Salvadore Oxford, NP [facility], [facility]

## 2021-05-22 ENCOUNTER — Other Ambulatory Visit: Payer: Self-pay | Admitting: Internal Medicine

## 2021-05-22 DIAGNOSIS — E119 Type 2 diabetes mellitus without complications: Secondary | ICD-10-CM

## 2021-05-24 NOTE — Telephone Encounter (Signed)
Requested medication (s) are due for refill today:   No  Requested medication (s) are on the active medication list:   Yes  Future visit scheduled:   Yes   Last ordered: 01/18/2021 #180, 1 refill  Returned because protocol criteria not met.  Needs B12, A1c and GFR out of range   Requested Prescriptions  Pending Prescriptions Disp Refills   metFORMIN (GLUCOPHAGE) 500 MG tablet [Pharmacy Med Name: metFORMIN HCl 500 MG Oral Tablet] 180 tablet 0    Sig: TAKE 1 TABLET BY MOUTH TWICE DAILY WITH A MEAL     Endocrinology:  Diabetes - Biguanides Failed - 05/22/2021 10:04 AM      Failed - B12 Level in normal range and within 720 days    No results found for: VITAMINB12        Passed - Cr in normal range and within 360 days    Creat  Date Value Ref Range Status  02/24/2021 0.92 0.60 - 0.95 mg/dL Final          Passed - HBA1C is between 0 and 7.9 and within 180 days    Hgb A1c MFr Bld  Date Value Ref Range Status  02/24/2021 6.5 (H) <5.7 % of total Hgb Final    Comment:    For someone without known diabetes, a hemoglobin A1c value of 6.5% or greater indicates that they may have  diabetes and this should be confirmed with a follow-up  test. . For someone with known diabetes, a value <7% indicates  that their diabetes is well controlled and a value  greater than or equal to 7% indicates suboptimal  control. A1c targets should be individualized based on  duration of diabetes, age, comorbid conditions, and  other considerations. . Currently, no consensus exists regarding use of hemoglobin A1c for diagnosis of diabetes for children. .           Passed - eGFR in normal range and within 360 days    GFR, Est African American  Date Value Ref Range Status  11/18/2019 67 > OR = 60 mL/min/1.59m Final   GFR, Est Non African American  Date Value Ref Range Status  11/18/2019 58 (L) > OR = 60 mL/min/1.756mFinal   GFR, Estimated  Date Value Ref Range Status  11/22/2020 58 (L) >60  mL/min Final    Comment:    (NOTE) Calculated using the CKD-EPI Creatinine Equation (2021)    eGFR  Date Value Ref Range Status  02/24/2021 61 > OR = 60 mL/min/1.7341minal    Comment:    The eGFR is based on the CKD-EPI 2021 equation. To calculate  the new eGFR from a previous Creatinine or Cystatin C result, go to https://www.kidney.org/professionals/ kdoqi/gfr%5Fcalculator           Passed - Valid encounter within last 6 months    Recent Outpatient Visits           2 months ago Type 2 diabetes mellitus with hyperglycemia, without long-term current use of insulin [facility]   5 months ago Essential hypertension   [facility]   [facility] [place] [facility]   6 months ago Type 2 diabetes mellitus with hyperglycemia, without long-term current use of insulin [facility]   8 months ago Chronic right hip pain  [facility] [place], Coralie Keens, NP       Future Appointments             In 3 months [place], Coralie Keens, NP [facility]            Passed - CBC within normal limits and completed in the last 12 months    WBC  Date Value Ref Range Status  02/24/2021 5.0 3.8 - 10.8 Thousand/uL Final   RBC  Date Value Ref Range Status  02/24/2021 3.97 3.80 - 5.10 Million/uL Final   Hemoglobin  Date Value Ref Range Status  02/24/2021 11.7 11.7 - 15.5 g/dL Final   HGB  Date Value Ref Range Status  04/09/2013 14.9 12.0 - 16.0 g/dL Final   HCT  Date Value Ref Range Status  02/24/2021 36.5 35.0 - 45.0 % Final  04/09/2013 44.4 35.0 - 47.0 % Final   MCHC  Date Value Ref Range Status  02/24/2021 32.1 32.0 - 36.0 g/dL Final   [facility]  Date Value Ref Range Status  02/24/2021 29.5 27.0 - 33.0 pg Final   MCV  Date Value Ref Range Status  02/24/2021 91.9 80.0 - 100.0 fL Final   04/09/2013 89 80 - 100 fL Final   No results found for: PLTCOUNTKUC, LABPLAT, POCPLA RDW  Date Value Ref Range Status  02/24/2021 12.2 11.0 - 15.0 % Final  04/09/2013 13.5 11.5 - 14.5 % Final

## 2021-07-07 ENCOUNTER — Ambulatory Visit: Payer: Self-pay

## 2021-07-07 NOTE — Telephone Encounter (Signed)
?  Chief Complaint: Knee pain ?Symptoms: Knee pain ?Frequency: 1 month ?Pertinent Negatives: Patient denies fever, rednedd ?Disposition: [] ED /[] Urgent Care (no appt availability in office) / [x] Appointment(In office/virtual)/ []  [facility]/ [] Home Care/ [] Refused Recommended Disposition /[]  Mobile Bus/ []  Follow-up with PCP ?Additional Notes: Pt has had knee pain for about 1 month. Difficult/ painful to walk. Small amount of ankle swelling.  Pain goes from knee to hip and into her back when walking. ? ?Reason for Disposition ? [1] MODERATE pain (e.g., interferes with normal activities, limping) AND [2] present > 3 days ? ?Answer Assessment - Initial Assessment Questions ?1. ONSET: "When did the pain start?"  ?    1 month ?2. LOCATION: "Where is the pain located?"  ?    Knee to hip and back ?3. PAIN: "How bad is the pain?"    (Scale 1-10; or mild, moderate, severe) ?  -  MILD (1-3): doesn't interfere with normal activities  ?  -  MODERATE (4-7): interferes with normal activities (e.g., work or school) or awakens from sleep, limping  ?  -  SEVERE (8-10): excruciating pain, unable to do any normal activities, unable to walk ?    9/10 ?4. WORK OR EXERCISE: "Has there been any recent work or exercise that involved this part of the body?"  ?    no ?5. CAUSE: "What do you think is causing the leg pain?" ?    unknown ?6. OTHER SYMPTOMS: "Do you have any other symptoms?" (e.g., chest pain, back pain, breathing difficulty, swelling, rash, fever, numbness, weakness) ?    Swelling at ankles ?7. PREGNANCY: "Is there any chance you are pregnant?" "When was your last menstrual period?" ?    na ? ?Protocols used: Leg Pain-A-AH ? ?

## 2021-07-08 ENCOUNTER — Ambulatory Visit (INDEPENDENT_AMBULATORY_CARE_PROVIDER_SITE_OTHER): Payer: PPO | Admitting: Family Medicine

## 2021-07-08 ENCOUNTER — Other Ambulatory Visit: Payer: Self-pay | Admitting: Family Medicine

## 2021-07-08 ENCOUNTER — Encounter: Payer: Self-pay | Admitting: Family Medicine

## 2021-07-08 VITALS — BP 136/68 | HR 63 | Ht 64.0 in | Wt 176.4 lb

## 2021-07-08 DIAGNOSIS — I10 Essential (primary) hypertension: Secondary | ICD-10-CM

## 2021-07-08 DIAGNOSIS — R202 Paresthesia of skin: Secondary | ICD-10-CM

## 2021-07-08 DIAGNOSIS — M5432 Sciatica, left side: Secondary | ICD-10-CM | POA: Diagnosis not present

## 2021-07-08 DIAGNOSIS — G8929 Other chronic pain: Secondary | ICD-10-CM | POA: Diagnosis not present

## 2021-07-08 DIAGNOSIS — M5442 Lumbago with sciatica, left side: Secondary | ICD-10-CM | POA: Diagnosis not present

## 2021-07-08 MED ORDER — LABETALOL HCL 100 MG PO TABS: 100.0000 mg | ORAL_TABLET | Freq: Three times a day (TID) | ORAL | 1 refills | Status: DC

## 2021-07-08 MED ORDER — PREDNISONE 10 MG PO TABS: ORAL_TABLET | ORAL | 0 refills | Status: DC

## 2021-07-08 NOTE — Progress Notes (Signed)
? ?Subjective:  ? ? Patient ID: Margaret Mcintosh, female    DOB: 15-Aug-1932, 88 y.o.   MRN: [id number] ? ?Margaret Mcintosh is a 88 y.o. female presenting on 07/08/2021 for Knee Pain ? ?PCP Webb Silversmith, FNP ? ?Patient presents for a same day appointment. ? ?HPI ? ?Low Back Pain / Left Leg Pain with Sciatica ? ?History of CVA 7 years ago. She has been using cane to ambulate recently. ? ?- Reports symptoms started about 1 month ago without inciting injury. Today seems to be episodic with some flares of worsening with cold rainy weather, improved with heating pad. With cold air has throbbing pain in legs. Severity from moderate to severe. Worse with activities, walking and bending flexing. ?- Taking Tylenol 500-665m x 2 tabs 3 times a day, NSAIDs Ibuprofen 2042mx 2 = 40041m times a day. Some relief but worse with more activity ?- Tried heating pad at night ?- History of Back pain previously. No imaging available. In past failed Gabapentin for back pain, did not provide relief inc to 1-3 per day back in 2022 ?- Admits difficulty sleeping due to pain ?- Denies any fevers/chills, numbness, tingling, weakness, loss of control bladder/bowel incontinence or retention, unintentional wt loss, night sweats ? ? ? ?  11/25/2020  ? 12:34 PM 09/30/2019  ?  9:05 AM 05/21/2019  ?  9:59 AM  ?Depression screen PHQ 2/9  ?Decreased Interest 0 0 0  ?Down, Depressed, Hopeless 0 0 0  ?PHQ - 2 Score 0 0 0  ? ? ?Social History  ? ?Tobacco Use  ? Smoking status: Never  ? Smokeless tobacco: Never  ?Vaping Use  ? Vaping Use: Never used  ?Substance Use Topics  ? Alcohol use: No  ? Drug use: No  ? ? ?Review of Systems ?Per HPI unless specifically indicated above ? ?   ?Objective:  ?  ?BP 136/68   Pulse 63   Ht 5' 4" (1.626 m)   Wt 176 lb 6.4 oz (80 kg)   SpO2 99%   BMI 30.28 kg/m?   ?Wt Readings from Last 3 Encounters:  ?07/08/21 176 lb 6.4 oz (80 kg)  ?02/24/21 173 lb (78.5 kg)  ?12/09/20 170 lb 9.6 oz (77.4 kg)  ?  ?Physical Exam ?Vitals and  nursing note reviewed.  ?Constitutional:   ?   General: She is not in acute distress. ?   Appearance: Normal appearance. She is well-developed. She is not diaphoretic.  ?   Comments: Well-appearing, comfortable, cooperative  ?HENT:  ?   Head: Normocephalic and atraumatic.  ?Eyes:  ?   General:     ?   Right eye: No discharge.     ?   Left eye: No discharge.  ?   Conjunctiva/sclera: Conjunctivae normal.  ?Cardiovascular:  ?   Rate and Rhythm: Normal rate.  ?Pulmonary:  ?   Effort: Pulmonary effort is normal.  ?Musculoskeletal:  ?   Comments: Low Back ?Inspection: Normal appearance, no spinal deformity, symmetrical. ?Palpation: No tenderness over spinous processes. Bilateral lumbar paraspinal muscles mild tender LEFT lower with spasm ?ROM: Full active ROM forward flex / back extension, rotation L/R without discomfort ?Special Testing: Seated SLR with some reproduced pain Left sided into thigh ? ?Strength: Bilateral hip flex/ext 5/5, knee flex/ext 5/5, ankle dorsiflex/plantarflex 5/5 ?Neurovascular: intact distal sensation to light touch ?  ?Skin: ?   General: Skin is warm and dry.  ?   Findings: No erythema or rash.  ?Neurological:  ?  Mental Status: She is alert and oriented to person, place, and time.  ?Psychiatric:     ?   Mood and Affect: Mood normal.     ?   Behavior: Behavior normal.     ?   Thought Content: Thought content normal.  ?   Comments: Well groomed, good eye contact, normal speech and thoughts  ? ?Results for orders placed or performed in visit on 02/24/21  ?CBC  ?Result Value Ref Range  ? WBC 5.0 3.8 - 10.8 Thousand/uL  ? RBC 3.97 3.80 - 5.10 Million/uL  ? Hemoglobin 11.7 11.7 - 15.5 g/dL  ? HCT 36.5 35.0 - 45.0 %  ? MCV 91.9 80.0 - 100.0 fL  ? MCH 29.5 27.0 - 33.0 pg  ? MCHC 32.1 32.0 - 36.0 g/dL  ? RDW 12.2 11.0 - 15.0 %  ? Platelets 182 140 - 400 Thousand/uL  ? MPV 12.2 7.5 - 12.5 fL  ?COMPLETE METABOLIC PANEL WITH GFR  ?Result Value Ref Range  ? Glucose, Bld 111 65 - 139 mg/dL  ? BUN 21 7 - 25  mg/dL  ? Creat 0.92 0.60 - 0.95 mg/dL  ? eGFR 61 > OR = 60 mL/min/1.33m  ? BUN/Creatinine Ratio NOT APPLICABLE 6 - 22 (calc)  ? Sodium 141 135 - 146 mmol/L  ? Potassium 5.2 3.5 - 5.3 mmol/L  ? Chloride 104 98 - 110 mmol/L  ? CO2 29 20 - 32 mmol/L  ? Calcium 10.0 8.6 - 10.4 mg/dL  ? Total Protein 7.1 6.1 - 8.1 g/dL  ? Albumin 4.4 3.6 - 5.1 g/dL  ? Globulin 2.7 1.9 - 3.7 g/dL (calc)  ? AG Ratio 1.6 1.0 - 2.5 (calc)  ? Total Bilirubin 0.5 0.2 - 1.2 mg/dL  ? Alkaline phosphatase (APISO) 55 37 - 153 U/L  ? AST 15 10 - 35 U/L  ? ALT 12 6 - 29 U/L  ?Lipid panel  ?Result Value Ref Range  ? Cholesterol 146 <200 mg/dL  ? HDL 69 > OR = 50 mg/dL  ? Triglycerides 97 <150 mg/dL  ? LDL Cholesterol (Calc) 59 mg/dL (calc)  ? Total CHOL/HDL Ratio 2.1 <5.0 (calc)  ? Non-HDL Cholesterol (Calc) 77 <130 mg/dL (calc)  ?Hemoglobin A1c  ?Result Value Ref Range  ? Hgb A1c MFr Bld 6.5 (H) <5.7 % of total Hgb  ? Mean Plasma Glucose 140 mg/dL  ? eAG (mmol/L) 7.7 mmol/L  ?HM DIABETES EYE EXAM  ?Result Value Ref Range  ? HM Diabetic Eye Exam No Retinopathy No Retinopathy  ? ?   ?Assessment & Plan:  ? ?Problem List Items Addressed This Visit   ?None ?Visit Diagnoses   ? ? Left sided sciatica    -  Primary  ? Relevant Medications  ? predniSONE (DELTASONE) 10 MG tablet  ? Chronic midline low back pain with left-sided sciatica      ? Stable. Continue current medication.   ? Relevant Medications  ? predniSONE (DELTASONE) 10 MG tablet  ? Paresthesia      ? Stable. Continue current medication of Gabapentin.   ? ?  ?  ?Acute on chronic L LBP with associated L sciatica. Suspect likely due to muscle spasm ?Suspected underlying OA/DJD no prior imaging or dx ?- No red flag symptoms. Some reproduced symptoms w/ SLR ?- Inadequate conservative therapy  ? ?Using cane to ambulate ? ?Plan: ?1. Start Prednisone taper for 6 days, HOLD oral NSAID ?2. Keep on Tylenol high dose as advised 10022mTID ?3.  Encouraged use of heating pad 1-2x daily for now then PRN ?F/u if  not improving 2-4 weeks, consider X-rays, consider Gabapentin repeat trial vs Muscle relaxant, defer on these for now she has tried and failed Gabapentin in 2022 for back pain ? ?Handicap placard today ? ? ?Meds ordered this encounter  ?Medications  ? predniSONE (DELTASONE) 10 MG tablet  ?  Sig: Take 6 tabs with breakfast Day 1, 5 tabs Day 2, 4 tabs Day 3, 3 tabs Day 4, 2 tabs Day 5, 1 tab Day 6.  ?  Dispense:  21 tablet  ?  Refill:  0  ? ? ? ? ?Follow up plan: ?Return if symptoms worsen or fail to improve. ? ? ?Nobie Putnam, DO ?[facility] ?[facility] ?07/08/2021, 2:38 PM ?

## 2021-07-08 NOTE — Patient Instructions (Addendum)
Thank you for coming to the office today. ? ?1. For your Back Pain - I think that this is due to Muscle Spasms or strain. Your Sciatic Nerve can be affected causing some of your radiation and numbness down your legs. ? ?- Start Prednisone taper (steroid anti-inflammatory) for nerve irritation with pain in legs. Each pill is 10mg . Take 6 pills (60mg  daily) for 1 day at same time with breakfast, then each day reduce dose by 1 pill, so 5 pills, then 4, then 3, then 2 then 1 (last 6 days). Do not take any Ibuprofen or Aleve while taking the Prednisone.  ?- Once finished Prednisone, then start with other anti-inflammatory  - Ibuprofen 600mg  (take 3 of the 200mg  pills) per dose with food every 6 to 8 hours or 3 times a day, take it every day for at least 2 to 4 weeks then only as needed ? ?Recommend to start taking Tylenol Extra Strength 500mg  tabs - take 1 to 2 tabs per dose (max 1000mg ) every 6-8 hours for pain (take regularly, don't skip a dose for next 7 days), max 24 hour daily dose is 6 tablets or 3000mg . In the future you can repeat the same everyday Tylenol course for 1-2 weeks at a time.  ? ? ?Recommend to start using heating pad on your lower back 1-2x daily for few weeks ? ?Also try a Wedge Seat Cushion to avoid nerve pinching when sitting prolonged period of time. ? ?This pain may take weeks to months to fully resolve, but hopefully it will respond to the medicine initially. All back injuries (small or serious) are slow to heal since we use our back muscles every day. Be careful with turning, twisting, lifting, sitting / standing for prolonged periods, and avoid re-injury. ? ?If your symptoms significantly worsen with more pain, or new symptoms with weakness in one or both legs, new or different shooting leg pains, numbness in legs or groin, loss of control or retention of urine or bowel movements, please call back for advice and you may need to go directly to the Emergency Department. ? ? ?Please schedule a  Follow-up Appointment to: Return if symptoms worsen or fail to improve. ? ?If you have any other questions or concerns, please feel free to call the office or send a message through MyChart. You may also schedule an earlier appointment if necessary. ? ?Additionally, you may be receiving a survey about your experience at our office within a few days to 1 week by e-mail or mail. We value your feedback. ? ? , DO ?[facility], ?

## 2021-07-24 ENCOUNTER — Other Ambulatory Visit: Payer: Self-pay | Admitting: Internal Medicine

## 2021-07-24 DIAGNOSIS — E782 Mixed hyperlipidemia: Secondary | ICD-10-CM

## 2021-07-25 NOTE — Telephone Encounter (Signed)
Requested Prescriptions  ?Pending Prescriptions Disp Refills  ?? simvastatin (ZOCOR) 20 MG tablet [Pharmacy Med Name: Simvastatin 20 MG Oral Tablet] 90 tablet 1  ?  Sig: TAKE 1 TABLET BY MOUTH AT BEDTIME  ?  ? Cardiovascular:  Antilipid - Statins Failed - 07/24/2021  9:10 AM  ?  ?  Failed - Lipid Panel in normal range within the last 12 months  ?  Cholesterol  ?Date Value Ref Range Status  ?02/24/2021 146 <200 mg/dL Final  ? ?LDL Cholesterol (Calc)  ?Date Value Ref Range Status  ?02/24/2021 59 mg/dL (calc) Final  ?  Comment:  ?  Reference range: <100 ?Marland Kitchen ?Desirable range <100 mg/dL for primary prevention;   ?<70 mg/dL for patients with CHD or diabetic patients  ?with > or = 2 CHD risk factors. ?. ?LDL-C is now calculated using the Martin-Hopkins  ?calculation, which is a validated novel method providing  ?better accuracy than the Friedewald equation in the  ?estimation of LDL-C.  ?Horald Pollen et al. Lenox Ahr. 1950;932(67): 2061-2068  ?(http://education.[employer].com/faq/FAQ164) ?  ? ?HDL  ?Date Value Ref Range Status  ?02/24/2021 69 > OR = 50 mg/dL Final  ? ?Triglycerides  ?Date Value Ref Range Status  ?02/24/2021 97 <150 mg/dL Final  ? ?  ?  ?  Passed - Patient is not pregnant  ?  ?  Passed - Valid encounter within last 12 months  ?  Recent Outpatient Visits   ?      ? 2 weeks ago Left sided sciatica  ? [facility] [place], Netta Neat, DO  ? 5 months ago Type 2 diabetes mellitus with hyperglycemia, without long-term current use of insulin (HCC)  ? [facility] [place], [place] W, NP  ? 7 months ago Essential hypertension  ? [facility] [place], [place] W, NP  ? 8 months ago Orthostasis  ? [facility] [place], [place] W, NP  ? 8 months ago Type 2 diabetes mellitus with hyperglycemia, without long-term current use of insulin (HCC)  ? [facility] [place], Salvadore Oxford, NP  ?  ?  ?Future Appointments   ?        ? In 1 month Baity, Salvadore [place], NP [facility]  ?  ? ?  ?  ?  ? ?

## 2021-07-29 ENCOUNTER — Ambulatory Visit (INDEPENDENT_AMBULATORY_CARE_PROVIDER_SITE_OTHER): Payer: PPO

## 2021-07-29 ENCOUNTER — Other Ambulatory Visit: Payer: Self-pay | Admitting: Internal Medicine

## 2021-07-29 VITALS — Wt 176.0 lb

## 2021-07-29 DIAGNOSIS — K219 Gastro-esophageal reflux disease without esophagitis: Secondary | ICD-10-CM

## 2021-07-29 DIAGNOSIS — Z0000 Encounter for general adult medical examination without abnormal findings: Secondary | ICD-10-CM

## 2021-07-29 NOTE — Progress Notes (Signed)
?Virtual Visit via Telephone Note ? ?I connected with  Margaret Mcintosh on 07/29/21 at  9:00 AM EDT by telephone and verified that I am speaking with the correct person using two identifiers. ? ?Location: ?Patient: home ?Provider: [facility] ?Persons participating in the virtual visit: patient/Nurse Health Advisor ?  ?I discussed the limitations, risks, security and privacy concerns of performing an evaluation and management service by telephone and the availability of in person appointments. The patient expressed understanding and agreed to proceed. ? ?Interactive audio and video telecommunications were attempted between this nurse and patient, however failed, due to patient having technical difficulties OR patient did not have access to video capability.  We continued and completed visit with audio only. ? ?Some vital signs may be absent or patient reported.  ? ?Margaret Hope, LPN ? ?Subjective:  ? Margaret Mcintosh is a 88 y.o. female who presents for Medicare Annual (Subsequent) preventive examination. ? ?Review of Systems    ? ?  ? ?   ?Objective:  ?  ?There were no vitals filed for this visit. ?There is no height or weight on file to calculate BMI. ? ? ?  09/30/2019  ?  9:04 AM 06/12/2017  ?  7:16 AM 03/07/2016  ?  6:43 AM 02/08/2016  ?  6:51 AM 11/04/2015  ? 10:45 PM 11/04/2015  ? 12:49 PM 11/04/2015  ?  8:38 AM  ?Advanced Directives  ?Does Patient Have a Medical Advance Directive? No Yes Yes Yes Yes Yes Yes  ?Type of Special educational needs teacher of [employer] Power of [place];Living will Healthcare Power of [place];Living will  Living will Living will  ?Copy of Healthcare Power of Attorney in Chart?  Yes No - copy requested No - copy requested  No - copy requested No - copy requested  ?Would patient like information on creating a medical advance directive? No - Patient declined    Yes - Transport planner given    ? ? ?Current Medications (verified) ?Outpatient Encounter Medications as of  07/29/2021  ?Medication Sig  ? amLODipine (NORVASC) 5 MG tablet Take 5 mg by mouth daily.  ? aspirin 325 MG EC tablet Take 1 tablet by mouth daily.  ? Cholecalciferol (VITAMIN D3 PO) Take by mouth.  ? Elastic Bandages & Supports (MEDICAL COMPRESSION SOCKS) MISC 2 Devices by Does not apply route daily.  ? FREESTYLE LITE test strip 1 each by Other route daily. Use to test blood sugar up to 4 times daily.  ? labetalol (NORMODYNE) 100 MG tablet Take 1 tablet (100 mg total) by mouth 3 (three) times daily.  ? losartan (COZAAR) 50 MG tablet Take 1 tablet (50 mg total) by mouth daily. with food  ? metFORMIN (GLUCOPHAGE) 500 MG tablet TAKE 1 TABLET BY MOUTH TWICE DAILY WITH A MEAL  ? Multiple Vitamin (MULTIVITAMIN) tablet Take 1 tablet by mouth daily.  ? Multiple Vitamins-Minerals (GNP ONE DAILY MENS/LYCOPENE) TABS   ? Omega-3 Fatty Acids (FISH OIL) 1200 MG CAPS Take by mouth 3 (three) times daily.  ? pantoprazole (PROTONIX) 40 MG tablet TAKE 1 TABLET BY MOUTH ONCE DAILY AT  6AM  ? predniSONE (DELTASONE) 10 MG tablet Take 6 tabs with breakfast Day 1, 5 tabs Day 2, 4 tabs Day 3, 3 tabs Day 4, 2 tabs Day 5, 1 tab Day 6.  ? simvastatin (ZOCOR) 20 MG tablet TAKE 1 TABLET BY MOUTH AT BEDTIME  ? [DISCONTINUED] gabapentin (NEURONTIN) 100 MG capsule Take 1 capsule (100 mg total)  by mouth at bedtime. INCREASE to 1 capsule in am and at bedtime after 2 weeks if needed.  ? ?No facility-administered encounter medications on file as of 07/29/2021.  ? ? ?Allergies (verified) ?Codeine, Diphenhydramine hcl, and Other  ? ?History: ?Past Medical History:  ?Diagnosis Date  ? Arthritis   ? Cataract   ? Complication of anesthesia   ? "takes a long time to wake me up" (11/04/2015)  ? Diabetes mellitus (HCC)   ? GERD (gastroesophageal reflux disease)   ? Glaucoma   ? Hypertension   ? Stroke [facility])   ? last 8/17. Mild left arm/leg weakness  ? TIA (transient ischemic attack) 11/04/2015  ? ?Past Surgical History:  ?Procedure Laterality Date  ? ANKLE FRACTURE  SURGERY Left 2000  ? "screw is still there" (11/04/2015)  ? APPENDECTOMY  ~1960  ? BROW LIFT Bilateral 06/12/2017  ? Procedure: BLEPHAROPLASTY UPPER EYELID WITH EXCESS SKIN DIABETIC;  Surgeon: Imagene Riches, MD;  [facility];  Service: Ophthalmology;  Laterality: Bilateral;  Diabetic - oral meds  ? CATARACT EXTRACTION W/PHACO Right 02/08/2016  ? Procedure: CATARACT EXTRACTION PHACO AND INTRAOCULAR LENS PLACEMENT (IOC);  Surgeon: Nevada Crane, MD;  Location: [facility];  Service: Ophthalmology;  Laterality: Right;  DIABETIC - oral meds ?RIGHT  ? CATARACT EXTRACTION W/PHACO Left 03/07/2016  ? Procedure: CATARACT EXTRACTION PHACO AND INTRAOCULAR LENS PLACEMENT (IOC);  Surgeon: Nevada Crane, MD;  Location: [facility];  Service: Ophthalmology;  Laterality: Left;  DIABETIC - oral meds ?LEFT  ? FRACTURE SURGERY    ? VAGINAL HYSTERECTOMY  1973  ? ?Family History  ?Problem Relation Age of Onset  ? Hypertension Mother   ? Diabetes Mother   ? Heart disease Mother   ? Cancer Sister   ? Diabetes Brother   ? Hypertension Daughter   ? Other Son   ?     Pituitary gland disease  ? Hypertension Son   ? Obesity Son   ? Colon cancer Brother   ? Diabetes Brother   ? Alzheimer's disease Brother   ? Diabetes Sister   ? ?Social History  ? ?Socioeconomic History  ? Marital status: Widowed  ?  Spouse name: Not on file  ? Number of children: 2  ? Years of education: Not on file  ? Highest education level: Not on file  ?Occupational History  ? Occupation: Retired  ?Tobacco Use  ? Smoking status: Never  ? Smokeless tobacco: Never  ?Vaping Use  ? Vaping Use: Never used  ?Substance and Sexual Activity  ? Alcohol use: No  ? Drug use: No  ? Sexual activity: Not Currently  ?Other Topics Concern  ? Not on file  ?Social History Narrative  ? Not on file  ? ?Social Determinants of Health  ? ?Financial Resource Strain: Not on file  ?Food Insecurity: Not on file  ?Transportation Needs: Not on file  ?Physical  Activity: Not on file  ?Stress: Not on file  ?Social Connections: Not on file  ? ? ?Tobacco Counseling ?Counseling given: Not Answered ? ? ?Clinical Intake: ? ?Pre-visit preparation completed: Yes ? ?Pain : No/denies pain ? ?  ? ?Nutritional Risks: None ?Diabetes: Yes ?CBG done?: No ?Did pt. bring in CBG monitor from home?: No ? ?How often do you need to have someone help you when you read instructions, pamphlets, or other written materials from your doctor or pharmacy?: 1 - Never ? ?Diabetic?yes ?Nutrition Risk Assessment: ? ?Has the patient had any  N/V/D within the last 2 months?  No  ?Does the patient have any non-healing wounds?  No  ?Has the patient had any unintentional weight loss or weight gain?  No  ? ?Diabetes: ? ?Is the patient diabetic?  Yes  ?If diabetic, was a CBG obtained today?  No  ?Did the patient bring in their glucometer from home?  No  ?How often do you monitor your CBG's? Once in a while.  ? ?Financial Strains and Diabetes Management: ? ?Are you having any financial strains with the device, your supplies or your medication? No .  ?Does the patient want to be seen by Chronic Care Management for management of their diabetes?  No  ?Would the patient like to be referred to a Nutritionist or for Diabetic Management?  No  ? ?Diabetic Exams: ? ?Diabetic Eye Exam: Completed 02/24/21. Has another appoitnment the end of the month.    Overdue for diabetic eye exam. Pt has been advised about the importance in completing this exam.  a call from office referred to regarding appt. ? ?Diabetic Foot Exam: Completed 12/09/20. Pt has been advised about the importance in completing this exam.  ? ? ?Interpreter Needed?: No ? ?Information entered by :: Kennedy BuckerLorrie Dilara Navarrete, LPN ? ? ?Activities of Daily Living ?   ? View : No data to display.  ?  ?  ?  ? ? ?Patient Care Team: ?Lorre MunroeBaity, Regina W, NP as PCP - General (Internal Medicine) ? ?Indicate any recent Medical Services you may have received from other than [facility] providers  in the past year (date may be approximate). ? ?   ?Assessment:  ? This is a routine wellness examination [facility]. ? ?Hearing/Vision screen ?No results found. ? ?Dietary issues and exercise activities discu

## 2021-07-29 NOTE — Patient Instructions (Signed)
Margaret Mcintosh , ?Thank you for taking time to come for your Medicare Wellness Visit. I appreciate your ongoing commitment to your health goals. Please review the following plan we discussed and let me know if I can assist you in the future.  ? ?Screening recommendations/referrals: ?Colonoscopy: aged out ?Mammogram: aged out ?Bone Density: aged out ?Recommended yearly ophthalmology/optometry visit for glaucoma screening and checkup ?Recommended yearly dental visit for hygiene and checkup ? ?Vaccinations: ?Influenza vaccine: 12/09/20 ?Pneumococcal vaccine: 02/28/16 ?Tdap vaccine: n/d ?Shingles vaccine: n/d   ?Covid-19:05/11/19, 06/09/19 ? ?Advanced directives: no ? ?Conditions/risks identified: none ? ?Next appointment: Follow up in one year for your annual wellness visit 08/04/22 @ 8:15am by phone ? ? ?Preventive Care 32 Years and Older, Female ?Preventive care refers to lifestyle choices and visits with your health care provider that can promote health and wellness. ?What does preventive care include? ?A yearly physical exam. This is also called an annual well check. ?Dental exams once or twice a year. ?Routine eye exams. Ask your health care provider how often you should have your eyes checked. ?Personal lifestyle choices, including: ?Daily care of your teeth and gums. ?Regular physical activity. ?Eating a healthy diet. ?Avoiding tobacco and drug use. ?Limiting alcohol use. ?Practicing safe sex. ?Taking low-dose aspirin every day. ?Taking vitamin and mineral supplements as recommended by your health care provider. ?What happens during an annual well check? ?The services and screenings done by your health care provider during your annual well check will depend on your age, overall health, lifestyle risk factors, and family history of disease. ?Counseling  ?Your health care provider may ask you questions about your: ?Alcohol use. ?Tobacco use. ?Drug use. ?Emotional well-being. ?Home and relationship well-being. ?Sexual  activity. ?Eating habits. ?History of falls. ?Memory and ability to understand (cognition). ?Work and work Astronomer. ?Reproductive health. ?Screening  ?You may have the following tests or measurements: ?Height, weight, and BMI. ?Blood pressure. ?Lipid and cholesterol levels. These may be checked every 5 years, or more frequently if you are over 49 years old. ?Skin check. ?Lung cancer screening. You may have this screening every year starting at age 21 if you have a 30-pack-year history of smoking and currently smoke or have quit within the past 15 years. ?Fecal occult blood test (FOBT) of the stool. You may have this test every year starting at age 75. ?Flexible sigmoidoscopy or colonoscopy. You may have a sigmoidoscopy every 5 years or a colonoscopy every 10 years starting at age 38. ?Hepatitis C blood test. ?Hepatitis B blood test. ?Sexually transmitted disease (STD) testing. ?Diabetes screening. This is done by checking your blood sugar (glucose) after you have not eaten for a while (fasting). You may have this done every 1-3 years. ?Bone density scan. This is done to screen for osteoporosis. You may have this done starting at age 89. ?Mammogram. This may be done every 1-2 years. Talk to your health care provider about how often you should have regular mammograms. ?Talk with your health care provider about your test results, treatment options, and if necessary, the need for more tests. ?Vaccines  ?Your health care provider may recommend certain vaccines, such as: ?Influenza vaccine. This is recommended every year. ?Tetanus, diphtheria, and acellular pertussis (Tdap, Td) vaccine. You may need a Td booster every 10 years. ?Zoster vaccine. You may need this after age 70. ?Pneumococcal 13-valent conjugate (PCV13) vaccine. One dose is recommended after age 51. ?Pneumococcal polysaccharide (PPSV23) vaccine. One dose is recommended after age 92. ?Talk to your health  care provider about which screenings and vaccines  you need and how often you need them. ?This information is not intended to replace advice given to you by your health care provider. Make sure you discuss any questions you have with your health care provider. ?Document Released: 04/09/2015 Document Revised: 12/01/2015 Document Reviewed: 01/12/2015 ?Elsevier Interactive Patient Education ? 2017 [place]. ? ?Fall Prevention in the Home ?Falls can cause injuries. They can happen to people of all ages. There are many things you can do to make your home safe and to help prevent falls. ?What can I do on the outside of my home? ?Regularly fix the edges of walkways and driveways and fix any cracks. ?Remove anything that might make you trip as you walk through a door, such as a raised step or threshold. ?Trim any bushes or trees on the path to your home. ?Use bright outdoor lighting. ?Clear any walking paths of anything that might make someone trip, such as rocks or tools. ?Regularly check to see if handrails are loose or broken. Make sure that both sides of any steps have handrails. ?Any raised decks and porches should have guardrails on the edges. ?Have any leaves, snow, or ice cleared regularly. ?Use sand or salt on walking paths during winter. ?Clean up any spills in your garage right away. This includes oil or grease spills. ?What can I do in the bathroom? ?Use night lights. ?Install grab bars by the toilet and in the tub and shower. Do not use towel bars as grab bars. ?Use non-skid mats or decals in the tub or shower. ?If you need to sit down in the shower, use a plastic, non-slip stool. ?Keep the floor dry. Clean up any water that spills on the floor as soon as it happens. ?Remove soap buildup in the tub or shower regularly. ?Attach bath mats securely with double-sided non-slip rug tape. ?Do not have throw rugs and other things on the floor that can make you trip. ?What can I do in the bedroom? ?Use night lights. ?Make sure that you have a light by your bed that  is easy to reach. ?Do not use any sheets or blankets that are too big for your bed. They should not hang down onto the floor. ?Have a firm chair that has side arms. You can use this for support while you get dressed. ?Do not have throw rugs and other things on the floor that can make you trip. ?What can I do in the kitchen? ?Clean up any spills right away. ?Avoid walking on wet floors. ?Keep items that you use a lot in easy-to-reach places. ?If you need to reach something above you, use a strong step stool that has a grab bar. ?Keep electrical cords out of the way. ?Do not use floor polish or wax that makes floors slippery. If you must use wax, use non-skid floor wax. ?Do not have throw rugs and other things on the floor that can make you trip. ?What can I do with my stairs? ?Do not leave any items on the stairs. ?Make sure that there are handrails on both sides of the stairs and use them. Fix handrails that are broken or loose. Make sure that handrails are as long as the stairways. ?Check any carpeting to make sure that it is firmly attached to the stairs. Fix any carpet that is loose or worn. ?Avoid having throw rugs at the top or bottom of the stairs. If you do have throw rugs, attach them to  the floor with carpet tape. ?Make sure that you have a light switch at the top of the stairs and the bottom of the stairs. If you do not have them, ask someone to add them for you. ?What else can I do to help prevent falls? ?Wear shoes that: ?Do not have high heels. ?Have rubber bottoms. ?Are comfortable and fit you well. ?Are closed at the toe. Do not wear sandals. ?If you use a stepladder: ?Make sure that it is fully opened. Do not climb a closed stepladder. ?Make sure that both sides of the stepladder are locked into place. ?Ask someone to hold it for you, if possible. ?Clearly mark and make sure that you can see: ?Any grab bars or handrails. ?First and last steps. ?Where the edge of each step is. ?Use tools that help you  move around (mobility aids) if they are needed. These include: ?Canes. ?Walkers. ?Scooters. ?Crutches. ?Turn on the lights when you go into a dark area. Replace any light bulbs as soon as they burn out. ?

## 2021-07-29 NOTE — Telephone Encounter (Signed)
Requested Prescriptions  ?Pending Prescriptions Disp Refills  ?? pantoprazole (PROTONIX) 40 MG tablet [Pharmacy Med Name: Pantoprazole Sodium 40 MG Oral Tablet Delayed Release] 90 tablet 0  ?  Sig: TAKE 1 TABLET BY MOUTH ONCE DAILY AT  6AM  ?  ? Gastroenterology: Proton Pump Inhibitors Passed - 07/29/2021 11:53 AM  ?  ?  Passed - Valid encounter within last 12 months  ?  Recent Outpatient Visits   ?      ? 3 weeks ago Left sided sciatica  ? [place], DO  ? 5 months ago Type 2 diabetes mellitus with hyperglycemia, without long-term current use of insulin ([place])  ? [facility] [place], [place] W, NP  ? 7 months ago Essential hypertension  ? [facility] [place], [place] W, NP  ? 8 months ago Orthostasis  ? [facility] [place], [place] W, NP  ? 8 months ago Type 2 diabetes mellitus with hyperglycemia, without long-term current use of insulin ([place])  ? [facility] [place], Coralie Keens, NP  ?  ?  ?Future Appointments   ?        ? In 3 weeks Baity, Coralie Keens, NP [facility], [place]  ?  ? ?  ?  ?  ? ?

## 2021-08-15 ENCOUNTER — Other Ambulatory Visit: Payer: Self-pay | Admitting: Unknown Physician Specialty

## 2021-08-15 DIAGNOSIS — I10 Essential (primary) hypertension: Secondary | ICD-10-CM

## 2021-08-16 ENCOUNTER — Other Ambulatory Visit: Payer: Self-pay | Admitting: Unknown Physician Specialty

## 2021-08-16 ENCOUNTER — Other Ambulatory Visit: Payer: Self-pay | Admitting: Internal Medicine

## 2021-08-16 DIAGNOSIS — I10 Essential (primary) hypertension: Secondary | ICD-10-CM

## 2021-08-16 DIAGNOSIS — E119 Type 2 diabetes mellitus without complications: Secondary | ICD-10-CM

## 2021-08-16 NOTE — Telephone Encounter (Signed)
Requested medication (s) are due for refill today: yes  Requested medication (s) are on the active medication list: yes  Last refill:  11/25/20 #90/3  Future visit scheduled: yes  Notes to clinic:  historical med. Please advise      Requested Prescriptions  Pending Prescriptions Disp Refills   amLODipine (NORVASC) 5 MG tablet [Pharmacy Med Name: amLODIPine Besylate 5 MG Oral Tablet] 90 tablet 0    Sig: Take 1 tablet by mouth once daily     Cardiovascular: Calcium Channel Blockers 2 Passed - 08/15/2021 10:59 AM      Passed - Last BP in normal range    BP Readings from Last 1 Encounters:  07/08/21 136/68         Passed - Last Heart Rate in normal range    Pulse Readings from Last 1 Encounters:  07/08/21 63         Passed - Valid encounter within last 6 months    Recent Outpatient Visits           1 month ago Left sided sciatica   [facility] [place], Netta Neat, DO   5 months ago Type 2 diabetes mellitus with hyperglycemia, without long-term current use of insulin [facility])   [facility] [place], Salvadore Oxford, NP   8 months ago Essential hypertension   [facility] [place], Salvadore Oxford, NP   8 months ago Orthostasis   [facility] [place], [place] W, NP   9 months ago Type 2 diabetes mellitus with hyperglycemia, without long-term current use of insulin [facility])   [facility] [place], Salvadore Oxford, NP       Future Appointments             In 1 week Sampson Si, Salvadore Oxford, NP [facility]

## 2021-08-17 NOTE — Telephone Encounter (Signed)
Requested Prescriptions  Pending Prescriptions Disp Refills  . metFORMIN (GLUCOPHAGE) 500 MG tablet [Pharmacy Med Name: metFORMIN HCl 500 MG Oral Tablet] 60 tablet 0    Sig: TAKE 1 TABLET BY MOUTH TWICE DAILY WITH A MEAL     Endocrinology:  Diabetes - Biguanides Failed - 08/16/2021  3:42 PM      Failed - B12 Level in normal range and within 720 days    No results found for: VITAMINB12       Passed - Cr in normal range and within 360 days    Creat  Date Value Ref Range Status  02/24/2021 0.92 0.60 - 0.95 mg/dL Final         Passed - HBA1C is between 0 and 7.9 and within 180 days    Hgb A1c MFr Bld  Date Value Ref Range Status  02/24/2021 6.5 (H) <5.7 % of total Hgb Final    Comment:    For someone without known diabetes, a hemoglobin A1c value of 6.5% or greater indicates that they may have  diabetes and this should be confirmed with a follow-up  test. . For someone with known diabetes, a value <7% indicates  that their diabetes is well controlled and a value  greater than or equal to 7% indicates suboptimal  control. A1c targets should be individualized based on  duration of diabetes, age, comorbid conditions, and  other considerations. . Currently, no consensus exists regarding use of hemoglobin A1c for diagnosis of diabetes for children. .          Passed - eGFR in normal range and within 360 days    GFR, Est African American  Date Value Ref Range Status  11/18/2019 67 > OR = 60 mL/min/1.53m Final   GFR, Est Non African American  Date Value Ref Range Status  11/18/2019 58 (L) > OR = 60 mL/min/1.746mFinal   GFR, Estimated  Date Value Ref Range Status  11/22/2020 58 (L) >60 mL/min Final    Comment:    (NOTE) Calculated using the CKD-EPI Creatinine Equation (2021)    eGFR  Date Value Ref Range Status  02/24/2021 61 > OR = 60 mL/min/1.7375minal    Comment:    The eGFR is based on the CKD-EPI 2021 equation. To calculate  the new eGFR from a previous  Creatinine or Cystatin C result, go to https://www.kidney.org/professionals/ kdoqi/gfr%5Fcalculator          Passed - Valid encounter within last 6 months    Recent Outpatient Visits          1 month ago Left sided sciatica   [place]   5 months ago Type 2 diabetes mellitus with hyperglycemia, without long-term current use of insulin ([facility]   8 months ago Essential hypertension   [facility]   8 months ago [facility]   9 months ago Type 2 diabetes mellitus with hyperglycemia, without long-term current use of insulin ([facility]      Future Appointments            In 1 week BaiGarnette GunneregCoralie KeensP Sou[place]air[place] normal limits and completed in the last 12 months  WBC  Date Value Ref Range Status  02/24/2021 5.0 3.8 - 10.8 Thousand/uL Final   RBC  Date Value Ref Range Status  02/24/2021 3.97 3.80 - 5.10 Million/uL Final   Hemoglobin  Date Value Ref Range Status  02/24/2021 11.7 11.7 - 15.5 g/dL Final   HGB  Date Value Ref Range Status  04/09/2013 14.9 12.0 - 16.0 g/dL Final   HCT  Date Value Ref Range Status  02/24/2021 36.5 35.0 - 45.0 % Final  04/09/2013 44.4 35.0 - 47.0 % Final   MCHC  Date Value Ref Range Status  02/24/2021 32.1 32.0 - 36.0 g/dL Final   [facility]  Date Value Ref Range Status  02/24/2021 29.5 27.0 - 33.0 pg Final   MCV  Date Value Ref Range Status  02/24/2021 91.9 80.0 - 100.0 fL Final  04/09/2013 89 80 - 100 fL Final   No results found for: PLTCOUNTKUC, LABPLAT, POCPLA RDW  Date Value Ref Range Status  02/24/2021 12.2 11.0 - 15.0 % Final  04/09/2013 13.5 11.5 - 14.5 % Final         . losartan (COZAAR) 50 MG tablet [Pharmacy Med Name: Losartan Potassium 50 MG Oral  Tablet] 270 tablet 0    Sig: TAKE 1 TABLET BY MOUTH THREE TIMES DAILY WITH FOOD IN THE MORNING, AT NOON AND AT BEDTIME     Cardiovascular:  Angiotensin Receptor Blockers Passed - 08/16/2021  3:42 PM      Passed - Cr in normal range and within 180 days    Creat  Date Value Ref Range Status  02/24/2021 0.92 0.60 - 0.95 mg/dL Final         Passed - K in normal range and within 180 days    Potassium  Date Value Ref Range Status  02/24/2021 5.2 3.5 - 5.3 mmol/L Final  04/09/2013 3.8 3.5 - 5.1 mmol/L Final         Passed - Patient is not pregnant      Passed - Last BP in normal range    BP Readings from Last 1 Encounters:  07/08/21 136/68         Passed - Valid encounter within last 6 months    Recent Outpatient Visits          1 month ago Left sided sciatica   [place], DO   5 months ago Type 2 diabetes mellitus with hyperglycemia, without long-term current use of insulin [facility])   [facility], Coralie Keens, NP   8 months ago Essential hypertension   [place], Coralie Keens, NP   8 months ago [facility] [place], Coralie Keens, NP   9 months ago Type 2 diabetes mellitus with hyperglycemia, without long-term current use of insulin [facility])   [facility] Jearld Fenton, NP      Future Appointments            In 1 week Garnette Gunner, Coralie Keens, NP [facility]

## 2021-08-17 NOTE — Telephone Encounter (Signed)
Filled 08/16/21. Requested Prescriptions  Refused Prescriptions Disp Refills  . amLODipine (NORVASC) 5 MG tablet [Pharmacy Med Name: amLODIPine Besylate 5 MG Oral Tablet] 90 tablet 0    Sig: Take 1 tablet by mouth once daily     Cardiovascular: Calcium Channel Blockers 2 Passed - 08/16/2021  3:42 PM      Passed - Last BP in normal range    BP Readings from Last 1 Encounters:  07/08/21 136/68         Passed - Last Heart Rate in normal range    Pulse Readings from Last 1 Encounters:  07/08/21 63         Passed - Valid encounter within last 6 months    Recent Outpatient Visits          1 month ago Left sided sciatica   [facility] [place], Netta Neat, DO   5 months ago Type 2 diabetes mellitus with hyperglycemia, without long-term current use of insulin [facility])   [facility], Salvadore Oxford, NP   8 months ago Essential hypertension   [facility] [place], Salvadore Oxford, NP   8 months ago Orthostasis   [facility] [place], [place] W, NP   9 months ago Type 2 diabetes mellitus with hyperglycemia, without long-term current use of insulin [facility])   [facility] [place], Salvadore Oxford, NP      Future Appointments            In 1 week Sampson Si, Salvadore Oxford, NP [facility], [facility]

## 2021-08-17 NOTE — Telephone Encounter (Signed)
Requested medication (s) are due for refill today: yes  Requested medication (s) are on the active medication list: Dosage differs.    Last refill: 11/25/20 #90  2 refills  Future visit scheduled yes 08/25/21  Notes to clinic:Please review. Current med profile differs from refill request.  Requested Prescriptions  Pending Prescriptions Disp Refills   losartan (COZAAR) 50 MG tablet [Pharmacy Med Name: Losartan Potassium 50 MG Oral Tablet] 270 tablet 0    Sig: TAKE 1 TABLET BY MOUTH THREE TIMES DAILY WITH FOOD IN THE MORNING, AT NOON AND AT BEDTIME     Cardiovascular:  Angiotensin Receptor Blockers Passed - 08/16/2021  3:42 PM      Passed - Cr in normal range and within 180 days    Creat  Date Value Ref Range Status  02/24/2021 0.92 0.60 - 0.95 mg/dL Final         Passed - K in normal range and within 180 days    Potassium  Date Value Ref Range Status  02/24/2021 5.2 3.5 - 5.3 mmol/L Final  04/09/2013 3.8 3.5 - 5.1 mmol/L Final         Passed - Patient is not pregnant      Passed - Last BP in normal range    BP Readings from Last 1 Encounters:  07/08/21 136/68         Passed - Valid encounter within last 6 months    Recent Outpatient Visits           1 month ago Left sided sciatica   [place], DO   5 months ago Type 2 diabetes mellitus with hyperglycemia, without long-term current use of insulin ([place])   [facility] [place], Coralie Keens, NP   8 months ago Essential hypertension   [place], Coralie Keens, NP   8 months ago [facility] [place], Coralie Keens, NP   9 months ago Type 2 diabetes mellitus with hyperglycemia, without long-term current use of insulin ([place])   [facility] [place], Coralie Keens, NP       Future Appointments             In 1 week [place], Coralie Keens, NP [facility]             Signed Prescriptions Disp Refills    metFORMIN (GLUCOPHAGE) 500 MG tablet 60 tablet 0    Sig: TAKE 1 TABLET BY MOUTH TWICE DAILY WITH A MEAL     Endocrinology:  Diabetes - Biguanides Failed - 08/16/2021  3:42 PM      Failed - B12 Level in normal range and within 720 days    No results found for: VITAMINB12       Passed - Cr in normal range and within 360 days    Creat  Date Value Ref Range Status  02/24/2021 0.92 0.60 - 0.95 mg/dL Final         Passed - HBA1C is between 0 and 7.9 and within 180 days    Hgb A1c MFr Bld  Date Value Ref Range Status  02/24/2021 6.5 (H) <5.7 % of total Hgb Final    Comment:    For someone without known diabetes, a hemoglobin A1c value of 6.5% or greater indicates that they may have  diabetes and this should be confirmed with a follow-up  test. . For someone with known diabetes, a value <7%  indicates  that their diabetes is well controlled and a value  greater than or equal to 7% indicates suboptimal  control. A1c targets should be individualized based on  duration of diabetes, age, comorbid conditions, and  other considerations. . Currently, no consensus exists regarding use of hemoglobin A1c for diagnosis of diabetes for children. .          Passed - eGFR in normal range and within 360 days    GFR, Est African American  Date Value Ref Range Status  11/18/2019 67 > OR = 60 mL/min/1.11m Final   GFR, Est Non African American  Date Value Ref Range Status  11/18/2019 58 (L) > OR = 60 mL/min/1.714mFinal   GFR, Estimated  Date Value Ref Range Status  11/22/2020 58 (L) >60 mL/min Final    Comment:    (NOTE) Calculated using the CKD-EPI Creatinine Equation (2021)    eGFR  Date Value Ref Range Status  02/24/2021 61 > OR = 60 mL/min/1.739minal    Comment:    The eGFR is based on the CKD-EPI 2021 equation. To calculate  the new eGFR from a previous Creatinine or Cystatin C result, go to https://www.kidney.org/professionals/ kdoqi/gfr%5Fcalculator          Passed -  Valid encounter within last 6 months    Recent Outpatient Visits           1 month ago Left sided sciatica   [facility]   5 months ago Type 2 diabetes mellitus with hyperglycemia, without long-term current use of insulin ([facility] [facility]P   8 months ago Essential hypertension   [facility]   8 months ago [facility] Center[facility] NP   9 months ago Type 2 diabetes mellitus with hyperglycemia, without long-term current use of insulin (HCCWell[facility] SouGreen[facility] Rehabilitation HospitalegCoralie KeensP       Future Appointments             In 1 week BaiJearld FentonP SouThe [facility] Springsthin normal limits and completed in the last 12 months    WBC  Date Value Ref Range Status  02/24/2021 5.0 3.8 - 10.8 Thousand/uL Final   RBC  Date Value Ref Range Status  02/24/2021 3.97 3.80 - 5.10 Million/uL Final   Hemoglobin  Date Value Ref Range Status  02/24/2021 11.7 11.7 - 15.5 g/dL Final   HGB  Date Value Ref Range Status  04/09/2013 14.9 12.0 - 16.0 g/dL Final   HCT  Date Value Ref Range Status  02/24/2021 36.5 35.0 - 45.0 % Final  04/09/2013 44.4 35.0 - 47.0 % Final   MCHC  Date Value Ref Range Status  02/24/2021 32.1 32.0 - 36.0 g/dL Final   MCHPorterville Developmental Centerate Value Ref Range Status  02/24/2021 29.5 27.0 - 33.0 pg Final   MCV  Date Value Ref Range Status  02/24/2021 91.9 80.0 - 100.0 fL Final  04/09/2013 89 80 - 100 fL Final   No results found for: PLTCOUNTKUC, LABPLAT, POCPLA RDW  Date Value Ref Range Status  02/24/2021 12.2 11.0 - 15.0 % Final  04/09/2013 13.5 11.5 - 14.5 % Final

## 2021-08-24 LAB — HM DIABETES EYE EXAM

## 2021-08-25 ENCOUNTER — Ambulatory Visit
Admission: RE | Admit: 2021-08-25 | Discharge: 2021-08-25 | Disposition: A | Discharge status: Home / Self Care | Payer: PPO | Source: Ambulatory Visit | Attending: Internal Medicine | Admitting: Internal Medicine

## 2021-08-25 ENCOUNTER — Ambulatory Visit
Admission: RE | Admit: 2021-08-25 | Discharge: 2021-08-25 | Disposition: A | Discharge status: Home / Self Care | Payer: PPO | Attending: Internal Medicine | Admitting: Internal Medicine

## 2021-08-25 ENCOUNTER — Encounter: Payer: Self-pay | Admitting: Internal Medicine

## 2021-08-25 ENCOUNTER — Ambulatory Visit (INDEPENDENT_AMBULATORY_CARE_PROVIDER_SITE_OTHER): Payer: PPO | Admitting: Internal Medicine

## 2021-08-25 VITALS — BP 136/68 | HR 56 | Temp 96.8°F | Wt 171.0 lb

## 2021-08-25 DIAGNOSIS — Z6829 Body mass index (BMI) 29.0-29.9, adult: Secondary | ICD-10-CM

## 2021-08-25 DIAGNOSIS — M5432 Sciatica, left side: Secondary | ICD-10-CM | POA: Diagnosis not present

## 2021-08-25 DIAGNOSIS — Z0001 Encounter for general adult medical examination with abnormal findings: Secondary | ICD-10-CM | POA: Diagnosis not present

## 2021-08-25 DIAGNOSIS — E663 Overweight: Secondary | ICD-10-CM | POA: Diagnosis not present

## 2021-08-25 DIAGNOSIS — E1165 Type 2 diabetes mellitus with hyperglycemia: Secondary | ICD-10-CM | POA: Diagnosis not present

## 2021-08-25 DIAGNOSIS — M25552 Pain in left hip: Secondary | ICD-10-CM

## 2021-08-25 MED ORDER — LOSARTAN POTASSIUM 50 MG PO TABS: 50.0000 mg | ORAL_TABLET | Freq: Every day | ORAL | 0 refills | Status: DC

## 2021-08-25 NOTE — Patient Instructions (Signed)

## 2021-08-25 NOTE — Assessment & Plan Note (Signed)
Encourage diet and exercise for weight loss 

## 2021-08-25 NOTE — Progress Notes (Signed)
Subjective:    Patient ID: Margaret Mcintosh, female    DOB: Jan 24, 1933, 88 y.o.   MRN: [id number]  HPI  Patient presents to clinic today for her annual exam.  She also reports persistent left hip and left leg pain.  This started about 2 months ago.  The pain is worse with standing or walking. She denies numbness, tingling or weakness.  Flu: 11/2020 Tetanus: unsure COVID: Moderna x2 Pneumovax: 03/2013 Prevnar: 02/2016 Shingrix: Never Pap smear: Hysterectomy Mammogram: no longer screening Bone density: > 5 years ago Colon screening: > 10 years ago Vision screening: annually Dentist: as needed  Diet: She does eat meat. She consumes fruits and veggies. She does eat some fried foods. She drinks mostly water. Exercise: Walking  Review of Systems     Past Medical History:  Diagnosis Date   Arthritis    Cataract    Complication of anesthesia    "takes a long time to wake me up" (11/04/2015)   Diabetes mellitus (HCC)    GERD (gastroesophageal reflux disease)    Glaucoma    Hypertension    Stroke ([place])    last 8/17. Mild left arm/leg weakness   TIA (transient ischemic attack) 11/04/2015    Current Outpatient Medications  Medication Sig Dispense Refill   amLODipine (NORVASC) 5 MG tablet Take 1 tablet by mouth once daily 90 tablet 0   aspirin 325 MG EC tablet Take 1 tablet by mouth daily.     Cholecalciferol (VITAMIN D3 PO) Take by mouth.     Elastic Bandages & Supports (MEDICAL COMPRESSION SOCKS) MISC 2 Devices by Does not apply route daily. 2 each 0   FREESTYLE LITE test strip 1 each by Other route daily. Use to test blood sugar up to 4 times daily. 90 each 4   labetalol (NORMODYNE) 100 MG tablet Take 1 tablet (100 mg total) by mouth 3 (three) times daily. 270 tablet 1   losartan (COZAAR) 50 MG tablet TAKE 1 TABLET BY MOUTH THREE TIMES DAILY WITH FOOD IN THE MORNING, AT NOON AND AT BEDTIME 270 tablet 0   metFORMIN (GLUCOPHAGE) 500 MG tablet TAKE 1 TABLET BY MOUTH TWICE DAILY  WITH A MEAL 60 tablet 0   Multiple Vitamin (MULTIVITAMIN) tablet Take 1 tablet by mouth daily.     Multiple Vitamins-Minerals (GNP ONE DAILY MENS/LYCOPENE) TABS      Omega-3 Fatty Acids (FISH OIL) 1200 MG CAPS Take by mouth 3 (three) times daily.     pantoprazole (PROTONIX) 40 MG tablet TAKE 1 TABLET BY MOUTH ONCE DAILY AT  6AM 90 tablet 0   predniSONE (DELTASONE) 10 MG tablet Take 6 tabs with breakfast Day 1, 5 tabs Day 2, 4 tabs Day 3, 3 tabs Day 4, 2 tabs Day 5, 1 tab Day 6. (Patient not taking: Reported on 07/29/2021) 21 tablet 0   simvastatin (ZOCOR) 20 MG tablet TAKE 1 TABLET BY MOUTH AT BEDTIME 90 tablet 1   No current facility-administered medications for this visit.    Allergies  Allergen Reactions   Codeine Other (See Comments)    Gets nausea and feels like"something is crawling" "somethings crawling in my head"    Diphenhydramine Hcl Other (See Comments)    Feels like skin crawling "somethings crawling in my head"    Other Other (See Comments)    CANNOT HAVE ANY "SPICY" FOODS!!    Family History  Problem Relation Age of Onset   Hypertension Mother    Diabetes Mother  Heart disease Mother    Cancer Sister    Diabetes Brother    Hypertension Daughter    Other Son        Pituitary gland disease   Hypertension Son    Obesity Son    Colon cancer Brother    Diabetes Brother    Alzheimer's disease Brother    Diabetes Sister     Social History   Socioeconomic History   Marital status: Widowed    Spouse name: Not on file   Number of children: 2   Years of education: Not on file   Highest education level: Not on file  Occupational History   Occupation: Retired  Tobacco Use   Smoking status: Never   Smokeless tobacco: Never  Vaping Use   Vaping Use: Never used  Substance and Sexual Activity   Alcohol use: No   Drug use: No   Sexual activity: Not Currently  Other Topics Concern   Not on file  Social History Narrative   Not on file   Social Determinants  of Health   Financial Resource Strain: Low Risk    Difficulty of Paying Living Expenses: Not hard at all  Food Insecurity: No Food Insecurity   Worried About Charity fundraiser in the Last Year: Never true   [place] in the Last Year: Never true  Transportation Needs: No Transportation Needs   Lack of Transportation (Medical): No   Lack of Transportation (Non-Medical): No  Physical Activity: Insufficiently Active   Days of Exercise per Week: 2 days   Minutes of Exercise per Session: 20 min  Stress: No Stress Concern Present   Feeling of Stress : Not at all  Social Connections: Moderately Isolated   Frequency of Communication with Friends and Family: More than three times a week   Frequency of Social Gatherings with Friends and Family: Twice a week   Attends Religious Services: More than 4 times per year   Active Member of [employer] or Organizations: No   Attends Archivist Meetings: Never   Marital Status: Widowed  Human resources officer Violence: Not At Risk   Fear of Current or Ex-Partner: No   Emotionally Abused: No   Physically Abused: No   Sexually Abused: No     Constitutional: Denies fever, malaise, fatigue, headache or abrupt weight changes.  HEENT: Denies eye pain, eye redness, ear pain, ringing in the ears, wax buildup, runny nose, nasal congestion, bloody nose, or sore throat. Respiratory: Denies difficulty breathing, shortness of breath, cough or sputum production.   Cardiovascular: Denies chest pain, chest tightness, palpitations or swelling in the hands or feet.  Gastrointestinal: Denies abdominal pain, bloating, constipation, diarrhea or blood in the stool.  GU: Denies urgency, frequency, pain with urination, burning sensation, blood in urine, odor or discharge. Musculoskeletal: Patient reports left hip and left leg pain.  Denies decrease in range of motion, difficulty with gait, muscle pain or joint swelling.  Skin: Denies redness, rashes, lesions or  ulcercations.  Neurological: Patient reports intermittent lightheadedness.  Denies dizziness, difficulty with memory, difficulty with speech or problems with balance and coordination.  Psych: Denies anxiety, depression, SI/HI.  No other specific complaints in a complete review of systems (except as listed in HPI above).  Objective:   Physical Exam   BP 136/68 (BP Location: Right Arm, Patient Position: Sitting, Cuff Size: Large)   Pulse (!) 56   Temp (!) 96.8 F (36 C) (Temporal)   Wt 171 lb (77.6  kg)   SpO2 99%   BMI 29.35 kg/m   Wt Readings from Last 3 Encounters:  07/29/21 176 lb (79.8 kg)  07/08/21 176 lb 6.4 oz (80 kg)  02/24/21 173 lb (78.5 kg)    General: Appears her stated age, overweight, in NAD. Skin: Warm, dry and intact.  HEENT: Head: normal shape and size; Eyes: sclera white, PERRLA and EOMs intact;  Neck:  Neck supple, trachea midline. No masses, lumps or thyromegaly present.  Cardiovascular: Bradycardic with normal rhythm. S1,S2 noted.  No murmur, rubs or gallops noted. No JVD or BLE edema. No carotid bruits noted. Pulmonary/Chest: Normal effort and positive vesicular breath sounds. No respiratory distress. No wheezes, rales or ronchi noted.  Abdomen: Soft and nontender. Normal bowel sounds.  Musculoskeletal: Normal flexion, extension, rotation and lateral bending of the spine.  Bony tenderness noted over the lumbar spine.  Normal abduction, abduction and internal rotation of the left hip.  Decreased external rotation of the left hip.  Strength 5/5 BUE/BLE.  No difficulty with gait.  Neurological: Alert and oriented. Cranial nerves II-XII grossly intact. Coordination normal.  Psychiatric: Mood and affect normal. Behavior is normal. Judgment and thought content normal.     BMET    Component Value Date/Time   NA 141 02/24/2021 1337   NA 135 (L) 04/09/2013 1025   K 5.2 02/24/2021 1337   K 3.8 04/09/2013 1025   CL 104 02/24/2021 1337   CL 103 04/09/2013 1025    CO2 29 02/24/2021 1337   CO2 28 04/09/2013 1025   GLUCOSE 111 02/24/2021 1337   GLUCOSE 211 (H) 04/09/2013 1025   BUN 21 02/24/2021 1337   BUN 14 04/09/2013 1025   CREATININE 0.92 02/24/2021 1337   CALCIUM 10.0 02/24/2021 1337   CALCIUM 9.2 04/09/2013 1025   GFRNONAA 58 (L) 11/22/2020 0827   GFRNONAA 58 (L) 11/18/2019 1010   GFRAA 67 11/18/2019 1010    Lipid Panel     Component Value Date/Time   CHOL 146 02/24/2021 1337   TRIG 97 02/24/2021 1337   HDL 69 02/24/2021 1337   CHOLHDL 2.1 02/24/2021 1337   VLDL 23 11/05/2015 0549   LDLCALC 59 02/24/2021 1337    CBC    Component Value Date/Time   WBC 5.0 02/24/2021 1337   RBC 3.97 02/24/2021 1337   HGB 11.7 02/24/2021 1337   HGB 14.9 04/09/2013 1025   HCT 36.5 02/24/2021 1337   HCT 44.4 04/09/2013 1025   PLT 182 02/24/2021 1337   PLT 184 04/09/2013 1025   MCV 91.9 02/24/2021 1337   MCV 89 04/09/2013 1025   MCH 29.5 02/24/2021 1337   MCHC 32.1 02/24/2021 1337   RDW 12.2 02/24/2021 1337   RDW 13.5 04/09/2013 1025   LYMPHSABS 1.0 11/22/2020 0827   MONOABS 0.4 11/22/2020 0827   EOSABS 0.1 11/22/2020 0827   BASOSABS 0.0 11/22/2020 0827    Hgb A1C Lab Results  Component Value Date   HGBA1C 6.5 (H) 02/24/2021           Assessment & Plan:   Preventative Health Maintenance:  Encouraged her to get a flu shot in the fall She declines tetanus for financial reasons, advised her if she gets bit or cut to go at this time Encouraged her to get her COVID booster Pneumovax and Prevnar UTD Discussed Shingrix vaccine, she will check coverage with her insurance company and get this done at the pharmacy She no longer needs Pap smears She no longer wants to screen  for breast cancer or osteoporosis She no longer wants to screen for colon cancer Encouraged her to consume a balanced diet and exercise regimen Advised her to see an eye doctor and dentist annually We will check CBC, c-Met, lipid, A1c and urine microalbumin  today  Left Hip Pain, Left Side Sciatica:  X-ray lumbar spine X-ray left hip  We will follow-up after labs and imaging with further recommendation and treatment plan, RTC in 6 months, follow-up chronic conditions Webb Silversmith, NP

## 2021-08-26 LAB — COMPLETE METABOLIC PANEL WITH GFR
AG Ratio: 1.9 (calc) (ref 1.0–2.5)
ALT: 10 U/L (ref 6–29)
AST: 14 U/L (ref 10–35)
Albumin: 4.4 g/dL (ref 3.6–5.1)
Alkaline phosphatase (APISO): 56 U/L (ref 37–153)
BUN/Creatinine Ratio: 21 (calc) (ref 6–22)
BUN: 23 mg/dL (ref 7–25)
CO2: 28 mmol/L (ref 20–32)
Calcium: 10.5 mg/dL — ABNORMAL HIGH (ref 8.6–10.4)
Chloride: 103 mmol/L (ref 98–110)
Creat: 1.12 mg/dL — ABNORMAL HIGH (ref 0.60–0.95)
Globulin: 2.3 g/dL (calc) (ref 1.9–3.7)
Glucose, Bld: 120 mg/dL — ABNORMAL HIGH (ref 65–99)
Potassium: 4.4 mmol/L (ref 3.5–5.3)
Sodium: 142 mmol/L (ref 135–146)
Total Bilirubin: 0.6 mg/dL (ref 0.2–1.2)
Total Protein: 6.7 g/dL (ref 6.1–8.1)
eGFR: 48 mL/min/{1.73_m2} — ABNORMAL LOW (ref >=60)

## 2021-08-26 LAB — MICROALBUMIN / CREATININE URINE RATIO
Creatinine, Urine: 102 mg/dL (ref 20–275)
Microalb Creat Ratio: 52 mcg/mg creat — ABNORMAL HIGH (ref <30)
Microalb, Ur: 5.3 mg/dL

## 2021-08-26 LAB — CBC
HCT: 35.2 % (ref 35.0–45.0)
Hemoglobin: 11.1 g/dL — ABNORMAL LOW (ref 11.7–15.5)
MCH: 29.2 pg (ref 27.0–33.0)
MCHC: 31.5 g/dL — ABNORMAL LOW (ref 32.0–36.0)
MCV: 92.6 fL (ref 80.0–100.0)
MPV: 11.9 fL (ref 7.5–12.5)
Platelets: 177 10*3/uL (ref 140–400)
RBC: 3.8 10*6/uL (ref 3.80–5.10)
RDW: 13 % (ref 11.0–15.0)
WBC: 4.1 10*3/uL (ref 3.8–10.8)

## 2021-08-26 LAB — LIPID PANEL
Cholesterol: 133 mg/dL (ref <200)
HDL: 56 mg/dL (ref >=50)
LDL Cholesterol (Calc): 60 mg/dL (calc)
Non-HDL Cholesterol (Calc): 77 mg/dL (calc) (ref <130)
Total CHOL/HDL Ratio: 2.4 (calc) (ref <5.0)
Triglycerides: 85 mg/dL (ref <150)

## 2021-08-26 LAB — HEMOGLOBIN A1C
Hgb A1c MFr Bld: 6.2 % of total Hgb — ABNORMAL HIGH (ref <5.7)
Mean Plasma Glucose: 131 mg/dL
eAG (mmol/L): 7.3 mmol/L

## 2021-09-21 ENCOUNTER — Other Ambulatory Visit: Payer: Self-pay | Admitting: Internal Medicine

## 2021-09-21 DIAGNOSIS — E119 Type 2 diabetes mellitus without complications: Secondary | ICD-10-CM

## 2021-09-21 NOTE — Telephone Encounter (Signed)
Requested Prescriptions  Pending Prescriptions Disp Refills  . metFORMIN (GLUCOPHAGE) 500 MG tablet [Pharmacy Med Name: metFORMIN HCl 500 MG Oral Tablet] 60 tablet 0    Sig: TAKE 1 TABLET BY MOUTH TWICE DAILY WITH A MEAL     Endocrinology:  Diabetes - Biguanides Failed - 09/21/2021 10:31 AM      Failed - Cr in normal range and within 360 days    Creat  Date Value Ref Range Status  08/25/2021 1.12 (H) 0.60 - 0.95 mg/dL Final   Creatinine, Urine  Date Value Ref Range Status  08/25/2021 102 20 - 275 mg/dL Final         Failed - eGFR in normal range and within 360 days    GFR, Est African American  Date Value Ref Range Status  11/18/2019 67 > OR = 60 mL/min/1.68m2 Final   GFR, Est Non African American  Date Value Ref Range Status  11/18/2019 58 (L) > OR = 60 mL/min/1.49m2 Final   GFR, Estimated  Date Value Ref Range Status  11/22/2020 58 (L) >60 mL/min Final    Comment:    (NOTE) Calculated using the CKD-EPI Creatinine Equation (2021)    eGFR  Date Value Ref Range Status  08/25/2021 48 (L) > OR = 60 mL/min/1.52m2 Final    Comment:    The eGFR is based on the CKD-EPI 2021 equation. To calculate  the new eGFR from a previous Creatinine or Cystatin C result, go to https://www.kidney.org/professionals/ kdoqi/gfr%5Fcalculator          Failed - B12 Level in normal range and within 720 days    No results found for: "VITAMINB12"       Passed - HBA1C is between 0 and 7.9 and within 180 days    Hgb A1c MFr Bld  Date Value Ref Range Status  08/25/2021 6.2 (H) <5.7 % of total Hgb Final    Comment:    For someone without known diabetes, a hemoglobin  A1c value between 5.7% and 6.4% is consistent with prediabetes and should be confirmed with a  follow-up test. . For someone with known diabetes, a value <7% indicates that their diabetes is well controlled. A1c targets should be individualized based on duration of diabetes, age, comorbid conditions, and  other considerations. . This assay result is consistent with an increased risk of diabetes. . Currently, no consensus exists regarding use of hemoglobin A1c for diagnosis of diabetes for children. Renella Cunas - Valid encounter within last 6 months    Recent Outpatient Visits          3 weeks ago Encounter for general adult medical examination with abnormal findings   [facility] [place], Coralie Keens, NP   2 months ago Left sided sciatica   [place], DO   6 months ago Type 2 diabetes mellitus with hyperglycemia, without long-term current use of insulin [facility])   [facility], Coralie Keens, NP   9 months ago Essential hypertension   [place], Coralie Keens, NP   10 months ago [facility] [place], Coralie Keens, NP      Future Appointments            In 5 months Baity, Coralie Keens, NP [place] within normal limits and  completed in the last 12 months    WBC  Date Value Ref Range Status  08/25/2021 4.1 3.8 - 10.8 Thousand/uL Final   RBC  Date Value Ref Range Status  08/25/2021 3.80 3.80 - 5.10 Million/uL Final   Hemoglobin  Date Value Ref Range Status  08/25/2021 11.1 (L) 11.7 - 15.5 g/dL Final   HGB  Date Value Ref Range Status  04/09/2013 14.9 12.0 - 16.0 g/dL Final   HCT  Date Value Ref Range Status  08/25/2021 35.2 35.0 - 45.0 % Final  04/09/2013 44.4 35.0 - 47.0 % Final   MCHC  Date Value Ref Range Status  08/25/2021 31.5 (L) 32.0 - 36.0 g/dL Final   [facility]  Date Value Ref Range Status  08/25/2021 29.2 27.0 - 33.0 pg Final   MCV  Date Value Ref Range Status  08/25/2021 92.6 80.0 - 100.0 fL Final  04/09/2013 89 80 - 100 fL Final   No results found for: "PLTCOUNTKUC", "LABPLAT", "POCPLA" RDW  Date Value Ref Range Status  08/25/2021 13.0 11.0 - 15.0 % Final  04/09/2013 13.5 11.5 - 14.5 %  Final

## 2021-10-11 IMAGING — MR MR HEAD W/O CM
9 series · 48 of 48 positions shown · non-contrast
Comparison: Prior head CT examinations 11/22/2020 and earlier.
MRI/MRA head 11/04/2015.

CLINICAL DATA: Neuro deficit, acute, stroke suspected. Additional
history provided: Patient reports dizziness and unsteady gait.

EXAM:
MRI HEAD WITHOUT CONTRAST
TECHNIQUE: Multiplanar, multiecho pulse sequences of the brain and surrounding
structures were obtained without intravenous contrast.

[Series 5: ax dwi_tracew · axial · 3.0mm · 0.65mm/px · z∈[-47,+103]mm · 4 of 48 slices shown]
[im 1/48]
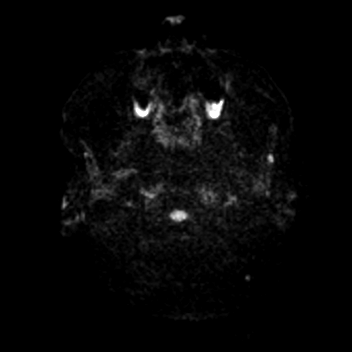
[im 16/48]
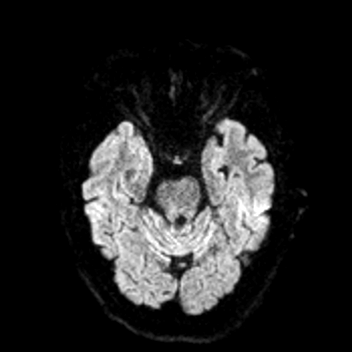
[im 32/48]
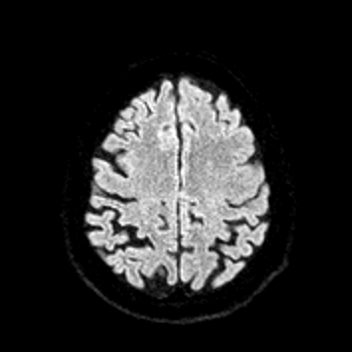
[im 48/48]
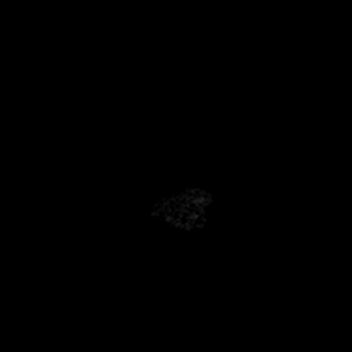

[Series 6: ax dwi_adc · axial · 3.0mm · 0.65mm/px · z∈[-47,+103]mm · 4 of 48 slices shown]
[im 1/48]
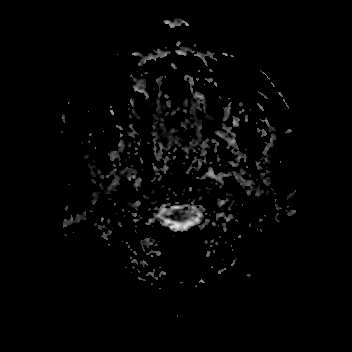
[im 16/48]
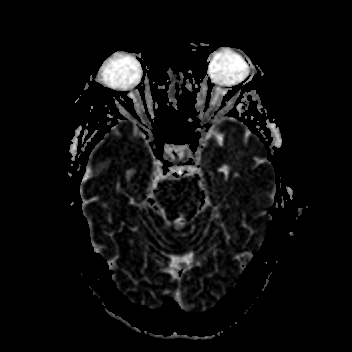
[im 32/48]
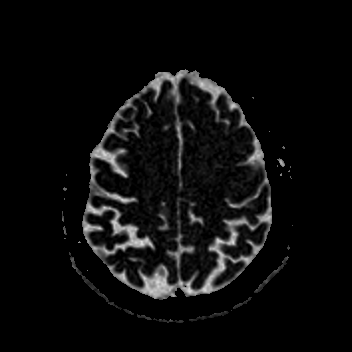
[im 48/48]
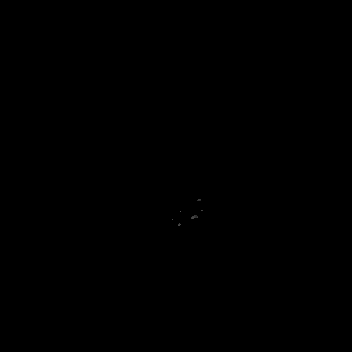

[Series 8: cor dwi_adc · coronal · 5.0mm · 0.65mm/px · 4 of 40 slices shown]
[im 1/40]
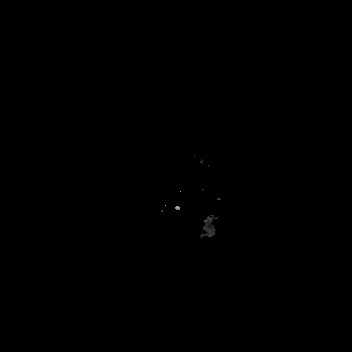
[im 14/40]
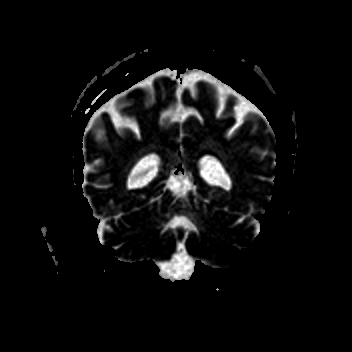
[im 27/40]
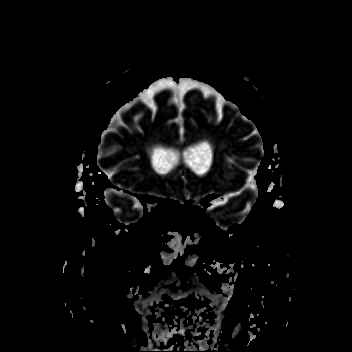
[im 40/40]
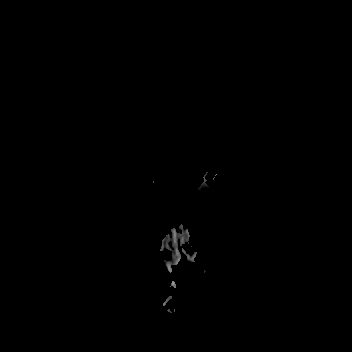

[Series 10: T2 · axial · 5.0mm · 0.53mm/px · z∈[-43,+95]mm · 2 of 25 slices shown (1 of 2)]
[im 1/25]
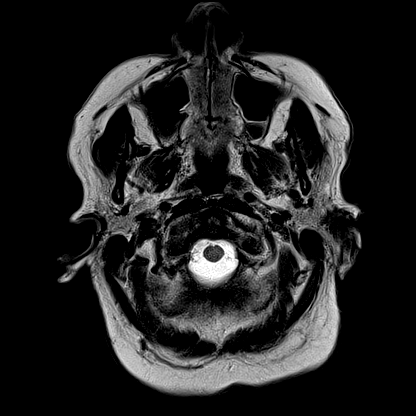
[im 25/25]
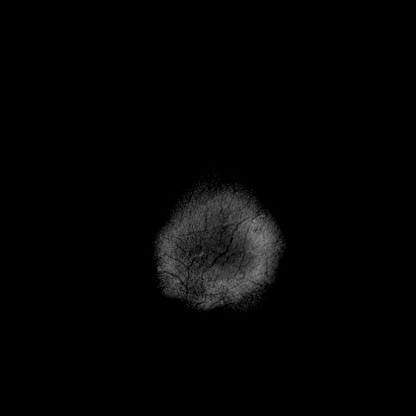

[Series 11: mag_images · axial · 3.0mm · 0.90mm/px · z∈[-58,+112]mm · 5 of 60 slices shown]
[im 1/60]
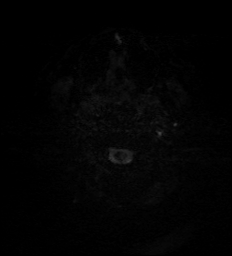
[im 15/60]
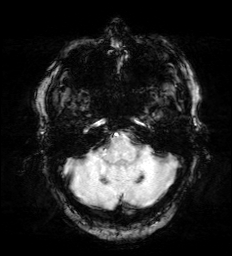
[im 30/60]
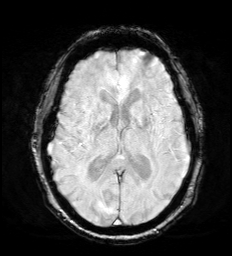
[im 45/60]
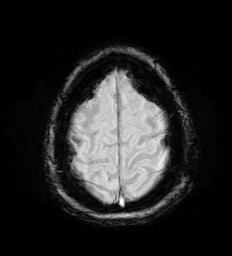
[im 60/60]
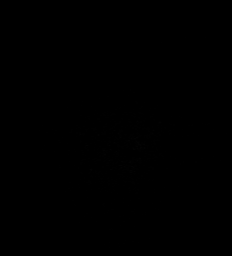

[Series 12: pha_images · axial · 3.0mm · 0.90mm/px · z∈[-58,+112]mm · 5 of 58 slices shown]
[im 1/58]
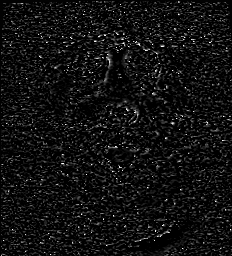
[im 15/58]
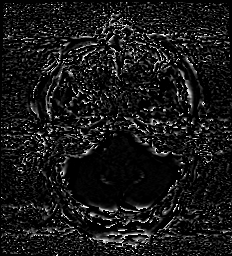
[im 29/58]
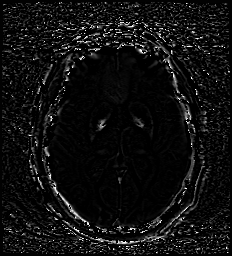
[im 43/58]
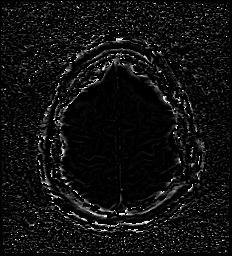
[im 58/58]
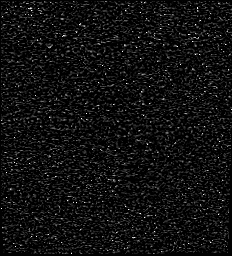

[Series 13: swi_images · axial · 3.0mm · 0.90mm/px · z∈[-58,+112]mm · 5 of 60 slices shown]
[im 1/60]
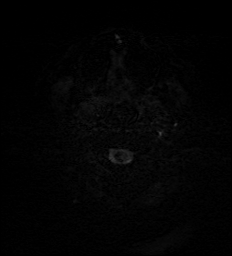
[im 15/60]
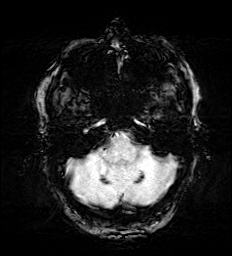
[im 30/60]
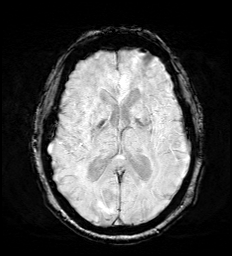
[im 45/60]
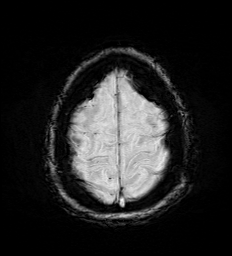
[im 60/60]
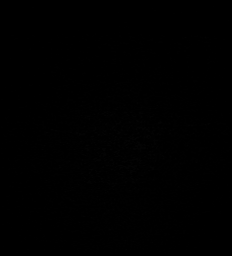

[Series 16: T1 · axial · 1.0mm · 0.98mm/px · z∈[-54,+114]mm · 16 of 176 slices shown]
[im 1/176]
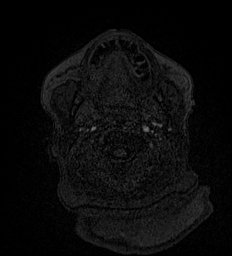
[im 12/176]
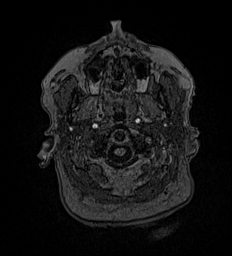
[im 24/176]
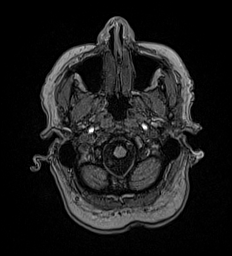
[im 36/176]
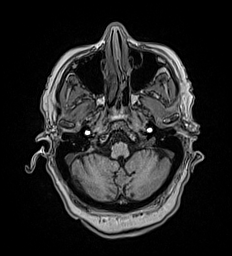
[im 47/176]
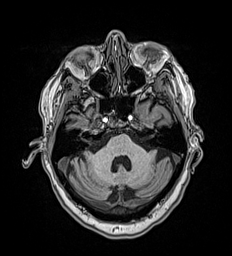
[im 59/176]
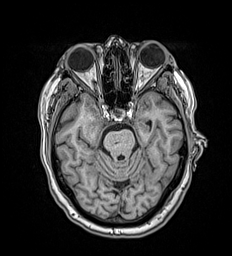
[im 71/176]
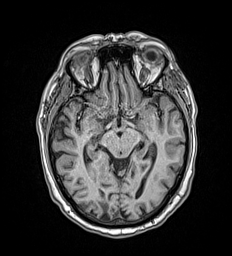
[im 82/176]
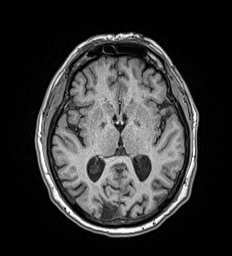
[im 94/176]
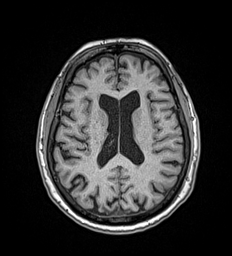
[im 106/176]
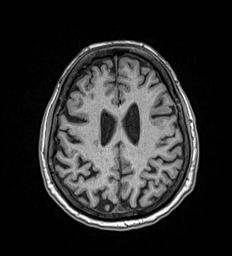
[im 117/176]
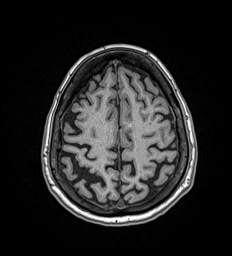
[im 129/176]
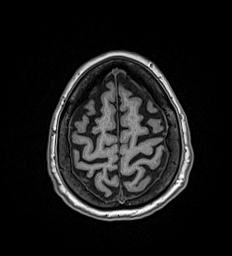
[im 141/176]
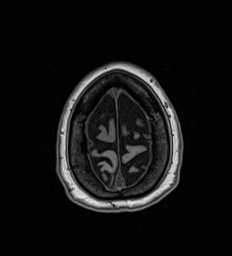
[im 152/176]
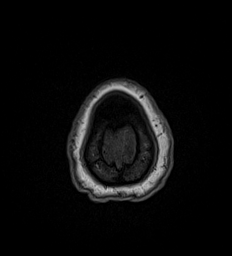
[im 164/176]
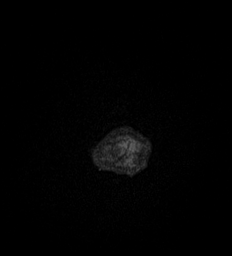
[im 176/176]
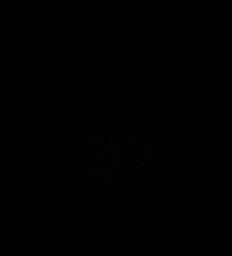

[Series 17: T2 · coronal · 5.0mm · 0.57mm/px · 3 of 29 slices shown (2 of 2)]
[im 1/29]
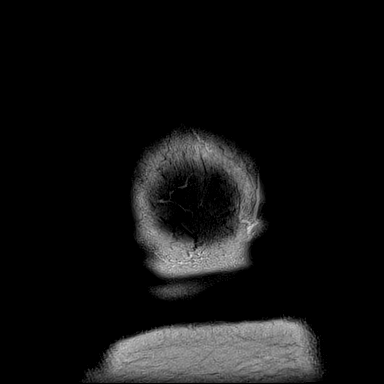
[im 15/29]
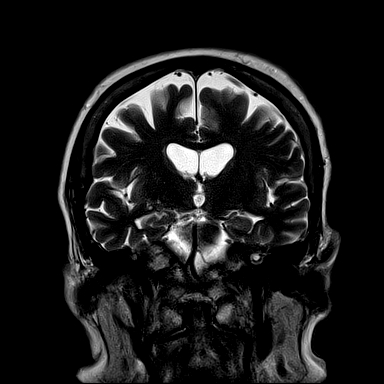
[im 29/29]
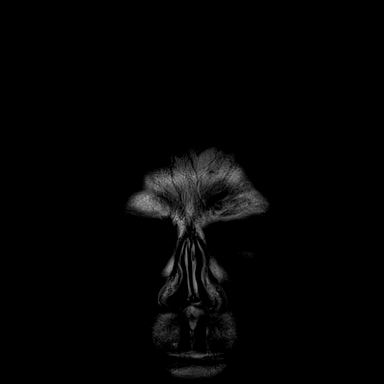

[48 of 48 positions shown; findings below may reference images not displayed]

FINDINGS: Brain:

Mild generalized cerebral and cerebellar atrophy.

Redemonstrated chronic cortical/subcortical infarct within the right
occipital lobe (PCA vascular territory). Mild associated chronic
blood products at this site.

Redemonstrated chronic infarcts within the right corona radiata and
basal ganglia (acute at time of the prior brain MRI on 11/04/2015).

Background mild multifocal T2/FLAIR hyperintensity within the
cerebral white matter, nonspecific but compatible with chronic small
vessel ischemic disease.

Redemonstrated chronic lacunar infarct within the right pons.

There is no acute infarct.

No evidence of an intracranial mass.

No extra-axial fluid collection.

No midline shift.

Vascular: Maintained flow voids within the proximal large arterial
vessels.

Skull and upper cervical spine: No focal suspicious marrow lesion.

Sinuses/Orbits: Visualized orbits show no acute finding. Bilateral
lens replacements. No significant paranasal sinus disease at the
imaged levels.
IMPRESSION: No evidence of acute intracranial abnormality.

Redemonstrated chronic cortical/subcortical infarct within the right
occipital lobe (PCA vascular territory).

Redemonstrated chronic lacunar infarcts within the right corona
radiata, right basal ganglia and right pons.

Background mild chronic small vessel ischemic changes within the
cerebral white matter.

Mild generalized parenchymal atrophy.

## 2021-11-22 ENCOUNTER — Other Ambulatory Visit: Payer: Self-pay | Admitting: Internal Medicine

## 2021-11-22 DIAGNOSIS — I10 Essential (primary) hypertension: Secondary | ICD-10-CM

## 2021-11-22 DIAGNOSIS — K219 Gastro-esophageal reflux disease without esophagitis: Secondary | ICD-10-CM

## 2021-11-23 NOTE — Telephone Encounter (Signed)
Requested Prescriptions  Pending Prescriptions Disp Refills  . amLODipine (NORVASC) 5 MG tablet [Pharmacy Med Name: amLODIPine Besylate 5 MG Oral Tablet] 90 tablet 0    Sig: Take 1 tablet by mouth once daily     Cardiovascular: Calcium Channel Blockers 2 Passed - 11/22/2021  2:50 PM      Passed - Last BP in normal range    BP Readings from Last 1 Encounters:  08/25/21 136/68         Passed - Last Heart Rate in normal range    Pulse Readings from Last 1 Encounters:  08/25/21 (!) 56         Passed - Valid encounter within last 6 months    Recent Outpatient Visits          3 months ago Encounter for general adult medical examination with abnormal findings   [facility] [place], Salvadore Oxford, NP   4 months ago Left sided sciatica   [facility] [place], Netta Neat, DO   9 months ago Type 2 diabetes mellitus with hyperglycemia, without long-term current use of insulin [facility])   [facility], Salvadore Oxford, NP   11 months ago Essential hypertension   [facility] [place], Salvadore Oxford, NP   12 months ago Orthostasis   [facility] [place], Salvadore Oxford, NP      Future Appointments            In 3 months Baity, Salvadore Oxford, NP [facility]           . pantoprazole (PROTONIX) 40 MG tablet [Pharmacy Med Name: Pantoprazole Sodium 40 MG Oral Tablet Delayed Release] 90 tablet 2    Sig: TAKE 1 TABLET BY MOUTH ONCE DAILY AT 6AM     Gastroenterology: Proton Pump Inhibitors Passed - 11/22/2021  2:50 PM      Passed - Valid encounter within last 12 months    Recent Outpatient Visits          3 months ago Encounter for general adult medical examination with abnormal findings   [facility] [employer], Salvadore Oxford, NP   4 months ago Left sided sciatica   [facility] [place], Netta Neat, DO   9 months ago Type 2 diabetes mellitus with hyperglycemia, without long-term current use of  insulin [facility])   [facility], Salvadore Oxford, NP   11 months ago Essential hypertension   [facility] [place], Salvadore Oxford, NP   12 months ago Orthostasis   [facility] [place], Salvadore Oxford, NP      Future Appointments            In 3 months Baity, Salvadore Oxford, NP [facility], [facility]

## 2022-02-11 ENCOUNTER — Other Ambulatory Visit: Payer: Self-pay | Admitting: Internal Medicine

## 2022-02-11 DIAGNOSIS — E782 Mixed hyperlipidemia: Secondary | ICD-10-CM

## 2022-02-13 NOTE — Telephone Encounter (Signed)
Requested Prescriptions  Pending Prescriptions Disp Refills   simvastatin (ZOCOR) 20 MG tablet [Pharmacy Med Name: Simvastatin 20 MG Oral Tablet] 90 tablet 2    Sig: TAKE 1 TABLET BY MOUTH AT BEDTIME     Cardiovascular:  Antilipid - Statins Failed - 02/11/2022 11:34 AM      Failed - Lipid Panel in normal range within the last 12 months    Cholesterol  Date Value Ref Range Status  08/25/2021 133 <200 mg/dL Final   LDL Cholesterol (Calc)  Date Value Ref Range Status  08/25/2021 60 mg/dL (calc) Final    Comment:    Reference range: <100 . Desirable range <100 mg/dL for primary prevention;   <70 mg/dL for patients with CHD or diabetic patients  with > or = 2 CHD risk factors. Marland Kitchen LDL-C is now calculated using the Martin-Hopkins  calculation, which is a validated novel method providing  better accuracy than the Friedewald equation in the  estimation of LDL-C.  Horald Pollen et al. Lenox Ahr. 3664;403(47): 2061-2068  (http://education.[employer].com/faq/FAQ164)    HDL  Date Value Ref Range Status  08/25/2021 56 > OR = 50 mg/dL Final   Triglycerides  Date Value Ref Range Status  08/25/2021 85 <150 mg/dL Final         Passed - Patient is not pregnant      Passed - Valid encounter within last 12 months    Recent Outpatient Visits           5 months ago Encounter for general adult medical examination with abnormal findings   [facility] [place], Salvadore Oxford, NP   7 months ago Left sided sciatica   [facility] Smitty Cords, DO   11 months ago Type 2 diabetes mellitus with hyperglycemia, without long-term current use of insulin [facility])   [facility] [place], Salvadore Oxford, NP   1 year ago Essential hypertension   [facility] [place], Salvadore Oxford, NP   1 year ago Orthostasis   [facility] [place], Salvadore Oxford, NP       Future Appointments             In 2 weeks Sampson Si, Salvadore Oxford, NP [facility], [facility]

## 2022-02-14 ENCOUNTER — Other Ambulatory Visit: Payer: Self-pay | Admitting: Internal Medicine

## 2022-02-14 DIAGNOSIS — I10 Essential (primary) hypertension: Secondary | ICD-10-CM

## 2022-02-14 NOTE — Telephone Encounter (Signed)
Requested Prescriptions  Pending Prescriptions Disp Refills   labetalol (NORMODYNE) 100 MG tablet [Pharmacy Med Name: Labetalol HCl 100 MG Oral Tablet] 270 tablet 0    Sig: TAKE 1 TABLET BY MOUTH THREE TIMES DAILY     Cardiovascular:  Beta Blockers Passed - 02/14/2022  2:17 PM      Passed - Last BP in normal range    BP Readings from Last 1 Encounters:  08/25/21 136/68         Passed - Last Heart Rate in normal range    Pulse Readings from Last 1 Encounters:  08/25/21 (!) 56         Passed - Valid encounter within last 6 months    Recent Outpatient Visits           5 months ago Encounter for general adult medical examination with abnormal findings   [facility] [place], Salvadore Oxford, NP   7 months ago Left sided sciatica   [facility] Smitty Cords, DO   11 months ago Type 2 diabetes mellitus with hyperglycemia, without long-term current use of insulin [facility])   [facility] [place], Salvadore Oxford, NP   1 year ago Essential hypertension   [facility] [place], Salvadore Oxford, NP   1 year ago Orthostasis   [facility] [place], Salvadore Oxford, NP       Future Appointments             In 1 week Sampson Si, Salvadore Oxford, NP [facility], [facility]

## 2022-02-14 NOTE — Telephone Encounter (Signed)
Requested Prescriptions  Pending Prescriptions Disp Refills   amLODipine (NORVASC) 5 MG tablet [Pharmacy Med Name: amLODIPine Besylate 5 MG Oral Tablet] 90 tablet 0    Sig: Take 1 tablet by mouth once daily     Cardiovascular: Calcium Channel Blockers 2 Passed - 02/14/2022  1:47 PM      Passed - Last BP in normal range    BP Readings from Last 1 Encounters:  08/25/21 136/68         Passed - Last Heart Rate in normal range    Pulse Readings from Last 1 Encounters:  08/25/21 (!) 56         Passed - Valid encounter within last 6 months    Recent Outpatient Visits           5 months ago Encounter for general adult medical examination with abnormal findings   [facility] [place], Salvadore Oxford, NP   7 months ago Left sided sciatica   [facility] Smitty Cords, DO   11 months ago Type 2 diabetes mellitus with hyperglycemia, without long-term current use of insulin [facility])   [facility] [place], Salvadore Oxford, NP   1 year ago Essential hypertension   [facility] [place], Salvadore Oxford, NP   1 year ago Orthostasis   [facility], Salvadore Oxford, NP       Future Appointments             In 1 week Sampson Si, Salvadore Oxford, NP [facility], [facility]

## 2022-02-27 ENCOUNTER — Telehealth: Payer: Self-pay | Admitting: Internal Medicine

## 2022-02-27 ENCOUNTER — Ambulatory Visit (INDEPENDENT_AMBULATORY_CARE_PROVIDER_SITE_OTHER): Payer: PPO | Admitting: Internal Medicine

## 2022-02-27 ENCOUNTER — Encounter: Payer: Self-pay | Admitting: Internal Medicine

## 2022-02-27 VITALS — BP 144/68 | HR 67 | Temp 97.1°F | Wt 173.0 lb

## 2022-02-27 DIAGNOSIS — E1165 Type 2 diabetes mellitus with hyperglycemia: Secondary | ICD-10-CM | POA: Diagnosis not present

## 2022-02-27 DIAGNOSIS — I10 Essential (primary) hypertension: Secondary | ICD-10-CM | POA: Diagnosis not present

## 2022-02-27 DIAGNOSIS — E782 Mixed hyperlipidemia: Secondary | ICD-10-CM

## 2022-02-27 DIAGNOSIS — Z6829 Body mass index (BMI) 29.0-29.9, adult: Secondary | ICD-10-CM

## 2022-02-27 DIAGNOSIS — I6930 Unspecified sequelae of cerebral infarction: Secondary | ICD-10-CM

## 2022-02-27 DIAGNOSIS — K219 Gastro-esophageal reflux disease without esophagitis: Secondary | ICD-10-CM | POA: Diagnosis not present

## 2022-02-27 DIAGNOSIS — Z23 Encounter for immunization: Secondary | ICD-10-CM

## 2022-02-27 DIAGNOSIS — E663 Overweight: Secondary | ICD-10-CM

## 2022-02-27 DIAGNOSIS — N1831 Chronic kidney disease, stage 3a: Secondary | ICD-10-CM

## 2022-02-27 LAB — POCT GLYCOSYLATED HEMOGLOBIN (HGB A1C): Hemoglobin A1C: 6.5 % — AB (ref 4.0–5.6)

## 2022-02-27 NOTE — Assessment & Plan Note (Signed)
Try to avoid foods that trigger reflux Encourage weight also as this can help reduce reflux symptoms Continue pantoprazole

## 2022-02-27 NOTE — Assessment & Plan Note (Signed)
Continue losartan for renal protection C-Met today 

## 2022-02-27 NOTE — Assessment & Plan Note (Signed)
Elevated today, discussed goal of 120/80 Continue amlodipine and losartan Increase labetalol to 100 mg 2 times daily Reinforced DASH diet and exercise for weight loss C-Met today

## 2022-02-27 NOTE — Patient Instructions (Signed)
How to Take Your Blood Pressure Blood pressure measures how strongly your blood is pressing against the walls of your arteries. Arteries are blood vessels that carry blood from your heart throughout your body. You can take your blood pressure at home with a machine. You may need to check your blood pressure at home: To check if you have high blood pressure (hypertension). To check your blood pressure over time. To make sure your blood pressure medicine is working. Supplies needed: Blood pressure machine, or monitor. A chair to sit in. This should be a chair where you can sit upright with your back supported. Do not sit on a soft couch or an armchair. Table or desk. Small notebook. Pencil or pen. How to prepare Avoid these things for 30 minutes before checking your blood pressure: Having drinks with caffeine in them, such as coffee or tea. Drinking alcohol. Eating. Smoking. Exercising. Do these things five minutes before checking your blood pressure: Go to the bathroom and pee (urinate). Sit in a chair. Be quiet. Do not talk. How to take your blood pressure Follow the instructions that came with your machine. If you have a digital blood pressure monitor, these may be the instructions: Sit up straight. Place your feet on the floor. Do not cross your ankles or legs. Rest your left arm at the level of your heart. You may rest it on a table, desk, or chair. Pull up your shirt sleeve. Wrap the blood pressure cuff around the upper part of your left arm. The cuff should be 1 inch (2.5 cm) above your elbow. It is best to wrap the cuff around bare skin. Fit the cuff snugly around your arm, but not too tightly. You should be able to place only one finger between the cuff and your arm. Place the cord so that it rests in the bend of your elbow. Press the power button. Sit quietly while the cuff fills with air and loses air. Write down the numbers on the screen. Wait 2-3 minutes and then repeat  steps 1-10. What do the numbers mean? Two numbers make up your blood pressure. The first number is called systolic pressure. The second is called diastolic pressure. An example of a blood pressure reading is "120 over 80" (or 120/80). If you are an adult and do not have a medical condition, use this guide to find out if your blood pressure is normal: Normal First number: below 120. Second number: below 80. Elevated First number: 120-129. Second number: below 80. Hypertension stage 1 First number: 130-139. Second number: 80-89. Hypertension stage 2 First number: 140 or above. Second number: 90 or above. Your blood pressure is above normal even if only the first or only the second number is above normal. Follow these instructions at home: Medicines Take over-the-counter and prescription medicines only as told by your doctor. Tell your doctor if your medicine is causing side effects. General instructions Check your blood pressure as often as your doctor tells you to. Check your blood pressure at the same time every day. Take your monitor to your next doctor's appointment. Your doctor will: Make sure you are using it correctly. Make sure it is working right. Understand what your blood pressure numbers should be. Keep all follow-up visits. General tips You will need a blood pressure machine or monitor. Your doctor can suggest a monitor. You can buy one at a drugstore or online. When choosing one: Choose one with an arm cuff. Choose one that wraps around your   upper arm. Only one finger should fit between your arm and the cuff. Do not choose one that measures your blood pressure from your wrist or finger. Where to find more information American Heart Association: www.heart.org Contact a doctor if: Your blood pressure keeps being high. Your blood pressure is suddenly low. Get help right away if: Your first blood pressure number is higher than 180. Your second blood pressure number is  higher than 120. These symptoms may be an emergency. Do not wait to see if the symptoms will go away. Get help right away. Call 911. Summary Check your blood pressure at the same time every day. Avoid caffeine, alcohol, smoking, and exercise for 30 minutes before checking your blood pressure. Make sure you understand what your blood pressure numbers should be. This information is not intended to replace advice given to you by your health care provider. Make sure you discuss any questions you have with your health care provider. Document Revised: 11/25/2020 Document Reviewed: 11/25/2020 Elsevier Patient Education  2023 Elsevier Inc.  

## 2022-02-27 NOTE — Assessment & Plan Note (Signed)
Encourage diet and exercise for weight loss 

## 2022-02-27 NOTE — Assessment & Plan Note (Signed)
POCT A1c 6.5% Urine microalbumin has been checked within the last year Continue metformin Encourage low-carb diet and exercise for weight loss Encourage routine eye exam Encourage routine foot exam Flu shot UTD Pneumovax and Prevnar UTD Encouraged her to get her COVID booster

## 2022-02-27 NOTE — Telephone Encounter (Unsigned)
Pt is to let Rene Kocher know about these meds and how she takes labetalol (NORMODYNE) 100 MG tablet  TID  taken w/ meals  losartan (COZAAR) 50 MG tablet  1 in the am (had not taken today)  amLODipine (NORVASC) 5 MG tablet  1 in the am (had no taken today)

## 2022-02-27 NOTE — Progress Notes (Signed)
Subjective:    Patient ID: Margaret Mcintosh, female    DOB: 01-12-1933, 88 y.o.   MRN: [id number]  HPI  Patient presents to clinic today for follow-up of chronic conditions.  HTN: Her BP today is 144/68.  She is taking Amlodipine, Labetalol and Losartan but she admits she is only taking labetalol 1 time a day instead of 3 times a day.  As prescribed.  ECG from 10/2020 reviewed.  HLD with History of Stroke: Her last LDL was 60, triglycerides 85, 08/2021.  She denies myalgias on Simvastatin.  She is taking Aspirin as well.  She tries to consume a low-fat diet.  GERD: Triggered by acidic foods.  She denies breakthrough on Pantoprazole.  There is no upper GI on file.  DM2: Her last A1c was 6.2%, 08/2021.  She is taking Metformin  as prescribed.  She does not check her sugars.  She checks her feet routinely.  Her last eye exam was 07/2021.  Flu 11/2021.  Pneumovax 03/2013.  Prevnar 02/2016.  COVID Moderna x2.  CKD: Her last creatinine was 1.12, GFR 48, 08/2021.  She is on Losartan for renal protection.  She does not follow with nephrology.  Review of Systems  Past Medical History:  Diagnosis Date   Arthritis    Cataract    Complication of anesthesia    "takes a long time to wake me up" (11/04/2015)   Diabetes mellitus (HCC)    GERD (gastroesophageal reflux disease)    Glaucoma    Hypertension    Stroke (HCC)    last 8/17. Mild left arm/leg weakness   TIA (transient ischemic attack) 11/04/2015    Current Outpatient Medications  Medication Sig Dispense Refill   amLODipine (NORVASC) 5 MG tablet Take 1 tablet by mouth once daily 90 tablet 0   aspirin 325 MG EC tablet Take 1 tablet by mouth daily.     Cholecalciferol (VITAMIN D3 PO) Take by mouth.     Elastic Bandages & Supports (MEDICAL COMPRESSION SOCKS) MISC 2 Devices by Does not apply route daily. 2 each 0   FREESTYLE LITE test strip 1 each by Other route daily. Use to test blood sugar up to 4 times daily. 90 each 4   labetalol  (NORMODYNE) 100 MG tablet TAKE 1 TABLET BY MOUTH THREE TIMES DAILY 270 tablet 0   losartan (COZAAR) 50 MG tablet Take 1 tablet (50 mg total) by mouth daily. 90 tablet 0   metFORMIN (GLUCOPHAGE) 500 MG tablet TAKE 1 TABLET BY MOUTH TWICE DAILY WITH A MEAL 180 tablet 1   Multiple Vitamin (MULTIVITAMIN) tablet Take 1 tablet by mouth daily.     Multiple Vitamins-Minerals (GNP ONE DAILY MENS/LYCOPENE) TABS      Omega-3 Fatty Acids (FISH OIL) 1200 MG CAPS Take by mouth 3 (three) times daily.     pantoprazole (PROTONIX) 40 MG tablet TAKE 1 TABLET BY MOUTH ONCE DAILY AT 6AM 90 tablet 2   simvastatin (ZOCOR) 20 MG tablet TAKE 1 TABLET BY MOUTH AT BEDTIME 90 tablet 2   No current facility-administered medications for this visit.    Allergies  Allergen Reactions   Codeine Other (See Comments)    Gets nausea and feels like"something is crawling" "somethings crawling in my head"    Diphenhydramine Hcl Other (See Comments)    Feels like skin crawling "somethings crawling in my head"    Other Other (See Comments)    CANNOT HAVE ANY "SPICY" FOODS!!    Family History  Problem  Relation Age of Onset   Hypertension Mother    Diabetes Mother    Heart disease Mother    Cancer Sister    Diabetes Brother    Hypertension Daughter    Other Son        Pituitary gland disease   Hypertension Son    Obesity Son    Colon cancer Brother    Diabetes Brother    Alzheimer's disease Brother    Diabetes Sister     Social History   Socioeconomic History   Marital status: Widowed    Spouse name: Not on file   Number of children: 2   Years of education: Not on file   Highest education level: Not on file  Occupational History   Occupation: Retired  Tobacco Use   Smoking status: Never   Smokeless tobacco: Never  Vaping Use   Vaping Use: Never used  Substance and Sexual Activity   Alcohol use: No   Drug use: No   Sexual activity: Not Currently  Other Topics Concern   Not on file  Social History  Narrative   Not on file   Social Determinants of Health   Financial Resource Strain: Low Risk  (07/29/2021)   Overall Financial Resource Strain (CARDIA)    Difficulty of Paying Living Expenses: Not hard at all  Food Insecurity: No Food Insecurity (07/29/2021)   Hunger Vital Sign    Worried About Running Out of Food in the Last Year: Never true    Ran Out of Food in the Last Year: Never true  Transportation Needs: No Transportation Needs (07/29/2021)   PRAPARE - Administrator, Civil Service (Medical): No    Lack of Transportation (Non-Medical): No  Physical Activity: Insufficiently Active (07/29/2021)   Exercise Vital Sign    Days of Exercise per Week: 2 days    Minutes of Exercise per Session: 20 min  Stress: No Stress Concern Present (07/29/2021)   [employer] of Occupational Health - Occupational Stress Questionnaire    Feeling of Stress : Not at all  Social Connections: Moderately Isolated (07/29/2021)   Social Connection and Isolation Panel [NHANES]    Frequency of Communication with Friends and Family: More than three times a week    Frequency of Social Gatherings with Friends and Family: Twice a week    Attends Religious Services: More than 4 times per year    Active Member of [employer] or Organizations: No    Attends Banker Meetings: Never    Marital Status: Widowed  Intimate Partner Violence: Not At Risk (07/29/2021)   Humiliation, Afraid, Rape, and Kick questionnaire    Fear of Current or Ex-Partner: No    Emotionally Abused: No    Physically Abused: No    Sexually Abused: No     Constitutional: Denies fever, malaise, fatigue, headache or abrupt weight changes.  HEENT: Denies eye pain, eye redness, ear pain, ringing in the ears, wax buildup, runny nose, nasal congestion, bloody nose, or sore throat. Respiratory: Denies difficulty breathing, shortness of breath, cough or sputum production.   Cardiovascular: Denies chest pain, chest tightness,  palpitations or swelling in the hands or feet.  Gastrointestinal: Denies abdominal pain, bloating, constipation, diarrhea or blood in the stool.  GU: Denies urgency, frequency, pain with urination, burning sensation, blood in urine, odor or discharge. Musculoskeletal: Patient ports intermittent back pain.  Denies decrease in range of motion, difficulty with gait, or joint swelling.  Skin: Denies redness, rashes, lesions or  ulcercations.  Neurological: Denies dizziness, difficulty with memory, difficulty with speech or problems with balance and coordination.  Psych: Denies anxiety, depression, SI/HI.  No other specific complaints in a complete review of systems (except as listed in HPI above).     Objective:   Physical Exam  BP (!) 144/68 (BP Location: Right Arm, Patient Position: Sitting, Cuff Size: Normal)   Pulse 67   Temp (!) 97.1 F (36.2 C) (Temporal)   Wt 173 lb (78.5 kg)   SpO2 99%   BMI 29.70 kg/m   Wt Readings from Last 3 Encounters:  08/25/21 171 lb (77.6 kg)  07/29/21 176 lb (79.8 kg)  07/08/21 176 lb 6.4 oz (80 kg)    General: Appears her stated age, overweight, in NAD. Skin: Warm, dry and intact.  HEENT: Head: normal shape and size; Eyes: sclera white, no icterus, conjunctiva pink, PERRLA and EOMs intact;   Cardiovascular: Normal rate and rhythm. S1,S2 noted.  No murmur, rubs or gallops noted. No JVD or BLE edema. No carotid bruits noted. Pulmonary/Chest: Normal effort and positive vesicular breath sounds. No respiratory distress. No wheezes, rales or ronchi noted.  Abdomen: Soft and nontender. Normal bowel sounds.  Musculoskeletal:  No difficulty with gait.  Neurological: Alert and oriented. Coordination normal.     BMET    Component Value Date/Time   NA 142 08/25/2021 0857   NA 135 (L) 04/09/2013 1025   K 4.4 08/25/2021 0857   K 3.8 04/09/2013 1025   CL 103 08/25/2021 0857   CL 103 04/09/2013 1025   CO2 28 08/25/2021 0857   CO2 28 04/09/2013 1025    GLUCOSE 120 (H) 08/25/2021 0857   GLUCOSE 211 (H) 04/09/2013 1025   BUN 23 08/25/2021 0857   BUN 14 04/09/2013 1025   CREATININE 1.12 (H) 08/25/2021 0857   CALCIUM 10.5 (H) 08/25/2021 0857   CALCIUM 9.2 04/09/2013 1025   GFRNONAA 58 (L) 11/22/2020 0827   GFRNONAA 58 (L) 11/18/2019 1010   GFRAA 67 11/18/2019 1010    Lipid Panel     Component Value Date/Time   CHOL 133 08/25/2021 0857   TRIG 85 08/25/2021 0857   HDL 56 08/25/2021 0857   CHOLHDL 2.4 08/25/2021 0857   VLDL 23 11/05/2015 0549   LDLCALC 60 08/25/2021 0857    CBC    Component Value Date/Time   WBC 4.1 08/25/2021 0857   RBC 3.80 08/25/2021 0857   HGB 11.1 (L) 08/25/2021 0857   HGB 14.9 04/09/2013 1025   HCT 35.2 08/25/2021 0857   HCT 44.4 04/09/2013 1025   PLT 177 08/25/2021 0857   PLT 184 04/09/2013 1025   MCV 92.6 08/25/2021 0857   MCV 89 04/09/2013 1025   MCH 29.2 08/25/2021 0857   MCHC 31.5 (L) 08/25/2021 0857   RDW 13.0 08/25/2021 0857   RDW 13.5 04/09/2013 1025   LYMPHSABS 1.0 11/22/2020 0827   MONOABS 0.4 11/22/2020 0827   EOSABS 0.1 11/22/2020 0827   BASOSABS 0.0 11/22/2020 0827    Hgb A1C Lab Results  Component Value Date   HGBA1C 6.2 (H) 08/25/2021           Assessment & Plan:     RTC in 6 months for your annual exam Nicki Reaper, NP

## 2022-02-27 NOTE — Assessment & Plan Note (Signed)
C-Met and lipid profile today Discussed the importance of blood pressure, blood sugar and cholesterol control Continue simvastatin, aspirin, amlodipine, labetalol and losartan

## 2022-02-27 NOTE — Assessment & Plan Note (Signed)
C-Met and lipid profile today Encouraged her to consume a low-fat diet Continue simvastatin 

## 2022-02-28 LAB — COMPLETE METABOLIC PANEL WITH GFR
AG Ratio: 1.6 (calc) (ref 1.0–2.5)
ALT: 12 U/L (ref 6–29)
AST: 12 U/L (ref 10–35)
Albumin: 4.6 g/dL (ref 3.6–5.1)
Alkaline phosphatase (APISO): 60 U/L (ref 37–153)
BUN/Creatinine Ratio: 22 (calc) (ref 6–22)
BUN: 24 mg/dL (ref 7–25)
CO2: 28 mmol/L (ref 20–32)
Calcium: 10.2 mg/dL (ref 8.6–10.4)
Chloride: 105 mmol/L (ref 98–110)
Creat: 1.1 mg/dL — ABNORMAL HIGH (ref 0.60–0.95)
Globulin: 2.8 g/dL (calc) (ref 1.9–3.7)
Glucose, Bld: 125 mg/dL — ABNORMAL HIGH (ref 65–99)
Potassium: 4.9 mmol/L (ref 3.5–5.3)
Sodium: 141 mmol/L (ref 135–146)
Total Bilirubin: 0.5 mg/dL (ref 0.2–1.2)
Total Protein: 7.4 g/dL (ref 6.1–8.1)
eGFR: 49 mL/min/{1.73_m2} — ABNORMAL LOW (ref >=60)

## 2022-02-28 LAB — LIPID PANEL
Cholesterol: 138 mg/dL (ref <200)
HDL: 62 mg/dL (ref >=50)
LDL Cholesterol (Calc): 60 mg/dL (calc)
Non-HDL Cholesterol (Calc): 76 mg/dL (calc) (ref <130)
Total CHOL/HDL Ratio: 2.2 (calc) (ref <5.0)
Triglycerides: 79 mg/dL (ref <150)

## 2022-02-28 LAB — CBC
HCT: 32.6 % — ABNORMAL LOW (ref 35.0–45.0)
Hemoglobin: 10.1 g/dL — ABNORMAL LOW (ref 11.7–15.5)
MCH: 25.8 pg — ABNORMAL LOW (ref 27.0–33.0)
MCHC: 31 g/dL — ABNORMAL LOW (ref 32.0–36.0)
MCV: 83.4 fL (ref 80.0–100.0)
MPV: 12.6 fL — ABNORMAL HIGH (ref 7.5–12.5)
Platelets: 203 10*3/uL (ref 140–400)
RBC: 3.91 10*6/uL (ref 3.80–5.10)
RDW: 13.2 % (ref 11.0–15.0)
WBC: 4.5 10*3/uL (ref 3.8–10.8)

## 2022-02-28 NOTE — Telephone Encounter (Signed)
noted 

## 2022-04-04 ENCOUNTER — Other Ambulatory Visit: Payer: Self-pay | Admitting: Internal Medicine

## 2022-04-04 DIAGNOSIS — E119 Type 2 diabetes mellitus without complications: Secondary | ICD-10-CM

## 2022-04-04 NOTE — Telephone Encounter (Signed)
Requested Prescriptions  Pending Prescriptions Disp Refills   metFORMIN (GLUCOPHAGE) 500 MG tablet [Pharmacy Med Name: metFORMIN HCl 500 MG Oral Tablet] 180 tablet 0    Sig: TAKE 1 TABLET BY MOUTH TWICE DAILY WITH A MEAL     Endocrinology:  Diabetes - Biguanides Failed - 04/04/2022 10:53 AM      Failed - Cr in normal range and within 360 days    Creat  Date Value Ref Range Status  02/27/2022 1.10 (H) 0.60 - 0.95 mg/dL Final   Creatinine, Urine  Date Value Ref Range Status  08/25/2021 102 20 - 275 mg/dL Final         Failed - eGFR in normal range and within 360 days    GFR, Est African American  Date Value Ref Range Status  11/18/2019 67 > OR = 60 mL/min/1.104m2 Final   GFR, Est Non African American  Date Value Ref Range Status  11/18/2019 58 (L) > OR = 60 mL/min/1.51m2 Final   GFR, Estimated  Date Value Ref Range Status  11/22/2020 58 (L) >60 mL/min Final    Comment:    (NOTE) Calculated using the CKD-EPI Creatinine Equation (2021)    eGFR  Date Value Ref Range Status  02/27/2022 49 (L) > OR = 60 mL/min/1.66m2 Final         Failed - B12 Level in normal range and within 720 days    No results found for: "VITAMINB12"       Failed - CBC within normal limits and completed in the last 12 months    WBC  Date Value Ref Range Status  02/27/2022 4.5 3.8 - 10.8 Thousand/uL Final   RBC  Date Value Ref Range Status  02/27/2022 3.91 3.80 - 5.10 Million/uL Final   Hemoglobin  Date Value Ref Range Status  02/27/2022 10.1 (L) 11.7 - 15.5 g/dL Final   HGB  Date Value Ref Range Status  04/09/2013 14.9 12.0 - 16.0 g/dL Final   HCT  Date Value Ref Range Status  02/27/2022 32.6 (L) 35.0 - 45.0 % Final  04/09/2013 44.4 35.0 - 47.0 % Final   MCHC  Date Value Ref Range Status  02/27/2022 31.0 (L) 32.0 - 36.0 g/dL Final   [facility]  Date Value Ref Range Status  02/27/2022 25.8 (L) 27.0 - 33.0 pg Final   MCV  Date Value Ref Range Status  02/27/2022 83.4 80.0 - 100.0 fL Final   04/09/2013 89 80 - 100 fL Final   No results found for: "PLTCOUNTKUC", "LABPLAT", "POCPLA" RDW  Date Value Ref Range Status  02/27/2022 13.2 11.0 - 15.0 % Final  04/09/2013 13.5 11.5 - 14.5 % Final         Passed - HBA1C is between 0 and 7.9 and within 180 days    Hemoglobin A1C  Date Value Ref Range Status  02/27/2022 6.5 (A) 4.0 - 5.6 % Final   Hgb A1c MFr Bld  Date Value Ref Range Status  08/25/2021 6.2 (H) <5.7 % of total Hgb Final    Comment:    For someone without known diabetes, a hemoglobin  A1c value between 5.7% and 6.4% is consistent with prediabetes and should be confirmed with a  follow-up test. . For someone with known diabetes, a value <7% indicates that their diabetes is well controlled. A1c targets should be individualized based on duration of diabetes, age, comorbid conditions, and other considerations. . This assay result is consistent with an increased risk of diabetes. . Currently, no  consensus exists regarding use of hemoglobin A1c for diagnosis of diabetes for children. Renella Cunas - Valid encounter within last 6 months    Recent Outpatient Visits           1 month ago Type 2 diabetes mellitus with hyperglycemia, without long-term current use of insulin [facility])   [facility] [place], Coralie Keens, NP   7 months ago Encounter for general adult medical examination with abnormal findings   [facility] [place], Coralie Keens, NP   9 months ago Left sided sciatica   [facility] Olin Hauser, DO   1 year ago Type 2 diabetes mellitus with hyperglycemia, without long-term current use of insulin [facility])   [facility], Coralie Keens, NP   1 year ago Essential hypertension   [place], NP       Future Appointments             In 4 months Baity, Coralie Keens, NP [facility], [facility]

## 2022-05-21 ENCOUNTER — Other Ambulatory Visit: Payer: Self-pay | Admitting: Internal Medicine

## 2022-05-21 DIAGNOSIS — I10 Essential (primary) hypertension: Secondary | ICD-10-CM

## 2022-05-21 DIAGNOSIS — Z0001 Encounter for general adult medical examination with abnormal findings: Secondary | ICD-10-CM

## 2022-05-22 NOTE — Telephone Encounter (Signed)
Requested Prescriptions  Pending Prescriptions Disp Refills   amLODipine (NORVASC) 5 MG tablet [Pharmacy Med Name: amLODIPine Besylate 5 MG Oral Tablet] 90 tablet 1    Sig: Take 1 tablet by mouth once daily     Cardiovascular: Calcium Channel Blockers 2 Failed - 05/21/2022 12:21 PM      Failed - Last BP in normal range    BP Readings from Last 1 Encounters:  02/27/22 (!) 144/68         Passed - Last Heart Rate in normal range    Pulse Readings from Last 1 Encounters:  02/27/22 67         Passed - Valid encounter within last 6 months    Recent Outpatient Visits           2 months ago Type 2 diabetes mellitus with hyperglycemia, without long-term current use of insulin ([place])   [facility] [place], Coralie Keens, NP   9 months ago Encounter for general adult medical examination with abnormal findings   [facility] [place], Coralie Keens, NP   10 months ago Left sided sciatica   [facility] [place], Devonne Doughty, DO   1 year ago Type 2 diabetes mellitus with hyperglycemia, without long-term current use of insulin [facility])   [facility] [place], Coralie Keens, NP   1 year ago Essential hypertension   [place], Coralie Keens, NP       Future Appointments             In 3 months Baity, Coralie Keens, NP [facility]             labetalol ([place]) 100 MG tablet [Pharmacy Med Name: Labetalol HCl 100 MG Oral Tablet] 270 tablet 1    Sig: TAKE 1 TABLET BY MOUTH THREE TIMES DAILY     Cardiovascular:  Beta Blockers Failed - 05/21/2022 12:21 PM      Failed - Last BP in normal range    BP Readings from Last 1 Encounters:  02/27/22 (!) 144/68         Passed - Last Heart Rate in normal range    Pulse Readings from Last 1 Encounters:  02/27/22 67         Passed - Valid encounter within last 6 months    Recent Outpatient Visits            2 months ago Type 2 diabetes mellitus with hyperglycemia, without long-term current use of insulin [facility])   [facility] [place], Coralie Keens, NP   9 months ago Encounter for general adult medical examination with abnormal findings   [facility] [place], Coralie Keens, NP   10 months ago Left sided sciatica   [facility] [place], Devonne Doughty, DO   1 year ago Type 2 diabetes mellitus with hyperglycemia, without long-term current use of insulin [facility])   [facility] [place], Coralie Keens, NP   1 year ago Essential hypertension   [facility] [place], Coralie Keens, NP       Future Appointments             In 3 months Baity, Coralie Keens, NP [facility], [facility]  losartan (COZAAR) 50 MG tablet [Pharmacy Med Name: Losartan Potassium 50 MG Oral Tablet] 270 tablet 1    Sig: TAKE 1 TABLET BY MOUTH THREE TIMES DAILY WITH MEALS IN THE MORNING, AT NOON AND AT BEDTIME     Cardiovascular:  Angiotensin Receptor Blockers Failed - 05/21/2022 12:21 PM      Failed - Cr in normal range and within 180 days    Creat  Date Value Ref Range Status  02/27/2022 1.10 (H) 0.60 - 0.95 mg/dL Final   Creatinine, Urine  Date Value Ref Range Status  08/25/2021 102 20 - 275 mg/dL Final         Failed - Last BP in normal range    BP Readings from Last 1 Encounters:  02/27/22 (!) 144/68         Passed - K in normal range and within 180 days    Potassium  Date Value Ref Range Status  02/27/2022 4.9 3.5 - 5.3 mmol/L Final  04/09/2013 3.8 3.5 - 5.1 mmol/L Final         Passed - Patient is not pregnant      Passed - Valid encounter within last 6 months    Recent Outpatient Visits           2 months ago Type 2 diabetes mellitus with hyperglycemia, without long-term current use of insulin [facility])   [facility] [place],  Coralie Keens, NP   9 months ago Encounter for general adult medical examination with abnormal findings   [facility] [place], Coralie Keens, NP   10 months ago Left sided sciatica   [facility] O[facility] Hauser, DO   1 year ago Type 2 diabetes mellitus with hyperglycemia, without long-term current use of insulin [facility])   [facility] [place], Coralie Keens, NP   1 year ago Essential hypertension   [facility] [place], Coralie Keens, NP       Future Appointments             In 3 months Baity, Coralie Keens, NP [facility], [facility]

## 2022-06-01 LAB — HM DIABETES EYE EXAM

## 2022-07-02 ENCOUNTER — Other Ambulatory Visit: Payer: Self-pay | Admitting: Internal Medicine

## 2022-07-02 DIAGNOSIS — E119 Type 2 diabetes mellitus without complications: Secondary | ICD-10-CM

## 2022-07-04 NOTE — Telephone Encounter (Signed)
Requested Prescriptions  Pending Prescriptions Disp Refills   metFORMIN (GLUCOPHAGE) 500 MG tablet [Pharmacy Med Name: metFORMIN HCl 500 MG Oral Tablet] 180 tablet 0    Sig: TAKE 1 TABLET BY MOUTH TWICE DAILY WITH A MEAL     Endocrinology:  Diabetes - Biguanides Failed - 07/02/2022 10:33 AM      Failed - Cr in normal range and within 360 days    Creat  Date Value Ref Range Status  02/27/2022 1.10 (H) 0.60 - 0.95 mg/dL Final   Creatinine, Urine  Date Value Ref Range Status  08/25/2021 102 20 - 275 mg/dL Final         Failed - eGFR in normal range and within 360 days    GFR, Est African American  Date Value Ref Range Status  11/18/2019 67 > OR = 60 mL/min/1.32m2 Final   GFR, Est Non African American  Date Value Ref Range Status  11/18/2019 58 (L) > OR = 60 mL/min/1.73m2 Final   GFR, Estimated  Date Value Ref Range Status  11/22/2020 58 (L) >60 mL/min Final    Comment:    (NOTE) Calculated using the CKD-EPI Creatinine Equation (2021)    eGFR  Date Value Ref Range Status  02/27/2022 49 (L) > OR = 60 mL/min/1.20m2 Final         Failed - B12 Level in normal range and within 720 days    No results found for: "VITAMINB12"       Failed - CBC within normal limits and completed in the last 12 months    WBC  Date Value Ref Range Status  02/27/2022 4.5 3.8 - 10.8 Thousand/uL Final   RBC  Date Value Ref Range Status  02/27/2022 3.91 3.80 - 5.10 Million/uL Final   Hemoglobin  Date Value Ref Range Status  02/27/2022 10.1 (L) 11.7 - 15.5 g/dL Final   HGB  Date Value Ref Range Status  04/09/2013 14.9 12.0 - 16.0 g/dL Final   HCT  Date Value Ref Range Status  02/27/2022 32.6 (L) 35.0 - 45.0 % Final  04/09/2013 44.4 35.0 - 47.0 % Final   MCHC  Date Value Ref Range Status  02/27/2022 31.0 (L) 32.0 - 36.0 g/dL Final   [facility]  Date Value Ref Range Status  02/27/2022 25.8 (L) 27.0 - 33.0 pg Final   MCV  Date Value Ref Range Status  02/27/2022 83.4 80.0 - 100.0 fL Final   04/09/2013 89 80 - 100 fL Final   No results found for: "PLTCOUNTKUC", "LABPLAT", "POCPLA" RDW  Date Value Ref Range Status  02/27/2022 13.2 11.0 - 15.0 % Final  04/09/2013 13.5 11.5 - 14.5 % Final         Passed - HBA1C is between 0 and 7.9 and within 180 days    Hemoglobin A1C  Date Value Ref Range Status  02/27/2022 6.5 (A) 4.0 - 5.6 % Final   Hgb A1c MFr Bld  Date Value Ref Range Status  08/25/2021 6.2 (H) <5.7 % of total Hgb Final    Comment:    For someone without known diabetes, a hemoglobin  A1c value between 5.7% and 6.4% is consistent with prediabetes and should be confirmed with a  follow-up test. . For someone with known diabetes, a value <7% indicates that their diabetes is well controlled. A1c targets should be individualized based on duration of diabetes, age, comorbid conditions, and other considerations. . This assay result is consistent with an increased risk of diabetes. . Currently, no  consensus exists regarding use of hemoglobin A1c for diagnosis of diabetes for children. Verna Czech - Valid encounter within last 6 months    Recent Outpatient Visits           4 months ago Type 2 diabetes mellitus with hyperglycemia, without long-term current use of insulin [facility])   [place] [facility] [employer], Salvadore Oxford, NP   10 months ago Encounter for general adult medical examination with abnormal findings   [place] [facility] [place], Salvadore Oxford, NP   12 months ago Left sided sciatica   [place] [facility] Smitty Cords, DO   1 year ago Type 2 diabetes mellitus with hyperglycemia, without long-term current use of insulin [facility])   [place] [facility] [place], Salvadore Oxford, NP   1 year ago Essential hypertension   [place] [facility] [place], Salvadore Oxford, NP       Future Appointments             In 1 month Baity, Salvadore Oxford, NP [place] [facility], [facility]

## 2022-07-17 ENCOUNTER — Telehealth: Payer: Self-pay | Admitting: Internal Medicine

## 2022-07-17 NOTE — Telephone Encounter (Signed)
Contacted Margaret Mcintosh to schedule their annual wellness visit. Appointment made for 08/10/2022.  Margaret Mcintosh; Care Guide Ambulatory Clinical Support [place] [facility] Direct Dial: [phone]

## 2022-08-10 ENCOUNTER — Ambulatory Visit (INDEPENDENT_AMBULATORY_CARE_PROVIDER_SITE_OTHER): Payer: PPO

## 2022-08-10 VITALS — Wt 173.0 lb

## 2022-08-10 DIAGNOSIS — Z0000 Encounter for general adult medical examination without abnormal findings: Secondary | ICD-10-CM | POA: Diagnosis not present

## 2022-08-10 NOTE — Patient Instructions (Signed)
Margaret Mcintosh , Thank you for taking time to come for your Medicare Wellness Visit. I appreciate your ongoing commitment to your health goals. Please review the following plan we discussed and let me know if I can assist you in the future.   These are the goals we discussed:  Goals      DIET - EAT MORE FRUITS AND VEGETABLES     DIET - INCREASE WATER INTAKE     Patient Stated     09/30/2019, stay healthy        This is a list of the screening recommended for you and due dates:  Health Maintenance  Topic Date Due   DTaP/Tdap/Td vaccine (1 - Tdap) Never done   Zoster (Shingles) Vaccine (1 of 2) Never done   DEXA scan (bone density measurement)  Never done   COVID-19 Vaccine (3 - 2023-24 season) 11/25/2021   Eye exam for diabetics  08/25/2022   Hemoglobin A1C  08/29/2022   Flu Shot  10/26/2022   Complete foot exam   02/28/2023   Medicare Annual Wellness Visit  08/10/2023   Pneumonia Vaccine  Completed   HPV Vaccine  Aged Out    Advanced directives: no  Conditions/risks identified: none  Next appointment: Follow up in one year for your annual wellness visit 08/16/23 @ 1:00 pm by phone   Preventive Care 65 Years and Older, Female Preventive care refers to lifestyle choices and visits with your health care provider that can promote health and wellness. What does preventive care include? A yearly physical exam. This is also called an annual well check. Dental exams once or twice a year. Routine eye exams. Ask your health care provider how often you should have your eyes checked. Personal lifestyle choices, including: Daily care of your teeth and gums. Regular physical activity. Eating a healthy diet. Avoiding tobacco and drug use. Limiting alcohol use. Practicing safe sex. Taking low-dose aspirin every day. Taking vitamin and mineral supplements as recommended by your health care provider. What happens during an annual well check? The services and screenings done by your  health care provider during your annual well check will depend on your age, overall health, lifestyle risk factors, and family history of disease. Counseling  Your health care provider may ask you questions about your: Alcohol use. Tobacco use. Drug use. Emotional well-being. Home and relationship well-being. Sexual activity. Eating habits. History of falls. Memory and ability to understand (cognition). Work and work Astronomer. Reproductive health. Screening  You may have the following tests or measurements: Height, weight, and BMI. Blood pressure. Lipid and cholesterol levels. These may be checked every 5 years, or more frequently if you are over 1 years old. Skin check. Lung cancer screening. You may have this screening every year starting at age 91 if you have a 30-pack-year history of smoking and currently smoke or have quit within the past 15 years. Fecal occult blood test (FOBT) of the stool. You may have this test every year starting at age 70. Flexible sigmoidoscopy or colonoscopy. You may have a sigmoidoscopy every 5 years or a colonoscopy every 10 years starting at age 50. Hepatitis C blood test. Hepatitis B blood test. Sexually transmitted disease (STD) testing. Diabetes screening. This is done by checking your blood sugar (glucose) after you have not eaten for a while (fasting). You may have this done every 1-3 years. Bone density scan. This is done to screen for osteoporosis. You may have this done starting at age 68. Mammogram. This  may be done every 1-2 years. Talk to your health care provider about how often you should have regular mammograms. Talk with your health care provider about your test results, treatment options, and if necessary, the need for more tests. Vaccines  Your health care provider may recommend certain vaccines, such as: Influenza vaccine. This is recommended every year. Tetanus, diphtheria, and acellular pertussis (Tdap, Td) vaccine. You may  need a Td booster every 10 years. Zoster vaccine. You may need this after age 44. Pneumococcal 13-valent conjugate (PCV13) vaccine. One dose is recommended after age 57. Pneumococcal polysaccharide (PPSV23) vaccine. One dose is recommended after age 39. Talk to your health care provider about which screenings and vaccines you need and how often you need them. This information is not intended to replace advice given to you by your health care provider. Make sure you discuss any questions you have with your health care provider. Document Released: 04/09/2015 Document Revised: 12/01/2015 Document Reviewed: 01/12/2015 Elsevier Interactive Patient Education  2017 [place] Prevention in the Home Falls can cause injuries. They can happen to people of all ages. There are many things you can do to make your home safe and to help prevent falls. What can I do on the outside of my home? Regularly fix the edges of walkways and driveways and fix any cracks. Remove anything that might make you trip as you walk through a door, such as a raised step or threshold. Trim any bushes or trees on the path to your home. Use bright outdoor lighting. Clear any walking paths of anything that might make someone trip, such as rocks or tools. Regularly check to see if handrails are loose or broken. Make sure that both sides of any steps have handrails. Any raised decks and porches should have guardrails on the edges. Have any leaves, snow, or ice cleared regularly. Use sand or salt on walking paths during winter. Clean up any spills in your garage right away. This includes oil or grease spills. What can I do in the bathroom? Use night lights. Install grab bars by the toilet and in the tub and shower. Do not use towel bars as grab bars. Use non-skid mats or decals in the tub or shower. If you need to sit down in the shower, use a plastic, non-slip stool. Keep the floor dry. Clean up any water that spills on  the floor as soon as it happens. Remove soap buildup in the tub or shower regularly. Attach bath mats securely with double-sided non-slip rug tape. Do not have throw rugs and other things on the floor that can make you trip. What can I do in the bedroom? Use night lights. Make sure that you have a light by your bed that is easy to reach. Do not use any sheets or blankets that are too big for your bed. They should not hang down onto the floor. Have a firm chair that has side arms. You can use this for support while you get dressed. Do not have throw rugs and other things on the floor that can make you trip. What can I do in the kitchen? Clean up any spills right away. Avoid walking on wet floors. Keep items that you use a lot in easy-to-reach places. If you need to reach something above you, use a strong step stool that has a grab bar. Keep electrical cords out of the way. Do not use floor polish or wax that makes floors slippery. If you must  use wax, use non-skid floor wax. Do not have throw rugs and other things on the floor that can make you trip. What can I do with my stairs? Do not leave any items on the stairs. Make sure that there are handrails on both sides of the stairs and use them. Fix handrails that are broken or loose. Make sure that handrails are as long as the stairways. Check any carpeting to make sure that it is firmly attached to the stairs. Fix any carpet that is loose or worn. Avoid having throw rugs at the top or bottom of the stairs. If you do have throw rugs, attach them to the floor with carpet tape. Make sure that you have a light switch at the top of the stairs and the bottom of the stairs. If you do not have them, ask someone to add them for you. What else can I do to help prevent falls? Wear shoes that: Do not have high heels. Have rubber bottoms. Are comfortable and fit you well. Are closed at the toe. Do not wear sandals. If you use a stepladder: Make sure  that it is fully opened. Do not climb a closed stepladder. Make sure that both sides of the stepladder are locked into place. Ask someone to hold it for you, if possible. Clearly mark and make sure that you can see: Any grab bars or handrails. First and last steps. Where the edge of each step is. Use tools that help you move around (mobility aids) if they are needed. These include: Canes. Walkers. Scooters. Crutches. Turn on the lights when you go into a dark area. Replace any light bulbs as soon as they burn out. Set up your furniture so you have a clear path. Avoid moving your furniture around. If any of your floors are uneven, fix them. If there are any pets around you, be aware of where they are. Review your medicines with your doctor. Some medicines can make you feel dizzy. This can increase your chance of falling. Ask your doctor what other things that you can do to help prevent falls. This information is not intended to replace advice given to you by your health care provider. Make sure you discuss any questions you have with your health care provider. Document Released: 01/07/2009 Document Revised: 08/19/2015 Document Reviewed: 04/17/2014 Elsevier Interactive Patient Education  2017 [employer].

## 2022-08-10 NOTE — Progress Notes (Signed)
I connected with  Margaret Mcintosh on 08/10/22 by a audio enabled telemedicine application and verified that I am speaking with the correct person using two identifiers.  Patient Location: Home  Provider Location: Office/Clinic  I discussed the limitations of evaluation and management by telemedicine. The patient expressed understanding and agreed to proceed.  Subjective:   Margaret Mcintosh is a 89 y.o. female who presents for Medicare Annual (Subsequent) preventive examination.  Review of Systems     Cardiac Risk Factors include: advanced age (>32men, >68 women);diabetes mellitus;dyslipidemia;hypertension     Objective:    There were no vitals filed for this visit. There is no height or weight on file to calculate BMI.     08/10/2022    1:08 PM 07/29/2021    8:50 AM 09/30/2019    9:04 AM 06/12/2017    7:16 AM 03/07/2016    6:43 AM 02/08/2016    6:51 AM 11/04/2015   10:45 PM  Advanced Directives  Does Patient Have a Medical Advance Directive? No No No Yes Yes Yes Yes  Type of [employer] [place];Living will Healthcare Power of [place];Living will   Copy of Healthcare Power of Attorney in Chart?    Yes No - copy requested No - copy requested   Would patient like information on creating a medical advance directive? No - Patient declined No - Patient declined No - Patient declined    Yes - Educational materials given    Current Medications (verified) Outpatient Encounter Medications as of 08/10/2022  Medication Sig   amLODipine (NORVASC) 5 MG tablet Take 1 tablet by mouth once daily   aspirin 325 MG EC tablet Take 1 tablet by mouth daily.   Cholecalciferol (VITAMIN D3 PO) Take by mouth.   Elastic Bandages & Supports (MEDICAL COMPRESSION SOCKS) MISC 2 Devices by Does not apply route daily.   ferrous sulfate 324 MG TBEC Take 324 mg by mouth.   FREESTYLE LITE test strip 1 each by Other route daily. Use to test blood sugar  up to 4 times daily.   labetalol (NORMODYNE) 100 MG tablet TAKE 1 TABLET BY MOUTH THREE TIMES DAILY   losartan (COZAAR) 50 MG tablet TAKE 1 TABLET BY MOUTH THREE TIMES DAILY WITH MEALS IN THE MORNING, AT NOON AND AT BEDTIME   metFORMIN (GLUCOPHAGE) 500 MG tablet TAKE 1 TABLET BY MOUTH TWICE DAILY WITH A MEAL   Multiple Vitamin (MULTIVITAMIN) tablet Take 1 tablet by mouth daily.   Multiple Vitamins-Minerals (GNP ONE DAILY MENS/LYCOPENE) TABS    Omega-3 Fatty Acids (FISH OIL) 1200 MG CAPS Take by mouth 3 (three) times daily.   pantoprazole (PROTONIX) 40 MG tablet TAKE 1 TABLET BY MOUTH ONCE DAILY AT 6AM   simvastatin (ZOCOR) 20 MG tablet TAKE 1 TABLET BY MOUTH AT BEDTIME   [DISCONTINUED] gabapentin (NEURONTIN) 100 MG capsule Take 1 capsule (100 mg total) by mouth at bedtime. INCREASE to 1 capsule in am and at bedtime after 2 weeks if needed.   No facility-administered encounter medications on file as of 08/10/2022.    Allergies (verified) Codeine, Diphenhydramine hcl, and Other   History: Past Medical History:  Diagnosis Date   Arthritis    Cataract    Complication of anesthesia    "takes a long time to wake me up" (11/04/2015)   Diabetes mellitus (HCC)    GERD (gastroesophageal reflux disease)    Glaucoma    Hypertension    Stroke (  HCC)    last 8/17. Mild left arm/leg weakness   TIA (transient ischemic attack) 11/04/2015   Past Surgical History:  Procedure Laterality Date   ANKLE FRACTURE SURGERY Left 2000   "screw is still there" (11/04/2015)   APPENDECTOMY  ~1960   BROW LIFT Bilateral 06/12/2017   Procedure: BLEPHAROPLASTY UPPER EYELID WITH EXCESS SKIN DIABETIC;  Surgeon: Imagene Riches, MD;  Location: [facility];  Service: Ophthalmology;  Laterality: Bilateral;  Diabetic - oral meds   CATARACT EXTRACTION W/PHACO Right 02/08/2016   Procedure: CATARACT EXTRACTION PHACO AND INTRAOCULAR LENS PLACEMENT (IOC);  Surgeon: Nevada Crane, MD;  Location: [facility];   Service: Ophthalmology;  Laterality: Right;  DIABETIC - oral meds RIGHT   CATARACT EXTRACTION W/PHACO Left 03/07/2016   Procedure: CATARACT EXTRACTION PHACO AND INTRAOCULAR LENS PLACEMENT (IOC);  Surgeon: Nevada Crane, MD;  Location: [facility];  Service: Ophthalmology;  Laterality: Left;  DIABETIC - oral meds LEFT   FRACTURE SURGERY     VAGINAL HYSTERECTOMY  1973   Family History  Problem Relation Age of Onset   Hypertension Mother    Diabetes Mother    Heart disease Mother    Cancer Sister    Diabetes Brother    Hypertension Daughter    Other Son        Pituitary gland disease   Hypertension Son    Obesity Son    Colon cancer Brother    Diabetes Brother    Alzheimer's disease Brother    Diabetes Sister    Social History   Socioeconomic History   Marital status: Widowed    Spouse name: Not on file   Number of children: 2   Years of education: Not on file   Highest education level: Not on file  Occupational History   Occupation: Retired  Tobacco Use   Smoking status: Never   Smokeless tobacco: Never  Vaping Use   Vaping Use: Never used  Substance and Sexual Activity   Alcohol use: No   Drug use: No   Sexual activity: Not Currently  Other Topics Concern   Not on file  Social History Narrative   Not on file   Social Determinants of Health   Financial Resource Strain: Low Risk  (08/10/2022)   Overall Financial Resource Strain (CARDIA)    Difficulty of Paying Living Expenses: Not hard at all  Food Insecurity: No Food Insecurity (08/10/2022)   Hunger Vital Sign    Worried About Running Out of Food in the Last Year: Never true    Ran Out of Food in the Last Year: Never true  Transportation Needs: No Transportation Needs (08/10/2022)   PRAPARE - Administrator, Civil Service (Medical): No    Lack of Transportation (Non-Medical): No  Physical Activity: Insufficiently Active (08/10/2022)   Exercise Vital Sign    Days of Exercise per Week: 2  days    Minutes of Exercise per Session: 20 min  Stress: No Stress Concern Present (08/10/2022)   [employer] of Occupational Health - Occupational Stress Questionnaire    Feeling of Stress : Not at all  Social Connections: Moderately Isolated (08/10/2022)   Social Connection and Isolation Panel [NHANES]    Frequency of Communication with Friends and Family: More than three times a week    Frequency of Social Gatherings with Friends and Family: Once a week    Attends Religious Services: More than 4 times per year    Active Member of  Clubs or Organizations: No    Attends Banker Meetings: Never    Marital Status: Widowed    Tobacco Counseling Counseling given: Not Answered   Clinical Intake:  Pre-visit preparation completed: Yes  Pain : No/denies pain     Nutritional Risks: None Diabetes: Yes CBG done?: No Did pt. bring in CBG monitor from home?: No  How often do you need to have someone help you when you read instructions, pamphlets, or other written materials from your doctor or pharmacy?: 1 - Never  Diabetic?yes Nutrition Risk Assessment:  Has the patient had any N/V/D within the last 2 months?  No  Does the patient have any non-healing wounds?  No  Has the patient had any unintentional weight loss or weight gain?  No   Diabetes:  Is the patient diabetic?  Yes  If diabetic, was a CBG obtained today?  No  Did the patient bring in their glucometer from home?  No  How often do you monitor your CBG's? never.   Financial Strains and Diabetes Management:  Are you having any financial strains with the device, your supplies or your medication? No .  Does the patient want to be seen by Chronic Care Management for management of their diabetes?  No  Would the patient like to be referred to a Nutritionist or for Diabetic Management?  No   Diabetic Exams:  Diabetic Eye Exam: Completed 08/24/21.  Pt has been advised about the importance in completing this  exam. Diabetic Foot Exam: Completed 02/27/22. Pt has been advised about the importance in completing this exam.    Interpreter Needed?: No  Information entered by :: Kennedy Bucker, LPN   Activities of Daily Living    08/10/2022    1:09 PM  In your present state of health, do you have any difficulty performing the following activities:  Hearing? 0  Vision? 0  Difficulty concentrating or making decisions? 0  Walking or climbing stairs? 0  Dressing or bathing? 0  Doing errands, shopping? 0  Preparing Food and eating ? N  Using the Toilet? N  In the past six months, have you accidently leaked urine? N  Do you have problems with loss of bowel control? N  Managing your Medications? N  Managing your Finances? N  Housekeeping or managing your Housekeeping? N    Patient Care Team: Lorre Munroe, NP as PCP - General (Internal Medicine)  Indicate any recent Medical Services you may have received from other than [facility] providers in the past year (date may be approximate).     Assessment:   This is a routine wellness examination for [facility].  Hearing/Vision screen Hearing Screening - Comments:: No aids Vision Screening - Comments:: Wears glasses- Dr.King  Dietary issues and exercise activities discussed: Current Exercise Habits: Home exercise routine, Type of exercise: walking, Time (Minutes): 20, Frequency (Times/Week): 2, Weekly Exercise (Minutes/Week): 40, Intensity: Mild   Goals Addressed             This Visit's Progress    DIET - INCREASE WATER INTAKE         Depression Screen    08/10/2022    1:06 PM 07/29/2021    8:49 AM 11/25/2020   12:34 PM 09/30/2019    9:05 AM 05/21/2019    9:59 AM 09/28/2017    8:29 AM 05/31/2017    9:01 AM  PHQ 2/9 Scores  PHQ - 2 Score 0 0 0 0 0 0 0  PHQ- 9 Score  0     0 0    Fall Risk    08/10/2022    1:08 PM 07/29/2021    8:51 AM 09/30/2019    9:05 AM 02/19/2019   10:30 AM 09/28/2017    8:29 AM  Fall Risk   Falls in the past year? 0 1 0 0  No  Comment    Emmi Telephone Survey: data to providers prior to load   Number falls in past yr: 0 0     Injury with Fall? 0 0     Risk for fall due to : No Fall Risks Impaired balance/gait Medication side effect    Follow up Falls prevention discussed;Falls evaluation completed Falls prevention discussed Falls evaluation completed;Education provided;Falls prevention discussed      FALL RISK PREVENTION PERTAINING TO THE HOME:  Any stairs in or around the home? Yes  If so, are there any without handrails? No  Home free of loose throw rugs in walkways, pet beds, electrical cords, etc? Yes  Adequate lighting in your home to reduce risk of falls? Yes   ASSISTIVE DEVICES UTILIZED TO PREVENT FALLS:  Life alert? No  Use of a cane, walker or w/c? Yes - cane occasionally Grab bars in the bathroom? Yes  Shower chair or bench in shower? Yes  Elevated toilet seat or a handicapped toilet? Yes    Cognitive Function:        08/10/2022    1:12 PM 09/30/2019    9:07 AM  6CIT Screen  What Year? 0 points 0 points  What month? 0 points 0 points  What time? 0 points 0 points  Count back from 20 0 points 0 points  Months in reverse 0 points 0 points  Repeat phrase 0 points 4 points  Total Score 0 points 4 points    Immunizations Immunization History  Administered Date(s) Administered   Fluad Quad(high Dose 65+) 12/04/2019, 12/09/2020, 02/27/2022   Influenza, High Dose Seasonal PF 12/31/2017   Influenza,inj,Quad PF,6+ Mos 01/31/2016, 02/12/2017   Influenza-Unspecified 01/03/2019   Moderna Sars-Covid-2 Vaccination 05/11/2019, 06/09/2019   Pneumococcal Conjugate-13 02/28/2016   Pneumococcal Polysaccharide-23 04/21/2013    TDAP status: Due, Education has been provided regarding the importance of this vaccine. Advised may receive this vaccine at local pharmacy or Health Dept. Aware to provide a copy of the vaccination record if obtained from local pharmacy or Health Dept. Verbalized acceptance  and understanding.  Flu Vaccine status: Up to date  Pneumococcal vaccine status: Up to date  Covid-19 vaccine status: Completed vaccines  Qualifies for Shingles Vaccine? Yes   Zostavax completed No   Shingrix Completed?: No.    Education has been provided regarding the importance of this vaccine. Patient has been advised to call insurance company to determine out of pocket expense if they have not yet received this vaccine. Advised may also receive vaccine at local pharmacy or Health Dept. Verbalized acceptance and understanding.  Screening Tests Health Maintenance  Topic Date Due   DTaP/Tdap/Td (1 - Tdap) Never done   Zoster Vaccines- Shingrix (1 of 2) Never done   DEXA SCAN  Never done   COVID-19 Vaccine (3 - 2023-24 season) 11/25/2021   OPHTHALMOLOGY EXAM  08/25/2022   HEMOGLOBIN A1C  08/29/2022   INFLUENZA VACCINE  10/26/2022   FOOT EXAM  02/28/2023   Medicare Annual Wellness (AWV)  08/10/2023   Pneumonia Vaccine 37+ Years old  Completed   HPV VACCINES  Aged Out    Health Maintenance  Health  Maintenance Due  Topic Date Due   DTaP/Tdap/Td (1 - Tdap) Never done   Zoster Vaccines- Shingrix (1 of 2) Never done   DEXA SCAN  Never done   COVID-19 Vaccine (3 - 2023-24 season) 11/25/2021    Colorectal cancer screening: No longer required.   Mammogram status: No longer required due to age.   Lung Cancer Screening: (Low Dose CT Chest recommended if Age 13-80 years, 30 pack-year currently smoking OR have quit w/in 15years.) does not qualify.    Additional Screening:  Hepatitis C Screening: does not qualify; Completed no  Vision Screening: Recommended annual ophthalmology exams for early detection of glaucoma and other disorders of the eye. Is the patient up to date with their annual eye exam?  Yes  Who is the provider or what is the name of the office in which the patient attends annual eye exams? Dr.King If pt is not established with a provider, would they like to be  referred to a provider to establish care? No .   Dental Screening: Recommended annual dental exams for proper oral hygiene  Community Resource Referral / Chronic Care Management: CRR required this visit?  No   CCM required this visit?  No      Plan:     I have personally reviewed and noted the following in the patient's chart:   Medical and social history Use of alcohol, tobacco or illicit drugs  Current medications and supplements including opioid prescriptions. Patient is not currently taking opioid prescriptions. Functional ability and status Nutritional status Physical activity Advanced directives List of other physicians Hospitalizations, surgeries, and ER visits in previous 12 months Vitals Screenings to include cognitive, depression, and falls Referrals and appointments  In addition, I have reviewed and discussed with patient certain preventive protocols, quality metrics, and best practice recommendations. A written personalized care plan for preventive services as well as general preventive health recommendations were provided to patient.     Hal Hope, LPN   1/61/0960   Nurse Notes: none

## 2022-08-21 ENCOUNTER — Other Ambulatory Visit: Payer: Self-pay | Admitting: Internal Medicine

## 2022-08-21 DIAGNOSIS — K219 Gastro-esophageal reflux disease without esophagitis: Secondary | ICD-10-CM

## 2022-08-22 NOTE — Telephone Encounter (Signed)
Requested Prescriptions  Pending Prescriptions Disp Refills   pantoprazole (PROTONIX) 40 MG tablet [Pharmacy Med Name: Pantoprazole Sodium 40 MG Oral Tablet Delayed Release] 90 tablet 2    Sig: TAKE 1 TABLET BY MOUTH ONCE DAILY AT  6AM     Gastroenterology: Proton Pump Inhibitors Passed - 08/21/2022 12:58 PM      Passed - Valid encounter within last 12 months    Recent Outpatient Visits           5 months ago Type 2 diabetes mellitus with hyperglycemia, without long-term current use of insulin [facility])   [place] [facility] [place], Salvadore Oxford, NP   12 months ago Encounter for general adult medical examination with abnormal findings   [place] [facility] [place], Salvadore Oxford, NP   1 year ago Left sided sciatica   [place] [facility] Smitty Cords, DO   1 year ago Type 2 diabetes mellitus with hyperglycemia, without long-term current use of insulin [facility])   [place] [facility] [place], Salvadore Oxford, NP   1 year ago Essential hypertension   [place] [facility] [facility] [place], Salvadore Oxford, NP       Future Appointments             In 1 week Sam[place] Si, Salvadore Oxford, NP [place] [facility], [facility]

## 2022-08-29 ENCOUNTER — Encounter: Payer: Self-pay | Admitting: Internal Medicine

## 2022-08-29 ENCOUNTER — Ambulatory Visit (INDEPENDENT_AMBULATORY_CARE_PROVIDER_SITE_OTHER): Payer: PPO | Admitting: Internal Medicine

## 2022-08-29 VITALS — BP 164/72 | HR 63 | Temp 97.1°F | Wt 174.0 lb

## 2022-08-29 DIAGNOSIS — Z6829 Body mass index (BMI) 29.0-29.9, adult: Secondary | ICD-10-CM

## 2022-08-29 DIAGNOSIS — I10 Essential (primary) hypertension: Secondary | ICD-10-CM

## 2022-08-29 DIAGNOSIS — E663 Overweight: Secondary | ICD-10-CM

## 2022-08-29 DIAGNOSIS — Z0001 Encounter for general adult medical examination with abnormal findings: Secondary | ICD-10-CM | POA: Diagnosis not present

## 2022-08-29 DIAGNOSIS — E1165 Type 2 diabetes mellitus with hyperglycemia: Secondary | ICD-10-CM

## 2022-08-29 NOTE — Patient Instructions (Signed)
Health Maintenance for Postmenopausal Women Menopause is a normal process in which your ability to get pregnant comes to an end. This process happens slowly over many months or years, usually between the ages of 48 and 55. Menopause is complete when you have missed your menstrual period for 12 months. It is important to talk with your health care provider about some of the most common conditions that affect women after menopause (postmenopausal women). These include heart disease, cancer, and bone loss (osteoporosis). Adopting a healthy lifestyle and getting preventive care can help to promote your health and wellness. The actions you take can also lower your chances of developing some of these common conditions. What are the signs and symptoms of menopause? During menopause, you may have the following symptoms: Hot flashes. These can be moderate or severe. Night sweats. Decrease in sex drive. Mood swings. Headaches. Tiredness (fatigue). Irritability. Memory problems. Problems falling asleep or staying asleep. Talk with your health care provider about treatment options for your symptoms. Do I need hormone replacement therapy? Hormone replacement therapy is effective in treating symptoms that are caused by menopause, such as hot flashes and night sweats. Hormone replacement carries certain risks, especially as you become older. If you are thinking about using estrogen or estrogen with progestin, discuss the benefits and risks with your health care provider. How can I reduce my risk for heart disease and stroke? The risk of heart disease, heart attack, and stroke increases as you age. One of the causes may be a change in the body's hormones during menopause. This can affect how your body uses dietary fats, triglycerides, and cholesterol. Heart attack and stroke are medical emergencies. There are many things that you can do to help prevent heart disease and stroke. Watch your blood pressure High  blood pressure causes heart disease and increases the risk of stroke. This is more likely to develop in people who have high blood pressure readings or are overweight. Have your blood pressure checked: Every 3-5 years if you are 18-39 years of age. Every year if you are 40 years old or older. Eat a healthy diet  Eat a diet that includes plenty of vegetables, fruits, low-fat dairy products, and lean protein. Do not eat a lot of foods that are high in solid fats, added sugars, or sodium. Get regular exercise Get regular exercise. This is one of the most important things you can do for your health. Most adults should: Try to exercise for at least 150 minutes each week. The exercise should increase your heart rate and make you sweat (moderate-intensity exercise). Try to do strengthening exercises at least twice each week. Do these in addition to the moderate-intensity exercise. Spend less time sitting. Even light physical activity can be beneficial. Other tips Work with your health care provider to achieve or maintain a healthy weight. Do not use any products that contain nicotine or tobacco. These products include cigarettes, chewing tobacco, and vaping devices, such as e-cigarettes. If you need help quitting, ask your health care provider. Know your numbers. Ask your health care provider to check your cholesterol and your blood sugar (glucose). Continue to have your blood tested as directed by your health care provider. Do I need screening for cancer? Depending on your health history and family history, you may need to have cancer screenings at different stages of your life. This may include screening for: Breast cancer. Cervical cancer. Lung cancer. Colorectal cancer. What is my risk for osteoporosis? After menopause, you may be   at increased risk for osteoporosis. Osteoporosis is a condition in which bone destruction happens more quickly than new bone creation. To help prevent osteoporosis or  the bone fractures that can happen because of osteoporosis, you may take the following actions: If you are 19-50 years old, get at least 1,000 mg of calcium and at least 600 international units (IU) of vitamin D per day. If you are older than age 50 but younger than age 70, get at least 1,200 mg of calcium and at least 600 international units (IU) of vitamin D per day. If you are older than age 70, get at least 1,200 mg of calcium and at least 800 international units (IU) of vitamin D per day. Smoking and drinking excessive alcohol increase the risk of osteoporosis. Eat foods that are rich in calcium and vitamin D, and do weight-bearing exercises several times each week as directed by your health care provider. How does menopause affect my mental health? Depression may occur at any age, but it is more common as you become older. Common symptoms of depression include: Feeling depressed. Changes in sleep patterns. Changes in appetite or eating patterns. Feeling an overall lack of motivation or enjoyment of activities that you previously enjoyed. Frequent crying spells. Talk with your health care provider if you think that you are experiencing any of these symptoms. General instructions See your health care provider for regular wellness exams and vaccines. This may include: Scheduling regular health, dental, and eye exams. Getting and maintaining your vaccines. These include: Influenza vaccine. Get this vaccine each year before the flu season begins. Pneumonia vaccine. Shingles vaccine. Tetanus, diphtheria, and pertussis (Tdap) booster vaccine. Your health care provider may also recommend other immunizations. Tell your health care provider if you have ever been abused or do not feel safe at home. Summary Menopause is a normal process in which your ability to get pregnant comes to an end. This condition causes hot flashes, night sweats, decreased interest in sex, mood swings, headaches, or lack  of sleep. Treatment for this condition may include hormone replacement therapy. Take actions to keep yourself healthy, including exercising regularly, eating a healthy diet, watching your weight, and checking your blood pressure and blood sugar levels. Get screened for cancer and depression. Make sure that you are up to date with all your vaccines. This information is not intended to replace advice given to you by your health care provider. Make sure you discuss any questions you have with your health care provider. Document Revised: 08/02/2020 Document Reviewed: 08/02/2020 Elsevier Patient Education  2024 [employer]  

## 2022-08-29 NOTE — Assessment & Plan Note (Signed)
Encouraged diet and exercise for weight loss ?

## 2022-08-29 NOTE — Assessment & Plan Note (Signed)
Elevated today but she has not taken her BP meds this morning She will return this afternoon for BP check

## 2022-08-29 NOTE — Progress Notes (Signed)
Subjective:    Patient ID: Margaret Mcintosh, female    DOB: [birth year], 89 y.o.   MRN: [id number]  HPI  Patient presents to clinic today for her annual exam.  Flu: 02/2022 Tetanus: > 10 years ago COVID: Moderna x 2 Pneumovax: 03/2013 Prevnar: 02/2016 Shingrix: Never Pap smear: Hysterectomy Mammogram: >2 years ago Bone density: >2 years ago Colon screening: No longer screening Vision screening: annually Dentist: biannually  Diet: She does eat some meat. She consumes fruits and veggies. She does eat some fried foods. She drinks mostly water and coffee. Exercise: Walking  Review of Systems     Past Medical History:  Diagnosis Date   Arthritis    Cataract    Complication of anesthesia    "takes a long time to wake me up" (11/04/2015)   Diabetes mellitus (HCC)    GERD (gastroesophageal reflux disease)    Glaucoma    Hypertension    Stroke (HCC)    last 8/17. Mild left arm/leg weakness   TIA (transient ischemic attack) 11/04/2015    Current Outpatient Medications  Medication Sig Dispense Refill   amLODipine (NORVASC) 5 MG tablet Take 1 tablet by mouth once daily 90 tablet 1   aspirin 325 MG EC tablet Take 1 tablet by mouth daily.     Cholecalciferol (VITAMIN D3 PO) Take by mouth.     Elastic Bandages & Supports (MEDICAL COMPRESSION SOCKS) MISC 2 Devices by Does not apply route daily. 2 each 0   ferrous sulfate 324 MG TBEC Take 324 mg by mouth.     FREESTYLE LITE test strip 1 each by Other route daily. Use to test blood sugar up to 4 times daily. 90 each 4   labetalol (NORMODYNE) 100 MG tablet TAKE 1 TABLET BY MOUTH THREE TIMES DAILY 270 tablet 1   losartan (COZAAR) 50 MG tablet TAKE 1 TABLET BY MOUTH THREE TIMES DAILY WITH MEALS IN THE MORNING, AT NOON AND AT BEDTIME 270 tablet 1   metFORMIN (GLUCOPHAGE) 500 MG tablet TAKE 1 TABLET BY MOUTH TWICE DAILY WITH A MEAL 180 tablet 0   Multiple Vitamin (MULTIVITAMIN) tablet Take 1 tablet by mouth daily.     Multiple  Vitamins-Minerals (GNP ONE DAILY MENS/LYCOPENE) TABS      Omega-3 Fatty Acids (FISH OIL) 1200 MG CAPS Take by mouth 3 (three) times daily.     pantoprazole (PROTONIX) 40 MG tablet TAKE 1 TABLET BY MOUTH ONCE DAILY AT  6AM 90 tablet 2   simvastatin (ZOCOR) 20 MG tablet TAKE 1 TABLET BY MOUTH AT BEDTIME 90 tablet 2   No current facility-administered medications for this visit.    Allergies  Allergen Reactions   Codeine Other (See Comments)    Gets nausea and feels like"something is crawling" "somethings crawling in my head"    Diphenhydramine Hcl Other (See Comments)    Feels like skin crawling "somethings crawling in my head"    Other Other (See Comments)    CANNOT HAVE ANY "SPICY" FOODS!!    Family History  Problem Relation Age of Onset   Hypertension Mother    Diabetes Mother    Heart disease Mother    Cancer Sister    Diabetes Brother    Hypertension Daughter    Other Son        Pituitary gland disease   Hypertension Son    Obesity Son    Colon cancer Brother    Diabetes Brother    Alzheimer's disease Brother  Diabetes Sister     Social History   Socioeconomic History   Marital status: Widowed    Spouse name: Not on file   Number of children: 2   Years of education: Not on file   Highest education level: Not on file  Occupational History   Occupation: Retired  Tobacco Use   Smoking status: Never   Smokeless tobacco: Never  Vaping Use   Vaping Use: Never used  Substance and Sexual Activity   Alcohol use: No   Drug use: No   Sexual activity: Not Currently  Other Topics Concern   Not on file  Social History Narrative   Not on file   Social Determinants of Health   Financial Resource Strain: Low Risk  (08/10/2022)   Overall Financial Resource Strain (CARDIA)    Difficulty of Paying Living Expenses: Not hard at all  Food Insecurity: No Food Insecurity (08/10/2022)   Hunger Vital Sign    Worried About Running Out of Food in the Last Year: Never true     Ran Out of Food in the Last Year: Never true  Transportation Needs: No Transportation Needs (08/10/2022)   PRAPARE - Administrator, Civil Service (Medical): No    Lack of Transportation (Non-Medical): No  Physical Activity: Insufficiently Active (08/10/2022)   Exercise Vital Sign    Days of Exercise per Week: 2 days    Minutes of Exercise per Session: 20 min  Stress: No Stress Concern Present (08/10/2022)   [employer] of Occupational Health - Occupational Stress Questionnaire    Feeling of Stress : Not at all  Social Connections: Moderately Isolated (08/10/2022)   Social Connection and Isolation Panel [NHANES]    Frequency of Communication with Friends and Family: More than three times a week    Frequency of Social Gatherings with Friends and Family: Once a week    Attends Religious Services: More than 4 times per year    Active Member of [employer] or Organizations: No    Attends Banker Meetings: Never    Marital Status: Widowed  Intimate Partner Violence: Not At Risk (08/10/2022)   Humiliation, Afraid, Rape, and Kick questionnaire    Fear of Current or Ex-Partner: No    Emotionally Abused: No    Physically Abused: No    Sexually Abused: No     Constitutional: Denies fever, malaise, fatigue, headache or abrupt weight changes.  HEENT: Denies eye pain, eye redness, ear pain, ringing in the ears, wax buildup, runny nose, nasal congestion, bloody nose, or sore throat. Respiratory: Denies difficulty breathing, shortness of breath, cough or sputum production.   Cardiovascular: Denies chest pain, chest tightness, palpitations or swelling in the hands or feet.  Gastrointestinal: Denies abdominal pain, bloating, constipation, diarrhea or blood in the stool.  GU: Pt reports urgency. Denies frequency, pain with urination, burning sensation, blood in urine, odor or discharge. Musculoskeletal: Denies decrease in range of motion, difficulty with gait, muscle pain or  joint pain and swelling.  Skin: Pt reports scab to left lower leg. Denies redness, rashes, or ulcercations.  Neurological: Denies dizziness, difficulty with memory, difficulty with speech or problems with balance and coordination.  Psych: Denies anxiety, depression, SI/HI.  No other specific complaints in a complete review of systems (except as listed in HPI above).  Objective:   Physical Exam   BP (!) 164/72 (BP Location: Right Arm, Patient Position: Sitting, Cuff Size: Normal)   Pulse 63   Temp (!) 97.1 F (36.2  C) (Temporal)   Wt 174 lb (78.9 kg)   SpO2 96%   BMI 29.87 kg/m   Wt Readings from Last 3 Encounters:  08/10/22 173 lb (78.5 kg)  02/27/22 173 lb (78.5 kg)  08/25/21 171 lb (77.6 kg)    General: Appears her stated age, overweight, in NAD. Skin: Warm, dry and intact. Scabbed area noted to left lower leg just above ankle. HEENT: Head: normal shape and size; Eyes: sclera white, no icterus, conjunctiva pink, PERRLA and EOMs intact;  Neck:  Neck supple, trachea midline. No masses, lumps or thyromegaly present.  Cardiovascular: Normal rate and rhythm. S1,S2 noted.  No murmur, rubs or gallops noted. No JVD or BLE edema. No carotid bruits noted. Pulmonary/Chest: Normal effort and positive vesicular breath sounds. No respiratory distress. No wheezes, rales or ronchi noted.  Abdomen: Normal bowel sounds.  Musculoskeletal: Strength 5/5BUE/BLE. No difficulty with gait.  Neurological: Alert and oriented. Cranial nerves II-XII grossly intact. Coordination normal.  Psychiatric: Mood and affect normal. Behavior is normal. Judgment and thought content normal.    BMET    Component Value Date/Time   NA 141 02/27/2022 0932   NA 135 (L) 04/09/2013 1025   K 4.9 02/27/2022 0932   K 3.8 04/09/2013 1025   CL 105 02/27/2022 0932   CL 103 04/09/2013 1025   CO2 28 02/27/2022 0932   CO2 28 04/09/2013 1025   GLUCOSE 125 (H) 02/27/2022 0932   GLUCOSE 211 (H) 04/09/2013 1025   BUN 24  02/27/2022 0932   BUN 14 04/09/2013 1025   CREATININE 1.10 (H) 02/27/2022 0932   CALCIUM 10.2 02/27/2022 0932   CALCIUM 9.2 04/09/2013 1025   GFRNONAA 58 (L) 11/22/2020 0827   GFRNONAA 58 (L) 11/18/2019 1010   GFRAA 67 11/18/2019 1010    Lipid Panel     Component Value Date/Time   CHOL 138 02/27/2022 0932   TRIG 79 02/27/2022 0932   HDL 62 02/27/2022 0932   CHOLHDL 2.2 02/27/2022 0932   VLDL 23 11/05/2015 0549   LDLCALC 60 02/27/2022 0932    CBC    Component Value Date/Time   WBC 4.5 02/27/2022 0932   RBC 3.91 02/27/2022 0932   HGB 10.1 (L) 02/27/2022 0932   HGB 14.9 04/09/2013 1025   HCT 32.6 (L) 02/27/2022 0932   HCT 44.4 04/09/2013 1025   PLT 203 02/27/2022 0932   PLT 184 04/09/2013 1025   MCV 83.4 02/27/2022 0932   MCV 89 04/09/2013 1025   MCH 25.8 (L) 02/27/2022 0932   MCHC 31.0 (L) 02/27/2022 0932   RDW 13.2 02/27/2022 0932   RDW 13.5 04/09/2013 1025   LYMPHSABS 1.0 11/22/2020 0827   MONOABS 0.4 11/22/2020 0827   EOSABS 0.1 11/22/2020 0827   BASOSABS 0.0 11/22/2020 0827    Hgb A1C Lab Results  Component Value Date   HGBA1C 6.5 (A) 02/27/2022           Assessment & Plan:   Preventative Health Maintenance:  Encouraged her to get a flu shot in the fall She declines tetanus for financial reasons, advised if she gets bit or cut to go get this done She declines Prevnar 20 today, Prevnar 13 and Pneumovax UTD Discussed Shingrix vaccine, she will check coverage with her insurance company and schedule a visit if she would like to have this done She no longer needs Pap smears She does not want to screen for breast cancer She does not want to screen for osteoporosis She no longer wants to  screen for colon cancer Encouraged her to consume a balanced diet and exercise regimen Advised her to see and eye doctor and dentist annually We will check CBC, c-Met, lipid, A1c and urine microalbumin today  RTC in 6 months, follow-up chronic conditions Nicki Reaper, NP

## 2022-08-30 LAB — CBC
HCT: 36 % (ref 35.0–45.0)
Hemoglobin: 11.5 g/dL — ABNORMAL LOW (ref 11.7–15.5)
MCH: 29.1 pg (ref 27.0–33.0)
MCHC: 31.9 g/dL — ABNORMAL LOW (ref 32.0–36.0)
MCV: 91.1 fL (ref 80.0–100.0)
MPV: 12.2 fL (ref 7.5–12.5)
Platelets: 164 10*3/uL (ref 140–400)
RBC: 3.95 10*6/uL (ref 3.80–5.10)
RDW: 13.4 % (ref 11.0–15.0)
WBC: 3.9 10*3/uL (ref 3.8–10.8)

## 2022-08-30 LAB — COMPLETE METABOLIC PANEL WITH GFR
AG Ratio: 1.8 (calc) (ref 1.0–2.5)
ALT: 10 U/L (ref 6–29)
AST: 12 U/L (ref 10–35)
Albumin: 4.3 g/dL (ref 3.6–5.1)
Alkaline phosphatase (APISO): 59 U/L (ref 37–153)
BUN: 12 mg/dL (ref 7–25)
CO2: 29 mmol/L (ref 20–32)
Calcium: 9.6 mg/dL (ref 8.6–10.4)
Chloride: 104 mmol/L (ref 98–110)
Creat: 0.92 mg/dL (ref 0.60–0.95)
Globulin: 2.4 g/dL (calc) (ref 1.9–3.7)
Glucose, Bld: 111 mg/dL — ABNORMAL HIGH (ref 65–99)
Potassium: 4.6 mmol/L (ref 3.5–5.3)
Sodium: 141 mmol/L (ref 135–146)
Total Bilirubin: 0.6 mg/dL (ref 0.2–1.2)
Total Protein: 6.7 g/dL (ref 6.1–8.1)
eGFR: 60 mL/min/{1.73_m2} (ref >=60)

## 2022-08-30 LAB — LIPID PANEL
Cholesterol: 125 mg/dL (ref <200)
HDL: 60 mg/dL (ref >=50)
LDL Cholesterol (Calc): 51 mg/dL (calc)
Non-HDL Cholesterol (Calc): 65 mg/dL (calc) (ref <130)
Total CHOL/HDL Ratio: 2.1 (calc) (ref <5.0)
Triglycerides: 64 mg/dL (ref <150)

## 2022-08-30 LAB — MICROALBUMIN / CREATININE URINE RATIO
Creatinine, Urine: 34 mg/dL (ref 20–275)
Microalb Creat Ratio: 47 mg/g creat — ABNORMAL HIGH (ref <30)
Microalb, Ur: 1.6 mg/dL

## 2022-08-30 LAB — HEMOGLOBIN A1C
Hgb A1c MFr Bld: 6.2 % of total Hgb — ABNORMAL HIGH (ref <5.7)
Mean Plasma Glucose: 131 mg/dL
eAG (mmol/L): 7.3 mmol/L

## 2022-08-31 ENCOUNTER — Encounter: Payer: Self-pay | Admitting: Internal Medicine

## 2022-10-02 ENCOUNTER — Other Ambulatory Visit: Payer: Self-pay | Admitting: Internal Medicine

## 2022-10-02 DIAGNOSIS — E119 Type 2 diabetes mellitus without complications: Secondary | ICD-10-CM

## 2022-10-03 NOTE — Telephone Encounter (Signed)
Requested Prescriptions  Pending Prescriptions Disp Refills   metFORMIN (GLUCOPHAGE) 500 MG tablet [Pharmacy Med Name: metFORMIN HCl 500 MG Oral Tablet] 180 tablet 1    Sig: TAKE 1 TABLET BY MOUTH TWICE DAILY WITH A MEAL     Endocrinology:  Diabetes - Biguanides Failed - 10/02/2022 11:46 AM      Failed - B12 Level in normal range and within 720 days    No results found for: "VITAMINB12"       Failed - CBC within normal limits and completed in the last 12 months    WBC  Date Value Ref Range Status  08/29/2022 3.9 3.8 - 10.8 Thousand/uL Final   RBC  Date Value Ref Range Status  08/29/2022 3.95 3.80 - 5.10 Million/uL Final   Hemoglobin  Date Value Ref Range Status  08/29/2022 11.5 (L) 11.7 - 15.5 g/dL Final   HGB  Date Value Ref Range Status  04/09/2013 14.9 12.0 - 16.0 g/dL Final   HCT  Date Value Ref Range Status  08/29/2022 36.0 35.0 - 45.0 % Final  04/09/2013 44.4 35.0 - 47.0 % Final   MCHC  Date Value Ref Range Status  08/29/2022 31.9 (L) 32.0 - 36.0 g/dL Final   [facility]  Date Value Ref Range Status  08/29/2022 29.1 27.0 - 33.0 pg Final   MCV  Date Value Ref Range Status  08/29/2022 91.1 80.0 - 100.0 fL Final  04/09/2013 89 80 - 100 fL Final   No results found for: "PLTCOUNTKUC", "LABPLAT", "POCPLA" RDW  Date Value Ref Range Status  08/29/2022 13.4 11.0 - 15.0 % Final  04/09/2013 13.5 11.5 - 14.5 % Final         Passed - Cr in normal range and within 360 days    Creat  Date Value Ref Range Status  08/29/2022 0.92 0.60 - 0.95 mg/dL Final   Creatinine, Urine  Date Value Ref Range Status  08/29/2022 34 20 - 275 mg/dL Final         Passed - HBA1C is between 0 and 7.9 and within 180 days    Hgb A1c MFr Bld  Date Value Ref Range Status  08/29/2022 6.2 (H) <5.7 % of total Hgb Final    Comment:    For someone without known diabetes, a hemoglobin  A1c value between 5.7% and 6.4% is consistent with prediabetes and should be confirmed with a  follow-up  test. . For someone with known diabetes, a value <7% indicates that their diabetes is well controlled. A1c targets should be individualized based on duration of diabetes, age, comorbid conditions, and other considerations. . This assay result is consistent with an increased risk of diabetes. . Currently, no consensus exists regarding use of hemoglobin A1c for diagnosis of diabetes for children. .          Passed - eGFR in normal range and within 360 days    GFR, Est African American  Date Value Ref Range Status  11/18/2019 67 > OR = 60 mL/min/1.56m2 Final   GFR, Est Non African American  Date Value Ref Range Status  11/18/2019 58 (L) > OR = 60 mL/min/1.3m2 Final   GFR, Estimated  Date Value Ref Range Status  11/22/2020 58 (L) >60 mL/min Final    Comment:    (NOTE) Calculated using the CKD-EPI Creatinine Equation (2021)    eGFR  Date Value Ref Range Status  08/29/2022 60 > OR = 60 mL/min/1.55m2 Final  Passed - Valid encounter within last 6 months    Recent Outpatient Visits           1 month ago Encounter for general adult medical examination with abnormal findings   [place] [facility] [place], [place] W, NP   7 months ago Type 2 diabetes mellitus with hyperglycemia, without long-term current use of insulin [facility])   [place] [facility] [place], Salvadore Oxford, NP   1 year ago Encounter for general adult medical examination with abnormal findings   [place] [facility] [place], Salvadore Oxford, NP   1 year ago Left sided sciatica   [place] [facility] Smit[place] Cords, DO   1 year ago Type 2 diabetes mellitus with hyperglycemia, without long-term current use of insulin [facility])   [place] [facility] [place], Salvadore Oxford, NP       Future Appointments             In 4 months Baity, Salvadore Oxford, NP [place] [facility], [facility]

## 2022-11-15 ENCOUNTER — Other Ambulatory Visit: Payer: Self-pay | Admitting: Internal Medicine

## 2022-11-15 DIAGNOSIS — I10 Essential (primary) hypertension: Secondary | ICD-10-CM

## 2022-11-16 NOTE — Telephone Encounter (Signed)
Requested Prescriptions  Pending Prescriptions Disp Refills   amLODipine (NORVASC) 5 MG tablet [Pharmacy Med Name: amLODIPine Besylate 5 MG Oral Tablet] 90 tablet 1    Sig: Take 1 tablet by mouth once daily     Cardiovascular: Calcium Channel Blockers 2 Failed - 11/15/2022  5:12 PM      Failed - Last BP in normal range    BP Readings from Last 1 Encounters:  08/29/22 (!) 164/72         Passed - Last Heart Rate in normal range    Pulse Readings from Last 1 Encounters:  08/29/22 63         Passed - Valid encounter within last 6 months    Recent Outpatient Visits           2 months ago Encounter for general adult medical examination with abnormal findings   [facility] [place], [place] W, NP   8 months ago Type 2 diabetes mellitus with hyperglycemia, without long-term current use of insulin [facility])   [place] [facility] [place], Salvadore Oxford, NP   1 year ago Encounter for general adult medical examination with abnormal findings   [place] [facility] [place], Salvadore Oxford, NP   1 year ago Left sided sciatica   [place] [facility] Smitty Cords, DO   1 year ago Type 2 diabetes mellitus with hyperglycemia, without long-term current use of insulin [facility])   [place] [facility], Salvadore Oxford, NP       Future Appointments             In 3 months Baity, Salvadore Oxford, NP [place] [facility], [facility]

## 2022-11-19 ENCOUNTER — Other Ambulatory Visit: Payer: Self-pay | Admitting: Internal Medicine

## 2022-11-19 DIAGNOSIS — E782 Mixed hyperlipidemia: Secondary | ICD-10-CM

## 2022-11-19 DIAGNOSIS — I10 Essential (primary) hypertension: Secondary | ICD-10-CM

## 2022-11-21 NOTE — Telephone Encounter (Signed)
Requested Prescriptions  Pending Prescriptions Disp Refills   simvastatin (ZOCOR) 20 MG tablet [Pharmacy Med Name: Simvastatin 20 MG Oral Tablet] 90 tablet 0    Sig: TAKE 1 TABLET BY MOUTH AT BEDTIME     Cardiovascular:  Antilipid - Statins Failed - 11/19/2022  1:26 PM      Failed - Lipid Panel in normal range within the last 12 months    Cholesterol  Date Value Ref Range Status  08/29/2022 125 <200 mg/dL Final   LDL Cholesterol (Calc)  Date Value Ref Range Status  08/29/2022 51 mg/dL (calc) Final    Comment:    Reference range: <100 . Desirable range <100 mg/dL for primary prevention;   <70 mg/dL for patients with CHD or diabetic patients  with > or = 2 CHD risk factors. Marland Kitchen LDL-C is now calculated using the Martin-Hopkins  calculation, which is a validated novel method providing  better accuracy than the Friedewald equation in the  estimation of LDL-C.  Horald Pollen et al. Lenox Ahr. 1610;960(45): 2061-2068  (http://education.[employer].com/faq/FAQ164)    HDL  Date Value Ref Range Status  08/29/2022 60 > OR = 50 mg/dL Final   Triglycerides  Date Value Ref Range Status  08/29/2022 64 <150 mg/dL Final         Passed - Patient is not pregnant      Passed - Valid encounter within last 12 months    Recent Outpatient Visits           2 months ago Encounter for general adult medical examination with abnormal findings   [place] [facility] [place], [place] W, NP   8 months ago Type 2 diabetes mellitus with hyperglycemia, without long-term current use of insulin [facility])   [place] [facility] [place], Salvadore Oxford, NP   1 year ago Encounter for general adult medical examination with abnormal findings   [place] [facility] [place], Salvadore Oxford, NP   1 year ago Left sided sciatica   [place] [facility] Smit[place] Cords, DO   1 year ago Type 2 diabetes mellitus with hyperglycemia, without long-term current  use of insulin [facility])   [place] [facility] [place], Salvadore Oxford, NP       Future Appointments             In 3 months [place], Salvadore Oxford, NP [place] [facility]             labetalol (NORMODYNE) 100 MG tablet [Pharmacy Med Name: Labetalol HCl 100 MG Oral Tablet] 270 tablet 0    Sig: TAKE 1 TABLET BY MOUTH THREE TIMES DAILY     Cardiovascular:  Beta Blockers Failed - 11/19/2022  1:26 PM      Failed - Last BP in normal range    BP Readings from Last 1 Encounters:  08/29/22 (!) 164/72         Passed - Last Heart Rate in normal range    Pulse Readings from Last 1 Encounters:  08/29/22 63         Passed - Valid encounter within last 6 months    Recent Outpatient Visits           2 months ago Encounter for general adult medical examination with abnormal findings   [place] [facility] [place], [place], NP   8 months ago Type 2 diabetes mellitus with hyperglycemia, without long-term current use  of insulin [facility])   [place] [facility] [place], Salvadore Oxford, NP   1 year ago Encounter for general adult medical examination with abnormal findings   [place] [facility] [place], Salvadore Oxford, NP   1 year ago Left sided sciatica   [place] [facility] Smitty Cords, DO   1 year ago Type 2 diabetes mellitus with hyperglycemia, without long-term current use of insulin [facility])   [place] [facility] [place], Salvadore Oxford, NP       Future Appointments             In 3 months Baity, Salvadore Oxford, NP [place] [facility], [facility]

## 2022-12-06 LAB — HM DIABETES EYE EXAM

## 2023-02-28 ENCOUNTER — Encounter: Payer: Self-pay | Admitting: Internal Medicine

## 2023-02-28 ENCOUNTER — Ambulatory Visit (INDEPENDENT_AMBULATORY_CARE_PROVIDER_SITE_OTHER): Payer: PPO | Admitting: Internal Medicine

## 2023-02-28 VITALS — BP 200/94 | Ht 64.0 in | Wt 169.0 lb

## 2023-02-28 DIAGNOSIS — E785 Hyperlipidemia, unspecified: Secondary | ICD-10-CM

## 2023-02-28 DIAGNOSIS — E1165 Type 2 diabetes mellitus with hyperglycemia: Secondary | ICD-10-CM

## 2023-02-28 DIAGNOSIS — Z6829 Body mass index (BMI) 29.0-29.9, adult: Secondary | ICD-10-CM

## 2023-02-28 DIAGNOSIS — K219 Gastro-esophageal reflux disease without esophagitis: Secondary | ICD-10-CM

## 2023-02-28 DIAGNOSIS — Z23 Encounter for immunization: Secondary | ICD-10-CM

## 2023-02-28 DIAGNOSIS — E782 Mixed hyperlipidemia: Secondary | ICD-10-CM

## 2023-02-28 DIAGNOSIS — I10 Essential (primary) hypertension: Secondary | ICD-10-CM | POA: Diagnosis not present

## 2023-02-28 DIAGNOSIS — E1169 Type 2 diabetes mellitus with other specified complication: Secondary | ICD-10-CM

## 2023-02-28 DIAGNOSIS — N1831 Chronic kidney disease, stage 3a: Secondary | ICD-10-CM

## 2023-02-28 DIAGNOSIS — I6930 Unspecified sequelae of cerebral infarction: Secondary | ICD-10-CM

## 2023-02-28 DIAGNOSIS — E663 Overweight: Secondary | ICD-10-CM

## 2023-02-28 LAB — COMPLETE METABOLIC PANEL WITH GFR
AG Ratio: 1.7 (calc) (ref 1.0–2.5)
ALT: 12 U/L (ref 6–29)
AST: 16 U/L (ref 10–35)
Albumin: 4.4 g/dL (ref 3.6–5.1)
Alkaline phosphatase (APISO): 68 U/L (ref 37–153)
BUN: 13 mg/dL (ref 7–25)
CO2: 25 mmol/L (ref 20–32)
Calcium: 9.7 mg/dL (ref 8.6–10.4)
Chloride: 105 mmol/L (ref 98–110)
Creat: 0.85 mg/dL (ref 0.60–0.95)
Globulin: 2.6 g/dL (ref 1.9–3.7)
Glucose, Bld: 123 mg/dL — ABNORMAL HIGH (ref 65–99)
Potassium: 3.9 mmol/L (ref 3.5–5.3)
Sodium: 141 mmol/L (ref 135–146)
Total Bilirubin: 0.7 mg/dL (ref 0.2–1.2)
Total Protein: 7 g/dL (ref 6.1–8.1)
eGFR: 66 mL/min/{1.73_m2} (ref >=60)

## 2023-02-28 LAB — CBC
HCT: 35.5 % (ref 35.0–45.0)
Hemoglobin: 11.5 g/dL — ABNORMAL LOW (ref 11.7–15.5)
MCH: 29.9 pg (ref 27.0–33.0)
MCHC: 32.4 g/dL (ref 32.0–36.0)
MCV: 92.4 fL (ref 80.0–100.0)
MPV: 12.2 fL (ref 7.5–12.5)
Platelets: 187 10*3/uL (ref 140–400)
RBC: 3.84 10*6/uL (ref 3.80–5.10)
RDW: 12.7 % (ref 11.0–15.0)
WBC: 3.1 10*3/uL — ABNORMAL LOW (ref 3.8–10.8)

## 2023-02-28 LAB — LIPID PANEL
Cholesterol: 147 mg/dL (ref <200)
HDL: 64 mg/dL (ref >=50)
LDL Cholesterol (Calc): 68 mg/dL
Non-HDL Cholesterol (Calc): 83 mg/dL (ref <130)
Total CHOL/HDL Ratio: 2.3 (calc) (ref <5.0)
Triglycerides: 71 mg/dL (ref <150)

## 2023-02-28 LAB — POCT GLYCOSYLATED HEMOGLOBIN (HGB A1C): Hemoglobin A1C: 6.1 % — AB (ref 4.0–5.6)

## 2023-02-28 MED ORDER — SIMVASTATIN 20 MG PO TABS: 20.0000 mg | ORAL_TABLET | Freq: Every day | ORAL | 1 refills | Status: DC

## 2023-02-28 MED ORDER — LABETALOL HCL 100 MG PO TABS: 100.0000 mg | ORAL_TABLET | Freq: Three times a day (TID) | ORAL | 1 refills | Status: DC

## 2023-02-28 NOTE — Assessment & Plan Note (Signed)
POCT A1c 6.1% Urine microalbumin has been checked within the last year Continue metformin Encourage low-carb diet and exercise for weight loss Encourage routine eye exam Encourage routine foot exam Flu shot today UTD Pneumovax and Prevnar 13 UTD Prevnar 20 today Encouraged her to get her COVID booster

## 2023-02-28 NOTE — Assessment & Plan Note (Signed)
Try to avoid foods that trigger reflux Encourage weight also as this can help reduce reflux symptoms Continue pantoprazole

## 2023-02-28 NOTE — Progress Notes (Signed)
Subjective:    Patient ID: Margaret Mcintosh, female    DOB: 07/09/1932, 89 y.o.   MRN: [id number]  HPI  Patient presents to clinic today for follow-up of chronic conditions.  HTN: Her BP today is 188/64.  She is taking amlodipine, labetalol and losartan as prescribed but she admits she did not take her blood pressure medication this morning.  ECG from 10/2020 reviewed.  HLD with history of stroke: Her last LDL was 51, triglycerides 64, 08/2022.  She denies myalgias on simvastatin and fish oil.  She is taking aspirin as well.  She tries to consume a low-fat diet.  GERD: Triggered by acidic foods.  She denies breakthrough on pantoprazole. She also sleeps with a pillow wedge that has seemed to help.  There is no upper GI on file.  DM2: Her last A1c was 6.2%, 08/2022.  She is taking metformin as prescribed.  She does not check her sugars.  She checks her feet routinely.  Her last eye exam was 11/2022.  Flu 02/2022.  Pneumovax 03/2013.  Prevnar 13 02/2016.  COVID Moderna x2.  CKD: Her last creatinine was 0.92, GFR 60, 08/2022.  She is on losartan for renal protection.  She does not follow with nephrology.  Review of Systems  Past Medical History:  Diagnosis Date   Arthritis    Cataract    Complication of anesthesia    "takes a long time to wake me up" (11/04/2015)   Diabetes mellitus (HCC)    GERD (gastroesophageal reflux disease)    Glaucoma    Hypertension    Stroke (HCC)    last 8/17. Mild left arm/leg weakness   TIA (transient ischemic attack) 11/04/2015    Current Outpatient Medications  Medication Sig Dispense Refill   amLODipine (NORVASC) 5 MG tablet Take 1 tablet by mouth once daily 90 tablet 1   aspirin 325 MG EC tablet Take 1 tablet by mouth daily.     Cholecalciferol (VITAMIN D3 PO) Take by mouth.     Elastic Bandages & Supports (MEDICAL COMPRESSION SOCKS) MISC 2 Devices by Does not apply route daily. 2 each 0   ferrous sulfate 324 MG TBEC Take 324 mg by mouth.      FREESTYLE LITE test strip 1 each by Other route daily. Use to test blood sugar up to 4 times daily. 90 each 4   labetalol (NORMODYNE) 100 MG tablet TAKE 1 TABLET BY MOUTH THREE TIMES DAILY 270 tablet 0   losartan (COZAAR) 50 MG tablet TAKE 1 TABLET BY MOUTH THREE TIMES DAILY WITH MEALS IN THE MORNING, AT NOON AND AT BEDTIME 270 tablet 1   metFORMIN (GLUCOPHAGE) 500 MG tablet TAKE 1 TABLET BY MOUTH TWICE DAILY WITH A MEAL 180 tablet 1   Multiple Vitamin (MULTIVITAMIN) tablet Take 1 tablet by mouth daily.     Multiple Vitamins-Minerals (GNP ONE DAILY MENS/LYCOPENE) TABS      Omega-3 Fatty Acids (FISH OIL) 1200 MG CAPS Take by mouth 3 (three) times daily.     pantoprazole (PROTONIX) 40 MG tablet TAKE 1 TABLET BY MOUTH ONCE DAILY AT  6AM 90 tablet 2   simvastatin (ZOCOR) 20 MG tablet TAKE 1 TABLET BY MOUTH AT BEDTIME 90 tablet 0   No current facility-administered medications for this visit.    Allergies  Allergen Reactions   Codeine Other (See Comments)    Gets nausea and feels like"something is crawling" "somethings crawling in my head"    Diphenhydramine Hcl Other (See Comments)  Feels like skin crawling "somethings crawling in my head"    Other Other (See Comments)    CANNOT HAVE ANY "SPICY" FOODS!!    Family History  Problem Relation Age of Onset   Hypertension Mother    Diabetes Mother    Heart disease Mother    Cancer Sister    Diabetes Brother    Hypertension Daughter    Other Son        Pituitary gland disease   Hypertension Son    Obesity Son    Colon cancer Brother    Diabetes Brother    Alzheimer's disease Brother    Diabetes Sister     Social History   Socioeconomic History   Marital status: Widowed    Spouse name: Not on file   Number of children: 2   Years of education: Not on file   Highest education level: Not on file  Occupational History   Occupation: Retired  Tobacco Use   Smoking status: Never   Smokeless tobacco: Never  Vaping Use   Vaping  status: Never Used  Substance and Sexual Activity   Alcohol use: No   Drug use: No   Sexual activity: Not Currently  Other Topics Concern   Not on file  Social History Narrative   Not on file   Social Determinants of Health   Financial Resource Strain: Low Risk  (08/10/2022)   Overall Financial Resource Strain (CARDIA)    Difficulty of Paying Living Expenses: Not hard at all  Food Insecurity: No Food Insecurity (08/10/2022)   Hunger Vital Sign    Worried About Running Out of Food in the Last Year: Never true    Ran Out of Food in the Last Year: Never true  Transportation Needs: No Transportation Needs (08/10/2022)   PRAPARE - Administrator, Civil Service (Medical): No    Lack of Transportation (Non-Medical): No  Physical Activity: Insufficiently Active (08/10/2022)   Exercise Vital Sign    Days of Exercise per Week: 2 days    Minutes of Exercise per Session: 20 min  Stress: No Stress Concern Present (08/10/2022)   [employer] of Occupational Health - Occupational Stress Questionnaire    Feeling of Stress : Not at all  Social Connections: Moderately Isolated (08/10/2022)   Social Connection and Isolation Panel [NHANES]    Frequency of Communication with Friends and Family: More than three times a week    Frequency of Social Gatherings with Friends and Family: Once a week    Attends Religious Services: More than 4 times per year    Active Member of [employer] or Organizations: No    Attends Banker Meetings: Never    Marital Status: Widowed  Intimate Partner Violence: Not At Risk (08/10/2022)   Humiliation, Afraid, Rape, and Kick questionnaire    Fear of Current or Ex-Partner: No    Emotionally Abused: No    Physically Abused: No    Sexually Abused: No     Constitutional: Denies fever, malaise, fatigue, headache or abrupt weight changes.  HEENT: Denies eye pain, eye redness, ear pain, ringing in the ears, wax buildup, runny nose, nasal congestion,  bloody nose, or sore throat. Respiratory: Denies difficulty breathing, shortness of breath, cough or sputum production.   Cardiovascular: Denies chest pain, chest tightness, palpitations or swelling in the hands or feet.  Gastrointestinal: Denies abdominal pain, bloating, constipation, diarrhea or blood in the stool.  GU: Denies urgency, frequency, pain with urination, burning sensation, blood  in urine, odor or discharge. Musculoskeletal: Denies decrease in range of motion, difficulty with gait, or joint pain or swelling.  Skin: Denies redness, rashes, lesions or ulcercations.  Neurological: Denies dizziness, difficulty with memory, difficulty with speech or problems with balance and coordination.  Psych: Denies anxiety, depression, SI/HI.  No other specific complaints in a complete review of systems (except as listed in HPI above).     Objective:   Physical Exam  BP (!) 200/94   Ht 5\' 4"  (1.626 m)   Wt 169 lb (76.7 kg)   BMI 29.01 kg/m    Wt Readings from Last 3 Encounters:  08/29/22 174 lb (78.9 kg)  08/10/22 173 lb (78.5 kg)  02/27/22 173 lb (78.5 kg)    General: Appears her stated age, overweight, in NAD. Skin: Warm, dry and intact.  HEENT: Head: normal shape and size; Eyes: sclera white, no icterus, conjunctiva pink, PERRLA and EOMs intact;   Cardiovascular: Normal rate and rhythm. S1,S2 noted.  Murmur noted. No JVD. Trace nonpitting BLE edema. No carotid bruits noted. Pulmonary/Chest: Normal effort and positive vesicular breath sounds. No respiratory distress. No wheezes, rales or ronchi noted.  Abdomen: Soft and nontender. Normal bowel sounds.  Musculoskeletal:  No difficulty with gait.  Neurological: Alert and oriented. Coordination normal.     BMET    Component Value Date/Time   NA 141 08/29/2022 0918   NA 135 (L) 04/09/2013 1025   K 4.6 08/29/2022 0918   K 3.8 04/09/2013 1025   CL 104 08/29/2022 0918   CL 103 04/09/2013 1025   CO2 29 08/29/2022 0918   CO2  28 04/09/2013 1025   GLUCOSE 111 (H) 08/29/2022 0918   GLUCOSE 211 (H) 04/09/2013 1025   BUN 12 08/29/2022 0918   BUN 14 04/09/2013 1025   CREATININE 0.92 08/29/2022 0918   CALCIUM 9.6 08/29/2022 0918   CALCIUM 9.2 04/09/2013 1025   GFRNONAA 58 (L) 11/22/2020 0827   GFRNONAA 58 (L) 11/18/2019 1010   GFRAA 67 11/18/2019 1010    Lipid Panel     Component Value Date/Time   CHOL 125 08/29/2022 0918   TRIG 64 08/29/2022 0918   HDL 60 08/29/2022 0918   CHOLHDL 2.1 08/29/2022 0918   VLDL 23 11/05/2015 0549   LDLCALC 51 08/29/2022 0918    CBC    Component Value Date/Time   WBC 3.9 08/29/2022 0918   RBC 3.95 08/29/2022 0918   HGB 11.5 (L) 08/29/2022 0918   HGB 14.9 04/09/2013 1025   HCT 36.0 08/29/2022 0918   HCT 44.4 04/09/2013 1025   PLT 164 08/29/2022 0918   PLT 184 04/09/2013 1025   MCV 91.1 08/29/2022 0918   MCV 89 04/09/2013 1025   MCH 29.1 08/29/2022 0918   MCHC 31.9 (L) 08/29/2022 0918   RDW 13.4 08/29/2022 0918   RDW 13.5 04/09/2013 1025   LYMPHSABS 1.0 11/22/2020 0827   MONOABS 0.4 11/22/2020 0827   EOSABS 0.1 11/22/2020 0827   BASOSABS 0.0 11/22/2020 0827    Hgb A1C Lab Results  Component Value Date   HGBA1C 6.2 (H) 08/29/2022           Assessment & Plan:     RTC in 2 weeks followup HTN,  6 months for your annual exam Nicki Reaper, NP

## 2023-02-28 NOTE — Assessment & Plan Note (Signed)
C-Met and lipid profile today Encouraged her to consume a low-fat diet Continue simvastatin 

## 2023-02-28 NOTE — Addendum Note (Signed)
Addended by: Luanna Cole D on: 02/28/2023 09:30 AM   Modules accepted: Orders

## 2023-02-28 NOTE — Patient Instructions (Signed)
Hypertension, Adult High blood pressure (hypertension) is when the force of blood pumping through the arteries is too strong. The arteries are the blood vessels that carry blood from the heart throughout the body. Hypertension forces the heart to work harder to pump blood and may cause arteries to become narrow or stiff. Untreated or uncontrolled hypertension can lead to a heart attack, heart failure, a stroke, kidney disease, and other problems. A blood pressure reading consists of a higher number over a lower number. Ideally, your blood pressure should be below 120/80. The first ("top") number is called the systolic pressure. It is a measure of the pressure in your arteries as your heart beats. The second ("bottom") number is called the diastolic pressure. It is a measure of the pressure in your arteries as the heart relaxes. What are the causes? The exact cause of this condition is not known. There are some conditions that result in high blood pressure. What increases the risk? Certain factors may make you more likely to develop high blood pressure. Some of these risk factors are under your control, including: Smoking. Not getting enough exercise or physical activity. Being overweight. Having too much fat, sugar, calories, or salt (sodium) in your diet. Drinking too much alcohol. Other risk factors include: Having a personal history of heart disease, diabetes, high cholesterol, or kidney disease. Stress. Having a family history of high blood pressure and high cholesterol. Having obstructive sleep apnea. Age. The risk increases with age. What are the signs or symptoms? High blood pressure may not cause symptoms. Very high blood pressure (hypertensive crisis) may cause: Headache. Fast or irregular heartbeats (palpitations). Shortness of breath. Nosebleed. Nausea and vomiting. Vision changes. Severe chest pain, dizziness, and seizures. How is this diagnosed? This condition is diagnosed by  measuring your blood pressure while you are seated, with your arm resting on a flat surface, your legs uncrossed, and your feet flat on the floor. The cuff of the blood pressure monitor will be placed directly against the skin of your upper arm at the level of your heart. Blood pressure should be measured at least twice using the same arm. Certain conditions can cause a difference in blood pressure between your right and left arms. If you have a high blood pressure reading during one visit or you have normal blood pressure with other risk factors, you may be asked to: Return on a different day to have your blood pressure checked again. Monitor your blood pressure at home for 1 week or longer. If you are diagnosed with hypertension, you may have other blood or imaging tests to help your health care provider understand your overall risk for other conditions. How is this treated? This condition is treated by making healthy lifestyle changes, such as eating healthy foods, exercising more, and reducing your alcohol intake. You may be referred for counseling on a healthy diet and physical activity. Your health care provider may prescribe medicine if lifestyle changes are not enough to get your blood pressure under control and if: Your systolic blood pressure is above 130. Your diastolic blood pressure is above 80. Your personal target blood pressure may vary depending on your medical conditions, your age, and other factors. Follow these instructions at home: Eating and drinking  Eat a diet that is high in fiber and potassium, and low in sodium, added sugar, and fat. An example of this eating plan is called the DASH diet. DASH stands for Dietary Approaches to Stop Hypertension. To eat this way: Eat   plenty of fresh fruits and vegetables. Try to fill one half of your plate at each meal with fruits and vegetables. Eat whole grains, such as whole-wheat pasta, brown rice, or whole-grain bread. Fill about one  fourth of your plate with whole grains. Eat or drink low-fat dairy products, such as skim milk or low-fat yogurt. Avoid fatty cuts of meat, processed or cured meats, and poultry with skin. Fill about one fourth of your plate with lean proteins, such as fish, chicken without skin, beans, eggs, or tofu. Avoid pre-made and processed foods. These tend to be higher in sodium, added sugar, and fat. Reduce your daily sodium intake. Many people with hypertension should eat less than 1,500 mg of sodium a day. Do not drink alcohol if: Your health care provider tells you not to drink. You are pregnant, may be pregnant, or are planning to become pregnant. If you drink alcohol: Limit how much you have to: 0-1 drink a day for women. 0-2 drinks a day for men. Know how much alcohol is in your drink. In the [place] one drink equals one 12 oz bottle of beer (355 mL), one 5 oz glass of wine (148 mL), or one 1 oz glass of hard liquor (44 mL). Lifestyle  Work with your health care provider to maintain a healthy body weight or to lose weight. Ask what an ideal weight is for you. Get at least 30 minutes of exercise that causes your heart to beat faster (aerobic exercise) most days of the week. Activities may include walking, swimming, or biking. Include exercise to strengthen your muscles (resistance exercise), such as Pilates or lifting weights, as part of your weekly exercise routine. Try to do these types of exercises for 30 minutes at least 3 days a week. Do not use any products that contain nicotine or tobacco. These products include cigarettes, chewing tobacco, and vaping devices, such as e-cigarettes. If you need help quitting, ask your health care provider. Monitor your blood pressure at home as told by your health care provider. Keep all follow-up visits. This is important. Medicines Take over-the-counter and prescription medicines only as told by your health care provider. Follow directions carefully. Blood  pressure medicines must be taken as prescribed. Do not skip doses of blood pressure medicine. Doing this puts you at risk for problems and can make the medicine less effective. Ask your health care provider about side effects or reactions to medicines that you should watch for. Contact a health care provider if you: Think you are having a reaction to a medicine you are taking. Have headaches that keep coming back (recurring). Feel dizzy. Have swelling in your ankles. Have trouble with your vision. Get help right away if you: Develop a severe headache or confusion. Have unusual weakness or numbness. Feel faint. Have severe pain in your chest or abdomen. Vomit repeatedly. Have trouble breathing. These symptoms may be an emergency. Get help right away. Call 911. Do not wait to see if the symptoms will go away. Do not drive yourself to the hospital. Summary Hypertension is when the force of blood pumping through your arteries is too strong. If this condition is not controlled, it may put you at risk for serious complications. Your personal target blood pressure may vary depending on your medical conditions, your age, and other factors. For most people, a normal blood pressure is less than 120/80. Hypertension is treated with lifestyle changes, medicines, or a combination of both. Lifestyle changes include losing weight, eating a healthy,   low-sodium diet, exercising more, and limiting alcohol. This information is not intended to replace advice given to you by your health care provider. Make sure you discuss any questions you have with your health care provider. Document Revised: 01/18/2021 Document Reviewed: 01/18/2021 Elsevier Patient Education  2024 [employer]  

## 2023-02-28 NOTE — Assessment & Plan Note (Signed)
C-Met and lipid profile today Discussed the importance of blood pressure, blood sugar and cholesterol control Continue simvastatin, aspirin, amlodipine, labetalol and losartan

## 2023-02-28 NOTE — Assessment & Plan Note (Signed)
Elevated today but she has not taken her BP meds this morning Continue amlodipine, losartan and labetalol Reinforced DASH diet and weight loss CMET Today

## 2023-02-28 NOTE — Assessment & Plan Note (Signed)
Encouraged diet and exercise for weight loss ?

## 2023-02-28 NOTE — Assessment & Plan Note (Signed)
Continue losartan for renal protection C-Met today 

## 2023-03-01 ENCOUNTER — Telehealth: Payer: Self-pay | Admitting: *Deleted

## 2023-03-01 NOTE — Telephone Encounter (Signed)
Patient is calling for lab results:  WBC count is marginally low.  This is not concerning at this time but something we will monitor.  You have a slight anemia but it has improved from 1 year ago.  Liver and kidney function are normal.  Cholesterol looks great.   Patient states she is going to visit her family soon and she will be out of town until January- she will try to get more rest. Patient is unable to see her labs and would like a copy mailed to her.

## 2023-03-01 NOTE — Telephone Encounter (Signed)
Labs placed in mail send out for patient today.

## 2023-03-08 ENCOUNTER — Encounter: Payer: Self-pay | Admitting: Internal Medicine

## 2023-03-08 ENCOUNTER — Ambulatory Visit (INDEPENDENT_AMBULATORY_CARE_PROVIDER_SITE_OTHER): Payer: PPO | Admitting: Internal Medicine

## 2023-03-08 DIAGNOSIS — I10 Essential (primary) hypertension: Secondary | ICD-10-CM

## 2023-03-08 DIAGNOSIS — Z0001 Encounter for general adult medical examination with abnormal findings: Secondary | ICD-10-CM

## 2023-03-08 MED ORDER — LOSARTAN POTASSIUM 50 MG PO TABS: 50.0000 mg | ORAL_TABLET | Freq: Two times a day (BID) | ORAL | 0 refills | Status: DC

## 2023-03-08 MED ORDER — AMLODIPINE BESYLATE 5 MG PO TABS: 5.0000 mg | ORAL_TABLET | Freq: Two times a day (BID) | ORAL | 0 refills | Status: DC

## 2023-03-08 NOTE — Progress Notes (Signed)
Subjective:    Patient ID: Margaret Mcintosh, female    DOB: [birth year], 89 y.o.   MRN: [id number]  HPI  Patient presents to clinic today for 2-week follow-up of HTN..  HTN: Her BP today is 152/72.  She is taking amlodipine, labetalol and losartan as prescribed.  ECG from 10/2020 reviewed.    Review of Systems  Past Medical History:  Diagnosis Date   Arthritis    Cataract    Complication of anesthesia    "takes a long time to wake me up" (11/04/2015)   Diabetes mellitus (HCC)    GERD (gastroesophageal reflux disease)    Glaucoma    Hypertension    Stroke (HCC)    last 8/17. Mild left arm/leg weakness   TIA (transient ischemic attack) 11/04/2015    Current Outpatient Medications  Medication Sig Dispense Refill   amLODipine (NORVASC) 5 MG tablet Take 1 tablet by mouth once daily 90 tablet 1   aspirin 325 MG EC tablet Take 1 tablet by mouth daily.     Cholecalciferol (VITAMIN D3 PO) Take by mouth.     Elastic Bandages & Supports (MEDICAL COMPRESSION SOCKS) MISC 2 Devices by Does not apply route daily. 2 each 0   ferrous sulfate 324 MG TBEC Take 324 mg by mouth.     FREESTYLE LITE test strip 1 each by Other route daily. Use to test blood sugar up to 4 times daily. 90 each 4   labetalol (NORMODYNE) 100 MG tablet Take 1 tablet (100 mg total) by mouth 3 (three) times daily. 270 tablet 1   losartan (COZAAR) 50 MG tablet TAKE 1 TABLET BY MOUTH THREE TIMES DAILY WITH MEALS IN THE MORNING, AT NOON AND AT BEDTIME 270 tablet 1   metFORMIN (GLUCOPHAGE) 500 MG tablet TAKE 1 TABLET BY MOUTH TWICE DAILY WITH A MEAL 180 tablet 1   Multiple Vitamin (MULTIVITAMIN) tablet Take 1 tablet by mouth daily.     Multiple Vitamins-Minerals (GNP ONE DAILY MENS/LYCOPENE) TABS      Omega-3 Fatty Acids (FISH OIL) 1200 MG CAPS Take by mouth 3 (three) times daily.     pantoprazole (PROTONIX) 40 MG tablet TAKE 1 TABLET BY MOUTH ONCE DAILY AT  6AM 90 tablet 2   simvastatin (ZOCOR) 20 MG tablet Take 1 tablet  (20 mg total) by mouth at bedtime. 90 tablet 1   No current facility-administered medications for this visit.    Allergies  Allergen Reactions   Codeine Other (See Comments)    Gets nausea and feels like"something is crawling" "somethings crawling in my head"    Diphenhydramine Hcl Other (See Comments)    Feels like skin crawling "somethings crawling in my head"    Other Other (See Comments)    CANNOT HAVE ANY "SPICY" FOODS!!    Family History  Problem Relation Age of Onset   Hypertension Mother    Diabetes Mother    Heart disease Mother    Cancer Sister    Diabetes Brother    Hypertension Daughter    Other Son        Pituitary gland disease   Hypertension Son    Obesity Son    Colon cancer Brother    Diabetes Brother    Alzheimer's disease Brother    Diabetes Sister     Social History   Socioeconomic History   Marital status: Widowed    Spouse name: Not on file   Number of children: 2   Years of education: Not  on file   Highest education level: Not on file  Occupational History   Occupation: Retired  Tobacco Use   Smoking status: Never   Smokeless tobacco: Never  Vaping Use   Vaping status: Never Used  Substance and Sexual Activity   Alcohol use: No   Drug use: No   Sexual activity: Not Currently  Other Topics Concern   Not on file  Social History Narrative   Not on file   Social Drivers of Health   Financial Resource Strain: Medium Risk (02/28/2023)   Overall Financial Resource Strain (CARDIA)    Difficulty of Paying Living Expenses: Somewhat hard  Food Insecurity: Food Insecurity Present (02/28/2023)   Hunger Vital Sign    Worried About Running Out of Food in the Last Year: Never true    Ran Out of Food in the Last Year: Sometimes true  Transportation Needs: No Transportation Needs (02/28/2023)   PRAPARE - Administrator, Civil Service (Medical): No    Lack of Transportation (Non-Medical): No  Physical Activity: Insufficiently Active  (02/28/2023)   Exercise Vital Sign    Days of Exercise per Week: 1 day    Minutes of Exercise per Session: 20 min  Stress: No Stress Concern Present (02/28/2023)   [employer] of Occupational Health - Occupational Stress Questionnaire    Feeling of Stress : Only a little  Social Connections: Moderately Integrated (02/28/2023)   Social Connection and Isolation Panel [NHANES]    Frequency of Communication with Friends and Family: More than three times a week    Frequency of Social Gatherings with Friends and Family: More than three times a week    Attends Religious Services: More than 4 times per year    Active Member of [employer] or Organizations: Yes    Attends Banker Meetings: More than 4 times per year    Marital Status: Widowed  Intimate Partner Violence: Not At Risk (02/28/2023)   Humiliation, Afraid, Rape, and Kick questionnaire    Fear of Current or Ex-Partner: No    Emotionally Abused: No    Physically Abused: No    Sexually Abused: No     Constitutional: Denies fever, malaise, fatigue, headache or abrupt weight changes.  HEENT: Denies eye pain, eye redness, ear pain, ringing in the ears, wax buildup, runny nose, nasal congestion, bloody nose, or sore throat. Respiratory: Denies difficulty breathing, shortness of breath, cough or sputum production.   Cardiovascular: Denies chest pain, chest tightness, palpitations or swelling in the hands or feet.  Neurological: Denies dizziness, difficulty with memory, difficulty with speech or problems with balance and coordination.    No other specific complaints in a complete review of systems (except as listed in HPI above).     Objective:   Physical Exam  BP (!) 148/70   Ht 5\' 4"  (1.626 m)   Wt 171 lb 12.8 oz (77.9 kg)   BMI 29.49 kg/m     Wt Readings from Last 3 Encounters:  02/28/23 169 lb (76.7 kg)  08/29/22 174 lb (78.9 kg)  08/10/22 173 lb (78.5 kg)    General: Appears her stated age, overweight, in  NAD. Skin: Warm, dry and intact.  HEENT: Head: normal shape and size; Eyes: sclera white, no icterus, conjunctiva pink, PERRLA and EOMs intact;   Cardiovascular: Normal rate and rhythm. S1,S2 noted.  Murmur noted. No JVD. Trace nonpitting BLE edema. No carotid bruits noted. Pulmonary/Chest: Normal effort and positive vesicular breath sounds. No respiratory distress. No  wheezes, rales or ronchi noted.  Neurological: Alert and oriented. Coordination normal.     BMET    Component Value Date/Time   NA 141 02/28/2023 0915   NA 135 (L) 04/09/2013 1025   K 3.9 02/28/2023 0915   K 3.8 04/09/2013 1025   CL 105 02/28/2023 0915   CL 103 04/09/2013 1025   CO2 25 02/28/2023 0915   CO2 28 04/09/2013 1025   GLUCOSE 123 (H) 02/28/2023 0915   GLUCOSE 211 (H) 04/09/2013 1025   BUN 13 02/28/2023 0915   BUN 14 04/09/2013 1025   CREATININE 0.85 02/28/2023 0915   CALCIUM 9.7 02/28/2023 0915   CALCIUM 9.2 04/09/2013 1025   GFRNONAA 58 (L) 11/22/2020 0827   GFRNONAA 58 (L) 11/18/2019 1010   GFRAA 67 11/18/2019 1010    Lipid Panel     Component Value Date/Time   CHOL 147 02/28/2023 0915   TRIG 71 02/28/2023 0915   HDL 64 02/28/2023 0915   CHOLHDL 2.3 02/28/2023 0915   VLDL 23 11/05/2015 0549   LDLCALC 68 02/28/2023 0915    CBC    Component Value Date/Time   WBC 3.1 (L) 02/28/2023 0915   RBC 3.84 02/28/2023 0915   HGB 11.5 (L) 02/28/2023 0915   HGB 14.9 04/09/2013 1025   HCT 35.5 02/28/2023 0915   HCT 44.4 04/09/2013 1025   PLT 187 02/28/2023 0915   PLT 184 04/09/2013 1025   MCV 92.4 02/28/2023 0915   MCV 89 04/09/2013 1025   MCH 29.9 02/28/2023 0915   MCHC 32.4 02/28/2023 0915   RDW 12.7 02/28/2023 0915   RDW 13.5 04/09/2013 1025   LYMPHSABS 1.0 11/22/2020 0827   MONOABS 0.4 11/22/2020 0827   EOSABS 0.1 11/22/2020 0827   BASOSABS 0.0 11/22/2020 0827    Hgb A1C Lab Results  Component Value Date   HGBA1C 6.1 (A) 02/28/2023           Assessment & Plan:     RTC in  6 months for your annual exam Nicki Reaper, NP

## 2023-03-08 NOTE — Patient Instructions (Signed)
How to Take Your Blood Pressure Blood pressure measures how strongly your blood is pressing against the walls of your arteries. Arteries are blood vessels that carry blood from your heart throughout your body. You can take your blood pressure at home with a machine. You may need to check your blood pressure at home: To check if you have high blood pressure (hypertension). To check your blood pressure over time. To make sure your blood pressure medicine is working. Supplies needed: Blood pressure machine, or monitor. A chair to sit in. This should be a chair where you can sit upright with your back supported. Do not sit on a soft couch or an armchair. Table or desk. Small notebook. Pencil or pen. How to prepare Avoid these things for 30 minutes before checking your blood pressure: Having drinks with caffeine in them, such as coffee or tea. Drinking alcohol. Eating. Smoking. Exercising. Do these things five minutes before checking your blood pressure: Go to the bathroom and pee (urinate). Sit in a chair. Be quiet. Do not talk. How to take your blood pressure Follow the instructions that came with your machine. If you have a digital blood pressure monitor, these may be the instructions: Sit up straight. Place your feet on the floor. Do not cross your ankles or legs. Rest your left arm at the level of your heart. You may rest it on a table, desk, or chair. Pull up your shirt sleeve. Wrap the blood pressure cuff around the upper part of your left arm. The cuff should be 1 inch (2.5 cm) above your elbow. It is best to wrap the cuff around bare skin. Fit the cuff snugly around your arm, but not too tightly. You should be able to place only one finger between the cuff and your arm. Place the cord so that it rests in the bend of your elbow. Press the power button. Sit quietly while the cuff fills with air and loses air. Write down the numbers on the screen. Wait 2-3 minutes and then repeat  steps 1-10. What do the numbers mean? Two numbers make up your blood pressure. The first number is called systolic pressure. The second is called diastolic pressure. An example of a blood pressure reading is "120 over 80" (or 120/80). If you are an adult and do not have a medical condition, use this guide to find out if your blood pressure is normal: Normal First number: below 120. Second number: below 80. Elevated First number: 120-129. Second number: below 80. Hypertension stage 1 First number: 130-139. Second number: 80-89. Hypertension stage 2 First number: 140 or above. Second number: 90 or above. Your blood pressure is above normal even if only the first or only the second number is above normal. Follow these instructions at home: Medicines Take over-the-counter and prescription medicines only as told by your doctor. Tell your doctor if your medicine is causing side effects. General instructions Check your blood pressure as often as your doctor tells you to. Check your blood pressure at the same time every day. Take your monitor to your next doctor's appointment. Your doctor will: Make sure you are using it correctly. Make sure it is working right. Understand what your blood pressure numbers should be. Keep all follow-up visits. General tips You will need a blood pressure machine or monitor. Your doctor can suggest a monitor. You can buy one at a drugstore or online. When choosing one: Choose one with an arm cuff. Choose one that wraps around your   upper arm. Only one finger should fit between your arm and the cuff. Do not choose one that measures your blood pressure from your wrist or finger. Where to find more information American Heart Association: www.heart.org Contact a doctor if: Your blood pressure keeps being high. Your blood pressure is suddenly low. Get help right away if: Your first blood pressure number is higher than 180. Your second blood pressure number is  higher than 120. These symptoms may be an emergency. Do not wait to see if the symptoms will go away. Get help right away. Call 911. Summary Check your blood pressure at the same time every day. Avoid caffeine, alcohol, smoking, and exercise for 30 minutes before checking your blood pressure. Make sure you understand what your blood pressure numbers should be. This information is not intended to replace advice given to you by your health care provider. Make sure you discuss any questions you have with your health care provider. Document Revised: 11/25/2020 Document Reviewed: 11/25/2020 Elsevier Patient Education  2024 Elsevier Inc.  

## 2023-03-08 NOTE — Assessment & Plan Note (Signed)
Will change losartan to 50 mg twice daily instead of 3 times daily as 100 g is the max dose for this medication Will increase amlodipine to 5 mg twice daily Continue labetalol 100 mg 3 times daily Reinforced DASH diet Will monitor

## 2023-04-01 ENCOUNTER — Other Ambulatory Visit: Payer: Self-pay | Admitting: Internal Medicine

## 2023-04-01 DIAGNOSIS — Z0001 Encounter for general adult medical examination with abnormal findings: Secondary | ICD-10-CM

## 2023-04-03 NOTE — Telephone Encounter (Signed)
 Requested too soon last ordered 03/08/23, #180, 0RF Requested Prescriptions  Pending Prescriptions Disp Refills   losartan  (COZAAR ) 50 MG tablet [Pharmacy Med Name: Losartan  Potassium 50 MG Oral Tablet] 270 tablet 0    Sig: TAKE 1 TABLET BY MOUTH THREE TIMES DAILY WITH MEALS IN THE MORNING, AT NOON,  AND AT BEDTIME     Cardiovascular:  Angiotensin Receptor Blockers Failed - 04/03/2023  4:10 PM      Failed - Last BP in normal range    BP Readings from Last 1 Encounters:  03/08/23 (!) 148/70         Passed - Cr in normal range and within 180 days    Creat  Date Value Ref Range Status  02/28/2023 0.85 0.60 - 0.95 mg/dL Final   Creatinine, Urine  Date Value Ref Range Status  08/29/2022 34 20 - 275 mg/dL Final         Passed - K in normal range and within 180 days    Potassium  Date Value Ref Range Status  02/28/2023 3.9 3.5 - 5.3 mmol/L Final  04/09/2013 3.8 3.5 - 5.1 mmol/L Final         Passed - Patient is not pregnant      Passed - Valid encounter within last 6 months    Recent Outpatient Visits           3 weeks ago Encounter for general adult medical examination with abnormal findings   [place] [facility] [place], Angeline ORN, NP   1 month ago Type 2 diabetes mellitus with hyperglycemia, without long-term current use of insulin  [facility])   [facility] [place], Angeline ORN, NP   7 months ago Encounter for general adult medical examination with abnormal findings   [facility] [facility] [place], [place] W, NP   1 year ago Type 2 diabetes mellitus with hyperglycemia, without long-term current use of insulin  [facility])   [place] [facility] [place], Angeline ORN, NP   1 year ago Encounter for general adult medical examination with abnormal findings   [place] [facility] [place], Angeline ORN, NP       Future Appointments             In 5 months Baity, Angeline ORN, NP [place], [facility]

## 2023-04-06 ENCOUNTER — Other Ambulatory Visit: Payer: Self-pay | Admitting: Internal Medicine

## 2023-04-06 DIAGNOSIS — Z0001 Encounter for general adult medical examination with abnormal findings: Secondary | ICD-10-CM

## 2023-04-09 ENCOUNTER — Other Ambulatory Visit: Payer: Self-pay | Admitting: Internal Medicine

## 2023-04-09 DIAGNOSIS — E119 Type 2 diabetes mellitus without complications: Secondary | ICD-10-CM

## 2023-04-10 NOTE — Telephone Encounter (Signed)
 Requested Prescriptions  Pending Prescriptions Disp Refills   metFORMIN  (GLUCOPHAGE ) 500 MG tablet [Pharmacy Med Name: metFORMIN  HCl 500 MG Oral Tablet] 180 tablet 1    Sig: TAKE 1 TABLET BY MOUTH TWICE DAILY WITH A MEAL     Endocrinology:  Diabetes - Biguanides Failed - 04/10/2023  3:00 PM      Failed - B12 Level in normal range and within 720 days    No results found for: VITAMINB12       Failed - CBC within normal limits and completed in the last 12 months    WBC  Date Value Ref Range Status  02/28/2023 3.1 (L) 3.8 - 10.8 Thousand/uL Final   RBC  Date Value Ref Range Status  02/28/2023 3.84 3.80 - 5.10 Million/uL Final   Hemoglobin  Date Value Ref Range Status  02/28/2023 11.5 (L) 11.7 - 15.5 g/dL Final   HGB  Date Value Ref Range Status  04/09/2013 14.9 12.0 - 16.0 g/dL Final   HCT  Date Value Ref Range Status  02/28/2023 35.5 35.0 - 45.0 % Final  04/09/2013 44.4 35.0 - 47.0 % Final   MCHC  Date Value Ref Range Status  02/28/2023 32.4 32.0 - 36.0 g/dL Final    Comment:    For adults, a slight decrease in the calculated MCHC value (in the range of 30 to 32 g/dL) is most likely not clinically significant; however, it should be interpreted with caution in correlation with other red cell parameters and the patient's clinical condition.    [facility]  Date Value Ref Range Status  02/28/2023 29.9 27.0 - 33.0 pg Final   MCV  Date Value Ref Range Status  02/28/2023 92.4 80.0 - 100.0 fL Final  04/09/2013 89 80 - 100 fL Final   No results found for: PLTCOUNTKUC, LABPLAT, POCPLA RDW  Date Value Ref Range Status  02/28/2023 12.7 11.0 - 15.0 % Final  04/09/2013 13.5 11.5 - 14.5 % Final         Passed - Cr in normal range and within 360 days    Creat  Date Value Ref Range Status  02/28/2023 0.85 0.60 - 0.95 mg/dL Final   Creatinine, Urine  Date Value Ref Range Status  08/29/2022 34 20 - 275 mg/dL Final         Passed - HBA1C is between 0 and 7.9 and within  180 days    Hemoglobin A1C  Date Value Ref Range Status  02/28/2023 6.1 (A) 4.0 - 5.6 % Final   Hgb A1c MFr Bld  Date Value Ref Range Status  08/29/2022 6.2 (H) <5.7 % of total Hgb Final    Comment:    For someone without known diabetes, a hemoglobin  A1c value between 5.7% and 6.4% is consistent with prediabetes and should be confirmed with a  follow-up test. . For someone with known diabetes, a value <7% indicates that their diabetes is well controlled. A1c targets should be individualized based on duration of diabetes, age, comorbid conditions, and other considerations. . This assay result is consistent with an increased risk of diabetes. . Currently, no consensus exists regarding use of hemoglobin A1c for diagnosis of diabetes for children. .          Passed - eGFR in normal range and within 360 days    GFR, Est African American  Date Value Ref Range Status  11/18/2019 67 > OR = 60 mL/min/1.22m2 Final   GFR, Est Non African American  Date Value Ref  Range Status  11/18/2019 58 (L) > OR = 60 mL/min/1.23m2 Final   GFR, Estimated  Date Value Ref Range Status  11/22/2020 58 (L) >60 mL/min Final    Comment:    (NOTE) Calculated using the CKD-EPI Creatinine Equation (2021)    eGFR  Date Value Ref Range Status  02/28/2023 66 > OR = 60 mL/min/1.27m2 Final         Passed - Valid encounter within last 6 months    Recent Outpatient Visits           1 month ago Encounter for general adult medical examination with abnormal findings   [place] [facility] [place], [place] W, NP   1 month ago Type 2 diabetes mellitus with hyperglycemia, without long-term current use of insulin  [facility])   [facility] [place], Angeline ORN, NP   7 months ago Encounter for general adult medical examination with abnormal findings   [place] [facility] [place], [place] W, NP   1 year ago Type 2 diabetes mellitus with hyperglycemia,  without long-term current use of insulin  [facility])   [place] [facility] [place], Angeline ORN, NP   1 year ago Encounter for general adult medical examination with abnormal findings   [place] [facility] [place], Angeline ORN, NP       Future Appointments             In 4 months Baity, Angeline ORN, NP [facility], [facility]

## 2023-04-15 ENCOUNTER — Other Ambulatory Visit: Payer: Self-pay | Admitting: Internal Medicine

## 2023-04-15 DIAGNOSIS — I10 Essential (primary) hypertension: Secondary | ICD-10-CM

## 2023-04-16 NOTE — Telephone Encounter (Signed)
Requested Prescriptions  Pending Prescriptions Disp Refills   amLODipine (NORVASC) 5 MG tablet [Pharmacy Med Name: amLODIPine Besylate 5 MG Oral Tablet] 90 tablet 0    Sig: Take 1 tablet by mouth once daily     Cardiovascular: Calcium Channel Blockers 2 Failed - 04/16/2023  1:22 PM      Failed - Last BP in normal range    BP Readings from Last 1 Encounters:  03/08/23 (!) 148/70         Passed - Last Heart Rate in normal range    Pulse Readings from Last 1 Encounters:  08/29/22 63         Passed - Valid encounter within last 6 months    Recent Outpatient Visits           1 month ago Encounter for general adult medical examination with abnormal findings   [facility] [place], [place] W, NP   1 month ago Type 2 diabetes mellitus with hyperglycemia, without long-term current use of insulin [facility])   [place] [facility] [place], Salvadore [place], NP   7 months ago Encounter for general adult medical examination with abnormal findings   [facility] [place], [place] W, NP   1 year ago Type 2 diabetes mellitus with hyperglycemia, without long-term current use of insulin [facility])   [place] [facility] [place], Salvadore Oxford, NP   1 year ago Encounter for general adult medical examination with abnormal findings   [place] [place], Salvadore Oxford, NP       Future Appointments             In 4 months Baity, Salvadore Oxford, NP [place] [facility], [facility]

## 2023-05-21 ENCOUNTER — Other Ambulatory Visit: Payer: Self-pay | Admitting: Internal Medicine

## 2023-05-21 DIAGNOSIS — K219 Gastro-esophageal reflux disease without esophagitis: Secondary | ICD-10-CM

## 2023-05-22 NOTE — Telephone Encounter (Signed)
 Requested Prescriptions  Pending Prescriptions Disp Refills   pantoprazole (PROTONIX) 40 MG tablet [Pharmacy Med Name: Pantoprazole Sodium 40 MG Oral Tablet Delayed Release] 90 tablet 2    Sig: TAKE 1 TABLET BY MOUTH ONCE DAILY AT 6 AM     Gastroenterology: Proton Pump Inhibitors Passed - 05/22/2023  2:24 PM      Passed - Valid encounter within last 12 months    Recent Outpatient Visits           2 months ago Encounter for general adult medical examination with abnormal findings   [place] [facility] [place], [place] W, NP   2 months ago Type 2 diabetes mellitus with hyperglycemia, without long-term current use of insulin [facility])   [place] [facility] [place], Salvadore Oxford, NP   8 months ago Encounter for general adult medical examination with abnormal findings   [facility] [place], [place] W, NP   1 year ago Type 2 diabetes mellitus with hyperglycemia, without long-term current use of insulin [facility])   [place] [facility] [place], Salvadore Oxford, NP   1 year ago Encounter for general adult medical examination with abnormal findings   [facility] [place], Salvadore Oxford, NP       Future Appointments             In 3 months Baity, Salvadore Oxford, NP [place] [facility], [facility]

## 2023-06-04 ENCOUNTER — Other Ambulatory Visit: Payer: Self-pay | Admitting: Internal Medicine

## 2023-06-04 DIAGNOSIS — I10 Essential (primary) hypertension: Secondary | ICD-10-CM

## 2023-06-05 NOTE — Telephone Encounter (Signed)
 Requested Prescriptions  Refused Prescriptions Disp Refills   amLODipine (NORVASC) 5 MG tablet [Pharmacy Med Name: amLODIPine Besylate 5 MG Oral Tablet] 90 tablet 0    Sig: Take 1 tablet by mouth once daily     Cardiovascular: Calcium Channel Blockers 2 Failed - 06/05/2023  3:18 PM      Failed - Last BP in normal range    BP Readings from Last 1 Encounters:  03/08/23 (!) 148/70         Passed - Last Heart Rate in normal range    Pulse Readings from Last 1 Encounters:  08/29/22 63         Passed - Valid encounter within last 6 months    Recent Outpatient Visits           2 months ago Encounter for general adult medical examination with abnormal findings   [place] [facility] [place], [place] W, NP   3 months ago Type 2 diabetes mellitus with hyperglycemia, without long-term current use of insulin [facility])   [facility] [place], Salvadore Oxford, NP   9 months ago Encounter for general adult medical examination with abnormal findings   [place] [facility] [place], [place] W, NP   1 year ago Type 2 diabetes mellitus with hyperglycemia, without long-term current use of insulin [facility])   [place] [facility] [place], Salvadore Oxford, NP   1 year ago Encounter for general adult medical examination with abnormal findings   [place] [facility] [address], Salvadore Oxford, NP       Future Appointments             In 3 months Baity, Salvadore Oxford, NP [place] [facility], [facility]

## 2023-06-07 DIAGNOSIS — H353132 Nonexudative age-related macular degeneration, bilateral, intermediate dry stage: Secondary | ICD-10-CM | POA: Diagnosis not present

## 2023-06-07 DIAGNOSIS — E119 Type 2 diabetes mellitus without complications: Secondary | ICD-10-CM | POA: Diagnosis not present

## 2023-06-11 ENCOUNTER — Other Ambulatory Visit: Payer: Self-pay | Admitting: Internal Medicine

## 2023-06-11 DIAGNOSIS — I10 Essential (primary) hypertension: Secondary | ICD-10-CM

## 2023-06-12 NOTE — Telephone Encounter (Signed)
 Requested Prescriptions  Refused Prescriptions Disp Refills   amLODipine (NORVASC) 5 MG tablet [Pharmacy Med Name: amLODIPine Besylate 5 MG Oral Tablet] 90 tablet 0    Sig: Take 1 tablet by mouth once daily     Cardiovascular: Calcium Channel Blockers 2 Failed - 06/12/2023  4:46 PM      Failed - Last BP in normal range    BP Readings from Last 1 Encounters:  03/08/23 (!) 148/70         Passed - Last Heart Rate in normal range    Pulse Readings from Last 1 Encounters:  08/29/22 63         Passed - Valid encounter within last 6 months    Recent Outpatient Visits           3 months ago Encounter for general adult medical examination with abnormal findings   [facility] [place], [place] W, NP   3 months ago Type 2 diabetes mellitus with hyperglycemia, without long-term current use of insulin [facility])   [place] [facility] [place], Salvadore Oxford, NP   9 months ago Encounter for general adult medical examination with abnormal findings   [place] [facility] [place], [place] W, NP   1 year ago Type 2 diabetes mellitus with hyperglycemia, without long-term current use of insulin [facility])   [place] [facility] [place], Salvadore Oxford, NP   1 year ago Encounter for general adult medical examination with abnormal findings   [place] [facility] [place], Salvadore Oxford, NP       Future Appointments             In 2 months [place], Salvadore Oxford, NP [place], [facility]

## 2023-06-13 ENCOUNTER — Other Ambulatory Visit: Payer: Self-pay | Admitting: Internal Medicine

## 2023-06-13 DIAGNOSIS — I10 Essential (primary) hypertension: Secondary | ICD-10-CM

## 2023-06-14 ENCOUNTER — Other Ambulatory Visit: Payer: Self-pay | Admitting: Internal Medicine

## 2023-06-14 DIAGNOSIS — I10 Essential (primary) hypertension: Secondary | ICD-10-CM

## 2023-06-14 NOTE — Telephone Encounter (Signed)
 Medication order changed at visit 03/08/23. Encounter note states to take Amlodipine 5mg  BID instead. Patient requesting refill due to increase in medication since December visit.   Copied from CRM [id number]. Topic: Clinical - Prescription Issue >> Jun 14, 2023 11:30 AM Shon Hale wrote: Reason for CRM: Patient requesting clarification for amlodipine rx. Patient states she was told by provider to start taking it twice daily on 03/08/2023.   Patient has requested refills via pharmacy that are being denied due to it being requested to soon.   Patient requesting callback, [phone]

## 2023-06-14 NOTE — Telephone Encounter (Signed)
 Attempted to contact pharmacy to verify last refill dispensed. Rang multiple times with no answer from pharmacy staff.

## 2023-06-14 NOTE — Telephone Encounter (Signed)
 Requested by interface surescripts. Last refill doc. 04/18/23 . Requested too soon.  Requested Prescriptions  Refused Prescriptions Disp Refills   amLODipine (NORVASC) 5 MG tablet [Pharmacy Med Name: amLODIPine Besylate 5 MG Oral Tablet] 90 tablet 0    Sig: Take 1 tablet by mouth once daily     Cardiovascular: Calcium Channel Blockers 2 Failed - 06/14/2023  4:12 PM      Failed - Last BP in normal range    BP Readings from Last 1 Encounters:  03/08/23 (!) 148/70         Passed - Last Heart Rate in normal range    Pulse Readings from Last 1 Encounters:  08/29/22 63         Passed - Valid encounter within last 6 months    Recent Outpatient Visits           3 months ago Encounter for general adult medical examination with abnormal findings   [place] [facility] [place], [place] W, NP   3 months ago Type 2 diabetes mellitus with hyperglycemia, without long-term current use of insulin [facility])   [place] [facility] [place], Salvadore Oxford, NP   9 months ago Encounter for general adult medical examination with abnormal findings   [place] [facility] [place], [place] W, NP   1 year ago Type 2 diabetes mellitus with hyperglycemia, without long-term current use of insulin [facility])   [place] [facility] [place], Salvadore Oxford, NP   1 year ago Encounter for general adult medical examination with abnormal findings    [facility] [place], Salvadore Oxford, NP

## 2023-06-15 ENCOUNTER — Other Ambulatory Visit: Payer: Self-pay

## 2023-06-15 DIAGNOSIS — I10 Essential (primary) hypertension: Secondary | ICD-10-CM

## 2023-06-15 MED ORDER — AMLODIPINE BESYLATE 5 MG PO TABS: 5.0000 mg | ORAL_TABLET | Freq: Two times a day (BID) | ORAL | 0 refills | Status: DC

## 2023-06-15 NOTE — Telephone Encounter (Signed)
 Requested medication (s) are due for refill today: Yes  Requested medication (s) are on the active medication list: No (sig not correct on med list)  Last refill:  04/16/23  Future visit scheduled: No  Notes to clinic:  Medication order changed at visit 03/08/23. Encounter note states to take Amlodipine 5mg  BID instead. Patient requesting refill due to increase in medication since December visit. Please update dose on medication list and refill.      Requested Prescriptions  Pending Prescriptions Disp Refills   amLODipine (NORVASC) 5 MG tablet 90 tablet 0    Sig: Take 1 tablet (5 mg total) by mouth daily.     Cardiovascular: Calcium Channel Blockers 2 Failed - 06/15/2023 12:01 PM      Failed - Last BP in normal range    BP Readings from Last 1 Encounters:  03/08/23 (!) 148/70         Passed - Last Heart Rate in normal range    Pulse Readings from Last 1 Encounters:  08/29/22 63         Passed - Valid encounter within last 6 months    Recent Outpatient Visits           3 months ago Encounter for general adult medical examination with abnormal findings   [place] [facility] [place], [place] W, NP   3 months ago Type 2 diabetes mellitus with hyperglycemia, without long-term current use of insulin [facility])   [facility] [place], Salvadore Oxford, NP   9 months ago Encounter for general adult medical examination with abnormal findings   [place] [facility] [place], [place] W, NP   1 year ago Type 2 diabetes mellitus with hyperglycemia, without long-term current use of insulin [facility])   [place] [facility] [place], Salvadore Oxford, NP   1 year ago Encounter for general adult medical examination with abnormal findings   [place] [facility] [place], Salvadore Oxford, NP

## 2023-08-14 ENCOUNTER — Other Ambulatory Visit: Payer: Self-pay | Admitting: Internal Medicine

## 2023-08-14 DIAGNOSIS — Z0001 Encounter for general adult medical examination with abnormal findings: Secondary | ICD-10-CM

## 2023-08-16 ENCOUNTER — Telehealth: Payer: Self-pay

## 2023-08-16 NOTE — Telephone Encounter (Signed)
 Contacted patient on preferred number listed in notes for scheduled AWV. Patient unable to complete visit today will call back to reschedule.

## 2023-08-16 NOTE — Telephone Encounter (Signed)
 Requested Prescriptions  Pending Prescriptions Disp Refills   losartan  (COZAAR ) 50 MG tablet [Pharmacy Med Name: Losartan  Potassium 50 MG Oral Tablet] 270 tablet 0    Sig: TAKE 1 TABLET BY MOUTH THREE TIMES DAILY WITH MEALS (IN THE MORNING, AT NOON, AND AT BEDTIME)     Cardiovascular:  Angiotensin Receptor Blockers Failed - 08/16/2023  7:46 AM      Failed - Last BP in normal range    BP Readings from Last 1 Encounters:  03/08/23 (!) 148/70         Failed - Valid encounter within last 6 months    Recent Outpatient Visits   None            Passed - Cr in normal range and within 180 days    Creat  Date Value Ref Range Status  02/28/2023 0.85 0.60 - 0.95 mg/dL Final   Creatinine, Urine  Date Value Ref Range Status  08/29/2022 34 20 - 275 mg/dL Final         Passed - K in normal range and within 180 days    Potassium  Date Value Ref Range Status  02/28/2023 3.9 3.5 - 5.3 mmol/L Final  04/09/2013 3.8 3.5 - 5.1 mmol/L Final         Passed - Patient is not pregnant

## 2023-09-06 ENCOUNTER — Ambulatory Visit: Payer: Self-pay | Admitting: Internal Medicine

## 2023-09-08 ENCOUNTER — Other Ambulatory Visit: Payer: Self-pay | Admitting: Internal Medicine

## 2023-09-08 DIAGNOSIS — I10 Essential (primary) hypertension: Secondary | ICD-10-CM

## 2023-09-11 NOTE — Telephone Encounter (Signed)
 Requested Prescriptions  Pending Prescriptions Disp Refills   labetalol  (NORMODYNE ) 100 MG tablet [Pharmacy Med Name: Labetalol  HCl 100 MG Oral Tablet] 270 tablet 0    Sig: TAKE 1 TABLET BY MOUTH THREE TIMES DAILY     Cardiovascular:  Beta Blockers Failed - 09/11/2023 12:16 PM      Failed - Last BP in normal range    BP Readings from Last 1 Encounters:  03/08/23 (!) 148/70         Failed - Valid encounter within last 6 months    Recent Outpatient Visits   None            Passed - Last Heart Rate in normal range    Pulse Readings from Last 1 Encounters:  08/29/22 63

## 2023-09-12 ENCOUNTER — Other Ambulatory Visit: Payer: Self-pay | Admitting: Internal Medicine

## 2023-09-12 DIAGNOSIS — I10 Essential (primary) hypertension: Secondary | ICD-10-CM

## 2023-09-14 NOTE — Telephone Encounter (Signed)
 Requested Prescriptions  Pending Prescriptions Disp Refills   amLODipine  (NORVASC ) 5 MG tablet [Pharmacy Med Name: amLODIPine  Besylate 5 MG Oral Tablet] 180 tablet 0    Sig: Take 1 tablet by mouth twice daily     Cardiovascular: Calcium Channel Blockers 2 Failed - 09/14/2023 12:06 PM      Failed - Last BP in normal range    BP Readings from Last 1 Encounters:  03/08/23 (!) 148/70         Failed - Valid encounter within last 6 months    Recent Outpatient Visits   None            Passed - Last Heart Rate in normal range    Pulse Readings from Last 1 Encounters:  08/29/22 63

## 2023-09-24 ENCOUNTER — Other Ambulatory Visit: Payer: Self-pay | Admitting: Internal Medicine

## 2023-09-24 DIAGNOSIS — E782 Mixed hyperlipidemia: Secondary | ICD-10-CM

## 2023-09-25 ENCOUNTER — Encounter: Payer: Self-pay | Admitting: Internal Medicine

## 2023-09-25 ENCOUNTER — Ambulatory Visit (INDEPENDENT_AMBULATORY_CARE_PROVIDER_SITE_OTHER): Admitting: Internal Medicine

## 2023-09-25 ENCOUNTER — Other Ambulatory Visit: Payer: Self-pay | Admitting: Internal Medicine

## 2023-09-25 VITALS — BP 130/68 | Ht 64.0 in | Wt 167.2 lb

## 2023-09-25 DIAGNOSIS — E782 Mixed hyperlipidemia: Secondary | ICD-10-CM | POA: Diagnosis not present

## 2023-09-25 DIAGNOSIS — E1165 Type 2 diabetes mellitus with hyperglycemia: Secondary | ICD-10-CM | POA: Diagnosis not present

## 2023-09-25 DIAGNOSIS — N1831 Chronic kidney disease, stage 3a: Secondary | ICD-10-CM

## 2023-09-25 DIAGNOSIS — K219 Gastro-esophageal reflux disease without esophagitis: Secondary | ICD-10-CM

## 2023-09-25 DIAGNOSIS — E1169 Type 2 diabetes mellitus with other specified complication: Secondary | ICD-10-CM

## 2023-09-25 DIAGNOSIS — E785 Hyperlipidemia, unspecified: Secondary | ICD-10-CM

## 2023-09-25 DIAGNOSIS — E663 Overweight: Secondary | ICD-10-CM

## 2023-09-25 DIAGNOSIS — D508 Other iron deficiency anemias: Secondary | ICD-10-CM | POA: Diagnosis not present

## 2023-09-25 DIAGNOSIS — Z6828 Body mass index (BMI) 28.0-28.9, adult: Secondary | ICD-10-CM | POA: Diagnosis not present

## 2023-09-25 DIAGNOSIS — I10 Essential (primary) hypertension: Secondary | ICD-10-CM | POA: Diagnosis not present

## 2023-09-25 DIAGNOSIS — Z7984 Long term (current) use of oral hypoglycemic drugs: Secondary | ICD-10-CM

## 2023-09-25 DIAGNOSIS — I6930 Unspecified sequelae of cerebral infarction: Secondary | ICD-10-CM | POA: Diagnosis not present

## 2023-09-25 DIAGNOSIS — D509 Iron deficiency anemia, unspecified: Secondary | ICD-10-CM | POA: Insufficient documentation

## 2023-09-25 MED ORDER — LOSARTAN POTASSIUM 50 MG PO TABS: 50.0000 mg | ORAL_TABLET | Freq: Two times a day (BID) | ORAL | 1 refills | Status: DC

## 2023-09-25 MED ORDER — SIMVASTATIN 20 MG PO TABS: 20.0000 mg | ORAL_TABLET | Freq: Every day | ORAL | 1 refills | Status: AC

## 2023-09-25 MED ORDER — PANTOPRAZOLE SODIUM 40 MG PO TBEC: 40.0000 mg | DELAYED_RELEASE_TABLET | Freq: Two times a day (BID) | ORAL | 1 refills | Status: DC

## 2023-09-25 NOTE — Assessment & Plan Note (Signed)
 C-Met and lipid profile today Discussed the importance of blood pressure, blood sugar and cholesterol control Continue simvastatin  20 mg daily, aspirin  325 mg daily, amlodipine  5 mg twice daily, labetalol  100 mg twice daily and losartan  50 mg twice daily

## 2023-09-25 NOTE — Assessment & Plan Note (Signed)
 Deteriorated possibly due to hiatal hernia Try to avoid foods that trigger reflux Encourage weight also as this can help reduce reflux symptoms Will increase pantoprazole  to 40 mg twice daily Consider referral to GI for upper endoscopy

## 2023-09-25 NOTE — Assessment & Plan Note (Signed)
 CBC today Continue oral iron 325 mg daily

## 2023-09-25 NOTE — Progress Notes (Signed)
 Subjective:    Patient ID: Margaret Mcintosh, female    DOB: 11/27/1932, 89 y.o.   MRN: [id number]  HPI  Patient presents to clinic today for follow-up of chronic conditions.  HTN: Her BP today is 130/68.  She is taking amlodipine , labetalol  and losartan  as prescribed.  ECG from 10/2020 reviewed.  HLD with history of stroke: Her last LDL was 68, triglycerides 71, 02/2023.  She denies myalgias on simvastatin  and fish oil.  She is taking aspirin  as well.  She tries to consume a low-fat diet.  GERD: Triggered by acidic foods.  She denies breakthrough on pantoprazole . She has had a sensation of a lump in her throat and frequent vomiting.  She reports history of hiatal hernia.  She also sleeps with a pillow wedge that has seemed to help.  There is no upper GI on file.  DM2: Her last A1c was 6.1%, 02/2023.  She is taking metformin  as prescribed.  She does not check her sugars.  She checks her feet routinely.  Her last eye exam was 11/2022.  Flu 02/2023.  Pneumovax 03/2013.  Prevnar 20 02/2023.  COVID Moderna x2.  CKD: Her last creatinine was 0.85, GFR 66, 02/2023.  She is on losartan  for renal protection.  She does not follow with nephrology.  Iron deficiency anemia: Her last H/H was 11.5/35.5, 02/2023.  She is taking oral iron as prescribed.  She does not follow with hematology.  Review of Systems  Past Medical History:  Diagnosis Date   Arthritis    Cataract    Complication of anesthesia    takes a long time to wake me up (11/04/2015)   Diabetes mellitus (HCC)    GERD (gastroesophageal reflux disease)    Glaucoma    Hypertension    Stroke (HCC)    last 8/17. Mild left arm/leg weakness   TIA (transient ischemic attack) 11/04/2015    Current Outpatient Medications  Medication Sig Dispense Refill   amLODipine  (NORVASC ) 5 MG tablet Take 1 tablet by mouth twice daily 180 tablet 0   aspirin  325 MG EC tablet Take 1 tablet by mouth daily.     Cholecalciferol (VITAMIN D3 PO) Take by mouth.      Elastic Bandages & Supports (MEDICAL COMPRESSION SOCKS) MISC 2 Devices by Does not apply route daily. 2 each 0   ferrous sulfate 324 MG TBEC Take 324 mg by mouth.     FREESTYLE LITE test strip 1 each by Other route daily. Use to test blood sugar up to 4 times daily. 90 each 4   ibuprofen  (ADVIL ) 800 MG tablet Take 800 mg by mouth every 8 (eight) hours as needed.     labetalol  (NORMODYNE ) 100 MG tablet TAKE 1 TABLET BY MOUTH THREE TIMES DAILY 270 tablet 0   losartan  (COZAAR ) 50 MG tablet TAKE 1 TABLET BY MOUTH THREE TIMES DAILY WITH MEALS (IN THE MORNING, AT NOON, AND AT BEDTIME) 270 tablet 0   metFORMIN  (GLUCOPHAGE ) 500 MG tablet TAKE 1 TABLET BY MOUTH TWICE DAILY WITH A MEAL 180 tablet 1   Multiple Vitamin (MULTIVITAMIN) tablet Take 1 tablet by mouth daily.     Multiple Vitamins-Minerals (GNP ONE DAILY MENS/LYCOPENE) TABS      Omega-3 Fatty Acids (FISH OIL) 1200 MG CAPS Take by mouth 3 (three) times daily.     pantoprazole  (PROTONIX ) 40 MG tablet TAKE 1 TABLET BY MOUTH ONCE DAILY AT 6 AM 90 tablet 2   simvastatin  (ZOCOR ) 20 MG tablet Take 1  tablet (20 mg total) by mouth at bedtime. 90 tablet 1   No current facility-administered medications for this visit.    Allergies  Allergen Reactions   Codeine Other (See Comments)    Gets nausea and feels likesomething is crawling somethings crawling in my head    Diphenhydramine Hcl Other (See Comments)    Feels like skin crawling somethings crawling in my head    Other Other (See Comments)    CANNOT HAVE ANY SPICY FOODS!!    Family History  Problem Relation Age of Onset   Hypertension Mother    Diabetes Mother    Heart disease Mother    Cancer Sister    Diabetes Brother    Hypertension Daughter    Other Son        Pituitary gland disease   Hypertension Son    Obesity Son    Colon cancer Brother    Diabetes Brother    Alzheimer's disease Brother    Diabetes Sister     Social History   Socioeconomic History   Marital  status: Widowed    Spouse name: Not on file   Number of children: 2   Years of education: Not on file   Highest education level: Not on file  Occupational History   Occupation: Retired  Tobacco Use   Smoking status: Never   Smokeless tobacco: Never  Vaping Use   Vaping status: Never Used  Substance and Sexual Activity   Alcohol use: No   Drug use: No   Sexual activity: Not Currently  Other Topics Concern   Not on file  Social History Narrative   Not on file   Social Drivers of Health   Financial Resource Strain: Medium Risk (02/28/2023)   Overall Financial Resource Strain (CARDIA)    Difficulty of Paying Living Expenses: Somewhat hard  Food Insecurity: Food Insecurity Present (02/28/2023)   Hunger Vital Sign    Worried About Running Out of Food in the Last Year: Never true    Ran Out of Food in the Last Year: Sometimes true  Transportation Needs: No Transportation Needs (02/28/2023)   PRAPARE - Administrator, Civil Service (Medical): No    Lack of Transportation (Non-Medical): No  Physical Activity: Insufficiently Active (02/28/2023)   Exercise Vital Sign    Days of Exercise per Week: 1 day    Minutes of Exercise per Session: 20 min  Stress: No Stress Concern Present (02/28/2023)   [employer] of Occupational Health - Occupational Stress Questionnaire    Feeling of Stress : Only a little  Social Connections: Moderately Integrated (02/28/2023)   Social Connection and Isolation Panel    Frequency of Communication with Friends and Family: More than three times a week    Frequency of Social Gatherings with Friends and Family: More than three times a week    Attends Religious Services: More than 4 times per year    Active Member of [employer] or Organizations: Yes    Attends Banker Meetings: More than 4 times per year    Marital Status: Widowed  Intimate Partner Violence: Not At Risk (02/28/2023)   Humiliation, Afraid, Rape, and Kick questionnaire     Fear of Current or Ex-Partner: No    Emotionally Abused: No    Physically Abused: No    Sexually Abused: No     Constitutional: Denies fever, malaise, fatigue, headache or abrupt weight changes.  HEENT: Denies eye pain, eye redness, ear pain, ringing in the  ears, wax buildup, runny nose, nasal congestion, bloody nose, or sore throat. Respiratory: Denies difficulty breathing, shortness of breath, cough or sputum production.   Cardiovascular: Denies chest pain, chest tightness, palpitations or swelling in the hands or feet.  Gastrointestinal: Patient reports lump in throat and frequent vomiting.  Denies abdominal pain, bloating, constipation, diarrhea or blood in the stool.  GU: Denies urgency, frequency, pain with urination, burning sensation, blood in urine, odor or discharge. Musculoskeletal: Denies decrease in range of motion, difficulty with gait, or joint pain or swelling.  Skin: Denies redness, rashes, lesions or ulcercations.  Neurological: Denies dizziness, difficulty with memory, difficulty with speech or problems with balance and coordination.  Psych: Denies anxiety, depression, SI/HI.  No other specific complaints in a complete review of systems (except as listed in HPI above).     Objective:   Physical Exam BP 130/68 (BP Location: Left Arm, Patient Position: Sitting, Cuff Size: Normal)   Ht 5' 4 (1.626 m)   Wt 167 lb 3.2 oz (75.8 kg)   BMI 28.70 kg/m     Wt Readings from Last 3 Encounters:  03/08/23 171 lb 12.8 oz (77.9 kg)  02/28/23 169 lb (76.7 kg)  08/29/22 174 lb (78.9 kg)    General: Appears her stated age, overweight, in NAD. Skin: Warm, dry and intact.  No ulcerations noted.  HEENT: Head: normal shape and size; Eyes: sclera white, no icterus, conjunctiva pink, PERRLA and EOMs intact;   Cardiovascular: Normal rate and rhythm. S1,S2 noted.  Murmur noted. No JVD. Trace nonpitting BLE edema. No carotid bruits noted. Pulmonary/Chest: Normal effort and positive  vesicular breath sounds. No respiratory distress. No wheezes, rales or ronchi noted.  Abdomen: Soft and nontender. Normal bowel sounds.  Musculoskeletal:  No difficulty with gait.  Neurological: Alert and oriented. Coordination normal.     BMET    Component Value Date/Time   NA 141 02/28/2023 0915   NA 135 (L) 04/09/2013 1025   K 3.9 02/28/2023 0915   K 3.8 04/09/2013 1025   CL 105 02/28/2023 0915   CL 103 04/09/2013 1025   CO2 25 02/28/2023 0915   CO2 28 04/09/2013 1025   GLUCOSE 123 (H) 02/28/2023 0915   GLUCOSE 211 (H) 04/09/2013 1025   BUN 13 02/28/2023 0915   BUN 14 04/09/2013 1025   CREATININE 0.85 02/28/2023 0915   CALCIUM 9.7 02/28/2023 0915   CALCIUM 9.2 04/09/2013 1025   GFRNONAA 58 (L) 11/22/2020 0827   GFRNONAA 58 (L) 11/18/2019 1010   GFRAA 67 11/18/2019 1010    Lipid Panel     Component Value Date/Time   CHOL 147 02/28/2023 0915   TRIG 71 02/28/2023 0915   HDL 64 02/28/2023 0915   CHOLHDL 2.3 02/28/2023 0915   VLDL 23 11/05/2015 0549   LDLCALC 68 02/28/2023 0915    CBC    Component Value Date/Time   WBC 3.1 (L) 02/28/2023 0915   RBC 3.84 02/28/2023 0915   HGB 11.5 (L) 02/28/2023 0915   HGB 14.9 04/09/2013 1025   HCT 35.5 02/28/2023 0915   HCT 44.4 04/09/2013 1025   PLT 187 02/28/2023 0915   PLT 184 04/09/2013 1025   MCV 92.4 02/28/2023 0915   MCV 89 04/09/2013 1025   MCH 29.9 02/28/2023 0915   MCHC 32.4 02/28/2023 0915   RDW 12.7 02/28/2023 0915   RDW 13.5 04/09/2013 1025   LYMPHSABS 1.0 11/22/2020 0827   MONOABS 0.4 11/22/2020 0827   EOSABS 0.1 11/22/2020 0827   BASOSABS  0.0 11/22/2020 0827    Hgb A1C Lab Results  Component Value Date   HGBA1C 6.1 (A) 02/28/2023           Assessment & Plan:     RTC in 6 months for your annual exam Angeline Laura, NP

## 2023-09-25 NOTE — Patient Instructions (Signed)

## 2023-09-25 NOTE — Assessment & Plan Note (Signed)
 A1c and urine microalbumin today Continue metformin  500 mg twice daily Encourage low-carb diet and exercise for weight loss Encourage routine eye exam Encourage routine foot exam Immunizations UTD

## 2023-09-25 NOTE — Assessment & Plan Note (Signed)
 On amlodipine  5 mg twice daily, losartan  50 mg twice daily and labetalol  100 mg 3 times daily Reinforced DASH diet C-Met today

## 2023-09-25 NOTE — Assessment & Plan Note (Signed)
 Continue losartan  50 mg twice daily for renal protection C-Met today

## 2023-09-25 NOTE — Assessment & Plan Note (Signed)
 Encouraged diet and exercise for weight loss ?

## 2023-09-25 NOTE — Assessment & Plan Note (Signed)
 C-Met and lipid profile today Encouraged her to consume a low-fat diet Continue simvastatin  20 mg daily

## 2023-09-26 ENCOUNTER — Ambulatory Visit: Payer: Self-pay | Admitting: Internal Medicine

## 2023-09-26 LAB — COMPREHENSIVE METABOLIC PANEL WITH GFR
AG Ratio: 1.7 (calc) (ref 1.0–2.5)
ALT: 11 U/L (ref 6–29)
AST: 14 U/L (ref 10–35)
Albumin: 4.3 g/dL (ref 3.6–5.1)
Alkaline phosphatase (APISO): 59 U/L (ref 37–153)
BUN: 18 mg/dL (ref 7–25)
CO2: 27 mmol/L (ref 20–32)
Calcium: 9.9 mg/dL (ref 8.6–10.4)
Chloride: 103 mmol/L (ref 98–110)
Creat: 0.94 mg/dL (ref 0.60–0.95)
Globulin: 2.5 g/dL (ref 1.9–3.7)
Glucose, Bld: 109 mg/dL (ref 65–139)
Potassium: 4.8 mmol/L (ref 3.5–5.3)
Sodium: 139 mmol/L (ref 135–146)
Total Bilirubin: 0.6 mg/dL (ref 0.2–1.2)
Total Protein: 6.8 g/dL (ref 6.1–8.1)
eGFR: 58 mL/min/{1.73_m2} — ABNORMAL LOW (ref >=60)

## 2023-09-26 LAB — CBC
HCT: 35.2 % (ref 35.0–45.0)
Hemoglobin: 10.9 g/dL — ABNORMAL LOW (ref 11.7–15.5)
MCH: 28.6 pg (ref 27.0–33.0)
MCHC: 31 g/dL — ABNORMAL LOW (ref 32.0–36.0)
MCV: 92.4 fL (ref 80.0–100.0)
MPV: 12.3 fL (ref 7.5–12.5)
Platelets: 180 Thousand/uL (ref 140–400)
RBC: 3.81 Million/uL (ref 3.80–5.10)
RDW: 12.8 % (ref 11.0–15.0)
WBC: 3.9 10*3/uL (ref 3.8–10.8)

## 2023-09-26 LAB — MICROALBUMIN / CREATININE URINE RATIO
Creatinine, Urine: 46 mg/dL (ref 20–275)
Microalb Creat Ratio: 117 mg/g{creat} — ABNORMAL HIGH (ref <30)
Microalb, Ur: 5.4 mg/dL

## 2023-09-26 LAB — HEMOGLOBIN A1C
Hgb A1c MFr Bld: 6.4 % — ABNORMAL HIGH (ref <5.7)
Mean Plasma Glucose: 137 mg/dL
eAG (mmol/L): 7.6 mmol/L

## 2023-09-26 LAB — LIPID PANEL
Cholesterol: 147 mg/dL (ref <200)
HDL: 55 mg/dL (ref >=50)
LDL Cholesterol (Calc): 74 mg/dL
Non-HDL Cholesterol (Calc): 92 mg/dL (ref <130)
Total CHOL/HDL Ratio: 2.7 (calc) (ref <5.0)
Triglycerides: 98 mg/dL (ref <150)

## 2023-09-27 NOTE — Telephone Encounter (Signed)
 Requested medication (s) are due for refill today: Pharmacy comment: Which set of directions?   Requested medication (s) are on the active medication list: yes  Last refill:  09/25/23  Future visit scheduled: no  Notes to clinic:  Pharmacy comment: Which set of directions?      Requested Prescriptions  Pending Prescriptions Disp Refills   pantoprazole  (PROTONIX ) 40 MG tablet [Pharmacy Med Name: PANTOPRAZOLE  40MG  TAB] 180 tablet 1    Sig: TAKE 1 TABLET BY MOUTH TWICE DAILY .TAKE  1  TABLET  BY  MOUTH  ONCE  DAILY  AT  6  PM     Gastroenterology: Proton Pump Inhibitors Passed - 09/27/2023  8:24 AM      Passed - Valid encounter within last 12 months    Recent Outpatient Visits           2 days ago Type 2 diabetes mellitus with hyperglycemia, without long-term current use of insulin  [facility])   [place] [facility] [place], Angeline [place], [place]

## 2023-10-15 ENCOUNTER — Other Ambulatory Visit: Payer: Self-pay | Admitting: Internal Medicine

## 2023-10-15 DIAGNOSIS — E119 Type 2 diabetes mellitus without complications: Secondary | ICD-10-CM

## 2023-10-17 NOTE — Telephone Encounter (Signed)
 Requested Prescriptions  Pending Prescriptions Disp Refills   metFORMIN  (GLUCOPHAGE ) 500 MG tablet [Pharmacy Med Name: metFORMIN  HCl 500 MG Oral Tablet] 180 tablet 0    Sig: TAKE 1 TABLET BY MOUTH TWICE DAILY WITH A MEAL     Endocrinology:  Diabetes - Biguanides Failed - 10/17/2023  1:01 PM      Failed - eGFR in normal range and within 360 days    GFR, Est African American  Date Value Ref Range Status  11/18/2019 67 > OR = 60 mL/min/1.33m2 Final   GFR, Est Non African American  Date Value Ref Range Status  11/18/2019 58 (L) > OR = 60 mL/min/1.25m2 Final   GFR, Estimated  Date Value Ref Range Status  11/22/2020 58 (L) >60 mL/min Final    Comment:    (NOTE) Calculated using the CKD-EPI Creatinine Equation (2021)    eGFR  Date Value Ref Range Status  09/25/2023 58 (L) > OR = 60 mL/min/1.17m2 Final         Failed - B12 Level in normal range and within 720 days    No results found for: VITAMINB12       Failed - CBC within normal limits and completed in the last 12 months    WBC  Date Value Ref Range Status  09/25/2023 3.9 3.8 - 10.8 Thousand/uL Final   RBC  Date Value Ref Range Status  09/25/2023 3.81 3.80 - 5.10 Million/uL Final   Hemoglobin  Date Value Ref Range Status  09/25/2023 10.9 (L) 11.7 - 15.5 g/dL Final   HGB  Date Value Ref Range Status  04/09/2013 14.9 12.0 - 16.0 g/dL Final   HCT  Date Value Ref Range Status  09/25/2023 35.2 35.0 - 45.0 % Final  04/09/2013 44.4 35.0 - 47.0 % Final   MCHC  Date Value Ref Range Status  09/25/2023 31.0 (L) 32.0 - 36.0 g/dL Final    Comment:    For adults, a slight decrease in the calculated MCHC value (in the range of 30 to 32 g/dL) is most likely not clinically significant; however, it should be interpreted with caution in correlation with other red cell parameters and the patient's clinical condition.    [facility]  Date Value Ref Range Status  09/25/2023 28.6 27.0 - 33.0 pg Final   MCV  Date Value Ref Range  Status  09/25/2023 92.4 80.0 - 100.0 fL Final  04/09/2013 89 80 - 100 fL Final   No results found for: PLTCOUNTKUC, LABPLAT, POCPLA RDW  Date Value Ref Range Status  09/25/2023 12.8 11.0 - 15.0 % Final  04/09/2013 13.5 11.5 - 14.5 % Final         Passed - Cr in normal range and within 360 days    Creat  Date Value Ref Range Status  09/25/2023 0.94 0.60 - 0.95 mg/dL Final   Creatinine, Urine  Date Value Ref Range Status  09/25/2023 46 20 - 275 mg/dL Final         Passed - HBA1C is between 0 and 7.9 and within 180 days    Hgb A1c MFr Bld  Date Value Ref Range Status  09/25/2023 6.4 (H) <5.7 % Final    Comment:    For someone without known diabetes, a hemoglobin  A1c value between 5.7% and 6.4% is consistent with prediabetes and should be confirmed with a  follow-up test. . For someone with known diabetes, a value <7% indicates that their diabetes is well controlled. A1c targets should be  individualized based on duration of diabetes, age, comorbid conditions, and other considerations. . This assay result is consistent with an increased risk of diabetes. . Currently, no consensus exists regarding use of hemoglobin A1c for diagnosis of diabetes for children. Margaret Mcintosh - Valid encounter within last 6 months    Recent Outpatient Visits           3 weeks ago Type 2 diabetes mellitus with hyperglycemia, without long-term current use of insulin  [facility])   [facility] [place], Angeline [place], [place]

## 2023-12-03 ENCOUNTER — Other Ambulatory Visit (INDEPENDENT_AMBULATORY_CARE_PROVIDER_SITE_OTHER): Payer: Self-pay | Admitting: Pharmacist

## 2023-12-03 DIAGNOSIS — I10 Essential (primary) hypertension: Secondary | ICD-10-CM

## 2023-12-03 NOTE — Patient Instructions (Signed)
 Check your blood pressure once weekly, and any time you have concerning symptoms like headache, chest pain, dizziness, shortness of breath, or vision changes.    To appropriately check your blood pressure, make sure you do the following:  1) Avoid caffeine, exercise, or tobacco products for 30 minutes before checking. Empty your bladder. 2) Sit with your back supported in a flat-backed chair. Rest your arm on something flat (arm of the chair, table, etc). 3) Sit still with your feet flat on the floor, resting, for at least 5 minutes.  4) Check your blood pressure. Take 1-2 readings.  5) Write down these readings and bring with you to any provider appointments.  Bring your home blood pressure machine with you to a provider's office for accuracy comparison at least once a year.   Make sure you take your blood pressure medications before you come to any office visit, even if you were asked to fast for labs.

## 2023-12-03 NOTE — Progress Notes (Signed)
   12/03/2023  Patient ID: Margaret Mcintosh [place], female   DOB: 02-Jan-1933, 89 y.o.   MRN: [id number]  This patient is appearing on a report for the adherence measure for hypertension (ACEi/ARB) medications this calendar year.   Medication: losartan  50 mg Last fill date: 08/16/2023 for 90 day supply  From review of chart, note latest prescription filled for losartan  50 mg reflected direction TAKE 1 TABLET BY MOUTH THREE TIMES DAILY WITH MEALS (IN THE MORNING, AT NOON, AND AT BEDTIME  Note at Office Visit on 03/08/2023 PCP changed patient's prescription for losartan  to 50 mg twice daily instead of 3 times daily as 100 g is the max dose for this medication. Patient denies needing a refill at this time as has continued to refill previous prescription (last fill on 5/22 was for quantity of 270), but is taking twice daily as directed. Previous prescription is now out of refills. Patient aware to pick up new prescription with latest directions for next fill.  Recommend to monitor home blood pressure, keep log of results and have this record to review at upcoming medical appointments. Patient to contact provider office sooner if needed for readings outside of established parameters or symptoms  Requests assistance with medication access. Reports PCP increased her pantoprazole  dose to twice daily in July, but that she has been unable to start taking this way as pharmacy advised that it was too soon to refill her prescription (despite provider sending in new prescription with updated directions) - Outreach to [employer] today on behalf of patient. Request prescription for therapy change be processed. Confirms pharmacy will get this prescription ready for pick up today.   Patient plans to follow up with [facility] to be scheduled for her annual flu vaccine  Sharyle Sia, PharmD, [facility] [phone]

## 2023-12-13 DIAGNOSIS — Z961 Presence of intraocular lens: Secondary | ICD-10-CM | POA: Diagnosis not present

## 2023-12-13 DIAGNOSIS — H04123 Dry eye syndrome of bilateral lacrimal glands: Secondary | ICD-10-CM | POA: Diagnosis not present

## 2023-12-13 DIAGNOSIS — E119 Type 2 diabetes mellitus without complications: Secondary | ICD-10-CM | POA: Diagnosis not present

## 2023-12-13 DIAGNOSIS — H353132 Nonexudative age-related macular degeneration, bilateral, intermediate dry stage: Secondary | ICD-10-CM | POA: Diagnosis not present

## 2023-12-13 LAB — HM DIABETES EYE EXAM

## 2023-12-16 ENCOUNTER — Other Ambulatory Visit: Payer: Self-pay | Admitting: Internal Medicine

## 2023-12-16 DIAGNOSIS — I10 Essential (primary) hypertension: Secondary | ICD-10-CM

## 2023-12-17 NOTE — Telephone Encounter (Signed)
 Requested Prescriptions  Pending Prescriptions Disp Refills   labetalol  (NORMODYNE ) 100 MG tablet [Pharmacy Med Name: Labetalol  HCl 100 MG Oral Tablet] 270 tablet 0    Sig: TAKE 1 TABLET BY MOUTH THREE TIMES DAILY     Cardiovascular:  Beta Blockers Passed - 12/17/2023  4:21 PM      Passed - Last BP in normal range    BP Readings from Last 1 Encounters:  09/25/23 130/68         Passed - Last Heart Rate in normal range    Pulse Readings from Last 1 Encounters:  08/29/22 63         Passed - Valid encounter within last 6 months    Recent Outpatient Visits           2 months ago Type 2 diabetes mellitus with hyperglycemia, without long-term current use of insulin  [facility])   [facility] [place], [place], NP               amLODipine  (NORVASC ) 5 MG tablet [Pharmacy Med Name: amLODIPine  Besylate 5 MG Oral Tablet] 180 tablet 0    Sig: Take 1 tablet by mouth twice daily     Cardiovascular: Calcium Channel Blockers 2 Passed - 12/17/2023  4:21 PM      Passed - Last BP in normal range    BP Readings from Last 1 Encounters:  09/25/23 130/68         Passed - Last Heart Rate in normal range    Pulse Readings from Last 1 Encounters:  08/29/22 63         Passed - Valid encounter within last 6 months    Recent Outpatient Visits           2 months ago Type 2 diabetes mellitus with hyperglycemia, without long-term current use of insulin  [facility])   [facility] [place], Angeline ORN, [place]

## 2023-12-20 ENCOUNTER — Other Ambulatory Visit: Payer: Self-pay | Admitting: Internal Medicine

## 2023-12-20 DIAGNOSIS — I10 Essential (primary) hypertension: Secondary | ICD-10-CM

## 2023-12-21 NOTE — Telephone Encounter (Signed)
 Requested Prescriptions  Refused Prescriptions Disp Refills   amLODipine  (NORVASC ) 5 MG tablet [Pharmacy Med Name: amLODIPine  Besylate 5 MG Oral Tablet] 180 tablet 0    Sig: Take 1 tablet by mouth twice daily     Cardiovascular: Calcium Channel Blockers 2 Passed - 12/21/2023  2:28 PM      Passed - Last BP in normal range    BP Readings from Last 1 Encounters:  09/25/23 130/68         Passed - Last Heart Rate in normal range    Pulse Readings from Last 1 Encounters:  08/29/22 63         Passed - Valid encounter within last 6 months    Recent Outpatient Visits           2 months ago Type 2 diabetes mellitus with hyperglycemia, without long-term current use of insulin  [facility])   [facility] [place], [place], NP               labetalol  (NORMODYNE ) 100 MG tablet [Pharmacy Med Name: Labetalol  HCl 100 MG Oral Tablet] 270 tablet 0    Sig: TAKE 1 TABLET BY MOUTH THREE TIMES DAILY     Cardiovascular:  Beta Blockers Passed - 12/21/2023  2:28 PM      Passed - Last BP in normal range    BP Readings from Last 1 Encounters:  09/25/23 130/68         Passed - Last Heart Rate in normal range    Pulse Readings from Last 1 Encounters:  08/29/22 63         Passed - Valid encounter within last 6 months    Recent Outpatient Visits           2 months ago Type 2 diabetes mellitus with hyperglycemia, without long-term current use of insulin  [facility])   [facility] [place], Angeline ORN, [place]

## 2023-12-31 ENCOUNTER — Other Ambulatory Visit: Payer: Self-pay | Admitting: Internal Medicine

## 2023-12-31 DIAGNOSIS — I10 Essential (primary) hypertension: Secondary | ICD-10-CM

## 2024-01-01 NOTE — Telephone Encounter (Signed)
 Requested medications are due for refill today.  Please see note  Requested medications are on the active medications list.  yes  Last refill. 09/25/2023  Future visit scheduled.   yes  Notes to clinic.  Request has pt taking medication 3 times daily. On med list pt takes medication 2 times daily. Please advise.    Requested Prescriptions  Pending Prescriptions Disp Refills   losartan  (COZAAR ) 50 MG tablet [Pharmacy Med Name: Losartan  Potassium 50 MG Oral Tablet] 270 tablet 0    Sig: TAKE 1 TABLET BY MOUTH THREE TIMES DAILY WITH MEALS (IN THE MORNING, AT NOON, AND AT BEDTIME)     Cardiovascular:  Angiotensin Receptor Blockers Passed - 01/01/2024  5:20 PM      Passed - Cr in normal range and within 180 days    Creat  Date Value Ref Range Status  09/25/2023 0.94 0.60 - 0.95 mg/dL Final   Creatinine, Urine  Date Value Ref Range Status  09/25/2023 46 20 - 275 mg/dL Final         Passed - K in normal range and within 180 days    Potassium  Date Value Ref Range Status  09/25/2023 4.8 3.5 - 5.3 mmol/L Final  04/09/2013 3.8 3.5 - 5.1 mmol/L Final         Passed - Patient is not pregnant      Passed - Last BP in normal range    BP Readings from Last 1 Encounters:  09/25/23 130/68         Passed - Valid encounter within last 6 months    Recent Outpatient Visits           3 months ago Type 2 diabetes mellitus with hyperglycemia, without long-term current use of insulin  [facility])   [facility] [place], Angeline ORN, NP

## 2024-02-26 MED ORDER — LOSARTAN POTASSIUM 50 MG PO TABS: 50.0000 mg | ORAL_TABLET | Freq: Two times a day (BID) | ORAL | 1 refills | Status: AC

## 2024-02-26 NOTE — Addendum Note (Signed)
 Addended by: ANTONETTE ANGELINE ORN on: 02/26/2024 08:04 AM   Modules accepted: Orders

## 2024-03-10 NOTE — Progress Notes (Unsigned)
 Subjective:    Patient ID: Margaret Mcintosh, female    DOB: October 29, 1932, 89 y.o.   MRN: [id number]  HPI  Patient presents to clinic today for her annual exam.  Flu: 02/2023 Tetanus: > 10 years ago COVID: x 2 Pneumovax: 03/2013 Prevnar 20: 02/2023 Shingrix: Never Pap smear: Hysterectomy Mammogram: >2 years ago Bone density: >2 years ago Colon screening: No longer screening Vision screening: annually Dentist: biannually  Diet: She does eat some meat. She consumes fruits and veggies. She does eat some fried foods. She drinks mostly water and coffee. Exercise: Walking  Review of Systems     Past Medical History:  Diagnosis Date   Arthritis    Cataract    Complication of anesthesia    takes a long time to wake me up (11/04/2015)   Diabetes mellitus (HCC)    GERD (gastroesophageal reflux disease)    Glaucoma    Hypertension    Stroke (HCC)    last 8/17. Mild left arm/leg weakness   TIA (transient ischemic attack) 11/04/2015    Current Outpatient Medications  Medication Sig Dispense Refill   amLODipine  (NORVASC ) 5 MG tablet Take 1 tablet by mouth twice daily 180 tablet 0   aspirin  325 MG EC tablet Take 1 tablet by mouth daily.     Cholecalciferol (VITAMIN D3 PO) Take by mouth.     Elastic Bandages & Supports (MEDICAL COMPRESSION SOCKS) MISC 2 Devices by Does not apply route daily. 2 each 0   ferrous sulfate 324 MG TBEC Take 324 mg by mouth.     FREESTYLE LITE test strip 1 each by Other route daily. Use to test blood sugar up to 4 times daily. 90 each 4   ibuprofen  (ADVIL ) 800 MG tablet Take 800 mg by mouth every 8 (eight) hours as needed.     labetalol  (NORMODYNE ) 100 MG tablet TAKE 1 TABLET BY MOUTH THREE TIMES DAILY 270 tablet 0   losartan  (COZAAR ) 50 MG tablet Take 1 tablet (50 mg total) by mouth 2 (two) times daily. 180 tablet 1   metFORMIN  (GLUCOPHAGE ) 500 MG tablet TAKE 1 TABLET BY MOUTH TWICE DAILY WITH A MEAL 180 tablet 1   Multiple Vitamin (MULTIVITAMIN)  tablet Take 1 tablet by mouth daily.     Multiple Vitamins-Minerals (GNP ONE DAILY MENS/LYCOPENE) TABS      Omega-3 Fatty Acids (FISH OIL) 1200 MG CAPS Take by mouth 3 (three) times daily.     pantoprazole  (PROTONIX ) 40 MG tablet Take 1 tablet (40 mg total) by mouth 2 (two) times daily. 180 tablet 1   simvastatin  (ZOCOR ) 20 MG tablet Take 1 tablet (20 mg total) by mouth at bedtime. 90 tablet 1   No current facility-administered medications for this visit.    Allergies  Allergen Reactions   Codeine Other (See Comments)    Gets nausea and feels likesomething is crawling somethings crawling in my head    Diphenhydramine Hcl Other (See Comments)    Feels like skin crawling somethings crawling in my head    Other Other (See Comments)    CANNOT HAVE ANY SPICY FOODS!!    Family History  Problem Relation Age of Onset   Hypertension Mother    Diabetes Mother    Heart disease Mother    Cancer Sister    Diabetes Brother    Hypertension Daughter    Other Son        Pituitary gland disease   Hypertension Son    Obesity Son  Colon cancer Brother    Diabetes Brother    Alzheimer's disease Brother    Diabetes Sister     Social History   Socioeconomic History   Marital status: Widowed    Spouse name: Not on file   Number of children: 2   Years of education: Not on file   Highest education level: Not on file  Occupational History   Occupation: Retired  Tobacco Use   Smoking status: Never   Smokeless tobacco: Never  Vaping Use   Vaping status: Never Used  Substance and Sexual Activity   Alcohol use: No   Drug use: No   Sexual activity: Not Currently  Other Topics Concern   Not on file  Social History Narrative   Not on file   Social Drivers of Health   Tobacco Use: Low Risk (09/25/2023)   Patient History    Smoking Tobacco Use: Never    Smokeless Tobacco Use: Never    Passive Exposure: Not on file  Financial Resource Strain: Medium Risk (02/28/2023)    Overall Financial Resource Strain (CARDIA)    Difficulty of Paying Living Expenses: Somewhat hard  Food Insecurity: Food Insecurity Present (02/28/2023)   Hunger Vital Sign    Worried About Running Out of Food in the Last Year: Never true    Ran Out of Food in the Last Year: Sometimes true  Transportation Needs: No Transportation Needs (02/28/2023)   PRAPARE - Administrator, Civil Service (Medical): No    Lack of Transportation (Non-Medical): No  Physical Activity: Insufficiently Active (02/28/2023)   Exercise Vital Sign    Days of Exercise per Week: 1 day    Minutes of Exercise per Session: 20 min  Stress: No Stress Concern Present (02/28/2023)   [employer] of Occupational Health - Occupational Stress Questionnaire    Feeling of Stress : Only a little  Social Connections: Moderately Integrated (02/28/2023)   Social Connection and Isolation Panel    Frequency of Communication with Friends and Family: More than three times a week    Frequency of Social Gatherings with Friends and Family: More than three times a week    Attends Religious Services: More than 4 times per year    Active Member of [employer] or Organizations: Yes    Attends Banker Meetings: More than 4 times per year    Marital Status: Widowed  Intimate Partner Violence: Not At Risk (02/28/2023)   Humiliation, Afraid, Rape, and Kick questionnaire    Fear of Current or Ex-Partner: No    Emotionally Abused: No    Physically Abused: No    Sexually Abused: No  Depression (PHQ2-9): Low Risk (09/25/2023)   Depression (PHQ2-9)    PHQ-2 Score: 1  Alcohol Screen: Low Risk (08/10/2022)   Alcohol Screen    Last Alcohol Screening Score (AUDIT): 0  Housing: Medium Risk (02/28/2023)   Housing    Last Housing Risk Score: 1  Utilities: Not At Risk (02/28/2023)   AHC Utilities    Threatened with loss of utilities: No  Health Literacy: Adequate Health Literacy (02/28/2023)   B1300 Health Literacy    Frequency of  need for help with medical instructions: Never     Constitutional: Denies fever, malaise, fatigue, headache or abrupt weight changes.  HEENT: Denies eye pain, eye redness, ear pain, ringing in the ears, wax buildup, runny nose, nasal congestion, bloody nose, or sore throat. Respiratory: Denies difficulty breathing, shortness of breath, cough or sputum production.  Cardiovascular: Denies chest pain, chest tightness, palpitations or swelling in the hands or feet.  Gastrointestinal: Denies abdominal pain, bloating, constipation, diarrhea or blood in the stool.  GU: Pt reports urgency. Denies frequency, pain with urination, burning sensation, blood in urine, odor or discharge. Musculoskeletal: Denies decrease in range of motion, difficulty with gait, muscle pain or joint pain and swelling.  Skin: Denies redness, rashes, lesions or ulcercations.  Neurological: Denies dizziness, difficulty with memory, difficulty with speech or problems with balance and coordination.  Psych: Denies anxiety, depression, SI/HI.  No other specific complaints in a complete review of systems (except as listed in HPI above).  Objective:   Physical Exam   There were no vitals taken for this visit.  Wt Readings from Last 3 Encounters:  09/25/23 167 lb 3.2 oz (75.8 kg)  03/08/23 171 lb 12.8 oz (77.9 kg)  02/28/23 169 lb (76.7 kg)    General: Appears her stated age, overweight, in NAD. Skin: Warm, dry and intact. Scabbed area noted to left lower leg just above ankle. HEENT: Head: normal shape and size; Eyes: sclera white, no icterus, conjunctiva pink, PERRLA and EOMs intact;  Neck:  Neck supple, trachea midline. No masses, lumps or thyromegaly present.  Cardiovascular: Normal rate and rhythm. S1,S2 noted.  No murmur, rubs or gallops noted. No JVD or BLE edema. No carotid bruits noted. Pulmonary/Chest: Normal effort and positive vesicular breath sounds. No respiratory distress. No wheezes, rales or ronchi noted.   Abdomen: Normal bowel sounds.  Musculoskeletal: Strength 5/5BUE/BLE. No difficulty with gait.  Neurological: Alert and oriented. Cranial nerves II-XII grossly intact. Coordination normal.  Psychiatric: Mood and affect normal. Behavior is normal. Judgment and thought content normal.    BMET    Component Value Date/Time   NA 139 09/25/2023 1108   NA 135 (L) 04/09/2013 1025   K 4.8 09/25/2023 1108   K 3.8 04/09/2013 1025   CL 103 09/25/2023 1108   CL 103 04/09/2013 1025   CO2 27 09/25/2023 1108   CO2 28 04/09/2013 1025   GLUCOSE 109 09/25/2023 1108   GLUCOSE 211 (H) 04/09/2013 1025   BUN 18 09/25/2023 1108   BUN 14 04/09/2013 1025   CREATININE 0.94 09/25/2023 1108   CALCIUM 9.9 09/25/2023 1108   CALCIUM 9.2 04/09/2013 1025   GFRNONAA 58 (L) 11/22/2020 0827   GFRNONAA 58 (L) 11/18/2019 1010   GFRAA 67 11/18/2019 1010    Lipid Panel     Component Value Date/Time   CHOL 147 09/25/2023 1108   TRIG 98 09/25/2023 1108   HDL 55 09/25/2023 1108   CHOLHDL 2.7 09/25/2023 1108   VLDL 23 11/05/2015 0549   LDLCALC 74 09/25/2023 1108    CBC    Component Value Date/Time   WBC 3.9 09/25/2023 1108   RBC 3.81 09/25/2023 1108   HGB 10.9 (L) 09/25/2023 1108   HGB 14.9 04/09/2013 1025   HCT 35.2 09/25/2023 1108   HCT 44.4 04/09/2013 1025   PLT 180 09/25/2023 1108   PLT 184 04/09/2013 1025   MCV 92.4 09/25/2023 1108   MCV 89 04/09/2013 1025   MCH 28.6 09/25/2023 1108   MCHC 31.0 (L) 09/25/2023 1108   RDW 12.8 09/25/2023 1108   RDW 13.5 04/09/2013 1025   LYMPHSABS 1.0 11/22/2020 0827   MONOABS 0.4 11/22/2020 0827   EOSABS 0.1 11/22/2020 0827   BASOSABS 0.0 11/22/2020 0827    Hgb A1C Lab Results  Component Value Date   HGBA1C 6.4 (H) 09/25/2023  Assessment & Plan:   Preventative Health Maintenance:  Flu shot She declines tetanus for financial reasons, advised her if she gets bit or cut to go get this done She declines Prevnar 20 and Pneumovax  UTD Discussed Shingrix vaccine, she will check coverage with her insurance company and schedule a visit if she would like to have this done She no longer needs Pap smears She does not want to screen for breast cancer She does not want to screen for osteoporosis She no longer wants to screen for colon cancer Encouraged her to consume a balanced diet and exercise regimen Advised her to see and eye doctor and dentist annually We will check CBC, c-Met, lipid, A1c and urine microalbumin today  RTC in 6 months, follow-up chronic conditions Angeline Laura, NP

## 2024-03-11 ENCOUNTER — Ambulatory Visit: Admitting: Internal Medicine

## 2024-03-11 ENCOUNTER — Encounter: Payer: Self-pay | Admitting: Internal Medicine

## 2024-03-11 VITALS — BP 130/64 | Ht 64.0 in | Wt 172.6 lb

## 2024-03-11 DIAGNOSIS — Z0001 Encounter for general adult medical examination with abnormal findings: Secondary | ICD-10-CM

## 2024-03-11 DIAGNOSIS — E1165 Type 2 diabetes mellitus with hyperglycemia: Secondary | ICD-10-CM | POA: Diagnosis not present

## 2024-03-11 DIAGNOSIS — R1319 Other dysphagia: Secondary | ICD-10-CM | POA: Diagnosis not present

## 2024-03-11 DIAGNOSIS — Z23 Encounter for immunization: Secondary | ICD-10-CM

## 2024-03-11 NOTE — Assessment & Plan Note (Signed)
-

## 2024-03-11 NOTE — Patient Instructions (Signed)
 Health Maintenance for Postmenopausal Women Menopause is a normal process in which your ability to get pregnant comes to an end. This process happens slowly over many months or years, usually between the ages of 76 and 38. Menopause is complete when you have missed your menstrual period for 12 months. It is important to talk with your health care provider about some of the most common conditions that affect women after menopause (postmenopausal women). These include heart disease, cancer, and bone loss (osteoporosis). Adopting a healthy lifestyle and getting preventive care can help to promote your health and wellness. The actions you take can also lower your chances of developing some of these common conditions. What are the signs and symptoms of menopause? During menopause, you may have the following symptoms: Hot flashes. These can be moderate or severe. Night sweats. Decrease in sex drive. Mood swings. Headaches. Tiredness (fatigue). Irritability. Memory problems. Problems falling asleep or staying asleep. Talk with your health care provider about treatment options for your symptoms. Do I need hormone replacement therapy? Hormone replacement therapy is effective in treating symptoms that are caused by menopause, such as hot flashes and night sweats. Hormone replacement carries certain risks, especially as you become older. If you are thinking about using estrogen or estrogen with progestin, discuss the benefits and risks with your health care provider. How can I reduce my risk for heart disease and stroke? The risk of heart disease, heart attack, and stroke increases as you age. One of the causes may be a change in the body's hormones during menopause. This can affect how your body uses dietary fats, triglycerides, and cholesterol. Heart attack and stroke are medical emergencies. There are many things that you can do to help prevent heart disease and stroke. Watch your blood pressure High  blood pressure causes heart disease and increases the risk of stroke. This is more likely to develop in people who have high blood pressure readings or are overweight. Have your blood pressure checked: Every 3-5 years if you are 32-23 years of age. Every year if you are 31 years old or older. Eat a healthy diet  Eat a diet that includes plenty of vegetables, fruits, low-fat dairy products, and lean protein. Do not eat a lot of foods that are high in solid fats, added sugars, or sodium. Get regular exercise Get regular exercise. This is one of the most important things you can do for your health. Most adults should: Try to exercise for at least 150 minutes each week. The exercise should increase your heart rate and make you sweat (moderate-intensity exercise). Try to do strengthening exercises at least twice each week. Do these in addition to the moderate-intensity exercise. Spend less time sitting. Even light physical activity can be beneficial. Other tips Work with your health care provider to achieve or maintain a healthy weight. Do not use any products that contain nicotine or tobacco. These products include cigarettes, chewing tobacco, and vaping devices, such as e-cigarettes. If you need help quitting, ask your health care provider. Know your numbers. Ask your health care provider to check your cholesterol and your blood sugar (glucose). Continue to have your blood tested as directed by your health care provider. Do I need screening for cancer? Depending on your health history and family history, you may need to have cancer screenings at different stages of your life. This may include screening for: Breast cancer. Cervical cancer. Lung cancer. Colorectal cancer. What is my risk for osteoporosis? After menopause, you may be  at increased risk for osteoporosis. Osteoporosis is a condition in which bone destruction happens more quickly than new bone creation. To help prevent osteoporosis or  the bone fractures that can happen because of osteoporosis, you may take the following actions: If you are 24-54 years old, get at least 1,000 mg of calcium and at least 600 international units (IU) of vitamin D  per day. If you are older than age 75 but younger than age 30, get at least 1,200 mg of calcium and at least 600 international units (IU) of vitamin D  per day. If you are older than age 8, get at least 1,200 mg of calcium and at least 800 international units (IU) of vitamin D  per day. Smoking and drinking excessive alcohol increase the risk of osteoporosis. Eat foods that are rich in calcium and vitamin D , and do weight-bearing exercises several times each week as directed by your health care provider. How does menopause affect my mental health? Depression may occur at any age, but it is more common as you become older. Common symptoms of depression include: Feeling depressed. Changes in sleep patterns. Changes in appetite or eating patterns. Feeling an overall lack of motivation or enjoyment of activities that you previously enjoyed. Frequent crying spells. Talk with your health care provider if you think that you are experiencing any of these symptoms. General instructions See your health care provider for regular wellness exams and vaccines. This may include: Scheduling regular health, dental, and eye exams. Getting and maintaining your vaccines. These include: Influenza vaccine. Get this vaccine each year before the flu season begins. Pneumonia vaccine. Shingles vaccine. Tetanus, diphtheria, and pertussis (Tdap) booster vaccine. Your health care provider may also recommend other immunizations. Tell your health care provider if you have ever been abused or do not feel safe at home. Summary Menopause is a normal process in which your ability to get pregnant comes to an end. This condition causes hot flashes, night sweats, decreased interest in sex, mood swings, headaches, or lack  of sleep. Treatment for this condition may include hormone replacement therapy. Take actions to keep yourself healthy, including exercising regularly, eating a healthy diet, watching your weight, and checking your blood pressure and blood sugar levels. Get screened for cancer and depression. Make sure that you are up to date with all your vaccines. This information is not intended to replace advice given to you by your health care provider. Make sure you discuss any questions you have with your health care provider. Document Revised: 08/02/2020 Document Reviewed: 08/02/2020 Elsevier Patient Education  2024 [employer].

## 2024-03-12 ENCOUNTER — Ambulatory Visit: Payer: Self-pay | Admitting: Internal Medicine

## 2024-03-12 LAB — LIPID PANEL
Cholesterol: 142 mg/dL (ref <200)
HDL: 61 mg/dL (ref >=50)
LDL Cholesterol (Calc): 65 mg/dL
Non-HDL Cholesterol (Calc): 81 mg/dL (ref <130)
Total CHOL/HDL Ratio: 2.3 (calc) (ref <5.0)
Triglycerides: 76 mg/dL (ref <150)

## 2024-03-12 LAB — CBC
HCT: 35.4 % — ABNORMAL LOW (ref 35.9–46.0)
Hemoglobin: 11.2 g/dL — ABNORMAL LOW (ref 11.7–15.5)
MCH: 28.9 pg (ref 27.0–33.0)
MCHC: 31.6 g/dL (ref 31.6–35.4)
MCV: 91.2 fL (ref 81.4–101.7)
MPV: 12.5 fL (ref 7.5–12.5)
Platelets: 180 Thousand/uL (ref 140–400)
RBC: 3.88 Million/uL (ref 3.80–5.10)
RDW: 12.7 % (ref 11.0–15.0)
WBC: 3.6 Thousand/uL — ABNORMAL LOW (ref 3.8–10.8)

## 2024-03-12 LAB — COMPREHENSIVE METABOLIC PANEL WITH GFR
AG Ratio: 1.5 (calc) (ref 1.0–2.5)
ALT: 8 U/L (ref 6–29)
AST: 12 U/L (ref 10–35)
Albumin: 4.2 g/dL (ref 3.6–5.1)
Alkaline phosphatase (APISO): 63 U/L (ref 37–153)
BUN: 15 mg/dL (ref 7–25)
CO2: 28 mmol/L (ref 20–32)
Calcium: 9.6 mg/dL (ref 8.6–10.4)
Chloride: 104 mmol/L (ref 98–110)
Creat: 0.95 mg/dL (ref 0.60–0.95)
Globulin: 2.8 g/dL (ref 1.9–3.7)
Glucose, Bld: 134 mg/dL — ABNORMAL HIGH (ref 65–99)
Potassium: 4.8 mmol/L (ref 3.5–5.3)
Sodium: 140 mmol/L (ref 135–146)
Total Bilirubin: 0.7 mg/dL (ref 0.2–1.2)
Total Protein: 7 g/dL (ref 6.1–8.1)
eGFR: 57 mL/min/1.73m2 — ABNORMAL LOW (ref >=60)

## 2024-03-12 LAB — HEMOGLOBIN A1C
Hgb A1c MFr Bld: 6.2 % — ABNORMAL HIGH (ref <5.7)
Mean Plasma Glucose: 131 mg/dL
eAG (mmol/L): 7.3 mmol/L

## 2024-04-08 ENCOUNTER — Other Ambulatory Visit: Payer: Self-pay | Admitting: Internal Medicine

## 2024-04-08 DIAGNOSIS — I10 Essential (primary) hypertension: Secondary | ICD-10-CM

## 2024-04-08 DIAGNOSIS — E119 Type 2 diabetes mellitus without complications: Secondary | ICD-10-CM

## 2024-04-09 NOTE — Telephone Encounter (Signed)
 Requested by interface surescripts. Future visit 09/11/24.  Requested Prescriptions  Pending Prescriptions Disp Refills   amLODipine  (NORVASC ) 5 MG tablet [Pharmacy Med Name: amLODIPine  Besylate 5 MG Oral Tablet] 180 tablet 0    Sig: Take 1 tablet by mouth twice daily     Cardiovascular: Calcium Channel Blockers 2 Passed - 04/09/2024 11:55 AM      Passed - Last BP in normal range    BP Readings from Last 1 Encounters:  03/11/24 130/64         Passed - Last Heart Rate in normal range    Pulse Readings from Last 1 Encounters:  08/29/22 63         Passed - Valid encounter within last 6 months    Recent Outpatient Visits           4 weeks ago Encounter for general adult medical examination with abnormal findings   [place] [facility] [place], Angeline ORN, NP   6 months ago Type 2 diabetes mellitus with hyperglycemia, without long-term current use of insulin  [facility])   [place] [facility], Angeline ORN, NP               metFORMIN  (GLUCOPHAGE ) 500 MG tablet [Pharmacy Med Name: metFORMIN  HCl 500 MG Oral Tablet] 180 tablet 0    Sig: TAKE 1 TABLET BY MOUTH TWICE DAILY WITH A MEAL     Endocrinology:  Diabetes - Biguanides Failed - 04/09/2024 11:55 AM      Failed - eGFR in normal range and within 360 days    GFR, Est African American  Date Value Ref Range Status  11/18/2019 67 > OR = 60 mL/min/1.62m2 Final   GFR, Est Non African American  Date Value Ref Range Status  11/18/2019 58 (L) > OR = 60 mL/min/1.54m2 Final   GFR, Estimated  Date Value Ref Range Status  11/22/2020 58 (L) >60 mL/min Final    Comment:    (NOTE) Calculated using the CKD-EPI Creatinine Equation (2021)    eGFR  Date Value Ref Range Status  03/11/2024 57 (L) > OR = 60 mL/min/1.63m2 Final         Failed - B12 Level in normal range and within 720 days    No results found for: VITAMINB12       Failed - CBC within normal limits and completed in the last 12 months     WBC  Date Value Ref Range Status  03/11/2024 3.6 (L) 3.8 - 10.8 Thousand/uL Final   RBC  Date Value Ref Range Status  03/11/2024 3.88 3.80 - 5.10 Million/uL Final   Hemoglobin  Date Value Ref Range Status  03/11/2024 11.2 (L) 11.7 - 15.5 g/dL Final   HGB  Date Value Ref Range Status  04/09/2013 14.9 12.0 - 16.0 g/dL Final   HCT  Date Value Ref Range Status  03/11/2024 35.4 (L) 35.9 - 46.0 % Final  04/09/2013 44.4 35.0 - 47.0 % Final   MCHC  Date Value Ref Range Status  03/11/2024 31.6 31.6 - 35.4 g/dL Final   [facility]  Date Value Ref Range Status  03/11/2024 28.9 27.0 - 33.0 pg Final   MCV  Date Value Ref Range Status  03/11/2024 91.2 81.4 - 101.7 fL Final  04/09/2013 89 80 - 100 fL Final   No results found for: PLTCOUNTKUC, LABPLAT, POCPLA RDW  Date Value Ref Range Status  03/11/2024 12.7 11.0 - 15.0 % Final  04/09/2013 13.5 11.5 -  14.5 % Final         Passed - Cr in normal range and within 360 days    Creat  Date Value Ref Range Status  03/11/2024 0.95 0.60 - 0.95 mg/dL Final   Creatinine, Urine  Date Value Ref Range Status  09/25/2023 46 20 - 275 mg/dL Final         Passed - HBA1C is between 0 and 7.9 and within 180 days    Hgb A1c MFr Bld  Date Value Ref Range Status  03/11/2024 6.2 (H) <5.7 % Final    Comment:    For someone without known diabetes, a hemoglobin  A1c value between 5.7% and 6.4% is consistent with prediabetes and should be confirmed with a  follow-up test. . For someone with known diabetes, a value <7% indicates that their diabetes is well controlled. A1c targets should be individualized based on duration of diabetes, age, comorbid conditions, and other considerations. . This assay result is consistent with an increased risk of diabetes. . Currently, no consensus exists regarding use of hemoglobin A1c for diagnosis of diabetes for children. SABRA Amy - Valid encounter within last 6 months    Recent Outpatient  Visits           4 weeks ago Encounter for general adult medical examination with abnormal findings   [place] [facility] [place], [place] W, NP   6 months ago Type 2 diabetes mellitus with hyperglycemia, without long-term current use of insulin  [facility])   [place] [facility] [place], Angeline ORN, [place]

## 2024-04-23 ENCOUNTER — Other Ambulatory Visit: Payer: Self-pay | Admitting: Internal Medicine

## 2024-04-23 DIAGNOSIS — I10 Essential (primary) hypertension: Secondary | ICD-10-CM

## 2024-04-24 NOTE — Telephone Encounter (Signed)
 Requested Prescriptions  Pending Prescriptions Disp Refills   labetalol  (NORMODYNE ) 100 MG tablet [Pharmacy Med Name: Labetalol  HCl 100 MG Oral Tablet] 270 tablet 1    Sig: TAKE 1 TABLET BY MOUTH THREE TIMES DAILY     Cardiovascular:  Beta Blockers Passed - 04/24/2024 12:00 PM      Passed - Last BP in normal range    BP Readings from Last 1 Encounters:  03/11/24 130/64         Passed - Last Heart Rate in normal range    Pulse Readings from Last 1 Encounters:  08/29/22 63         Passed - Valid encounter within last 6 months    Recent Outpatient Visits           1 month ago Encounter for general adult medical examination with abnormal findings   [facility] [place], [place] W, NP   7 months ago Type 2 diabetes mellitus with hyperglycemia, without long-term current use of insulin  [facility])   [place] [facility] [place], Angeline ORN, [place]

## 2024-07-03 ENCOUNTER — Ambulatory Visit: Admitting: Gastroenterology

## 2024-09-11 ENCOUNTER — Ambulatory Visit: Admitting: Internal Medicine
